# Patient Record
Sex: Female | Born: 1935 | Race: White | Hispanic: No | Smoking: Former smoker
Health system: Southern US, Community
[De-identification: ages and names within clinical notes are randomized; demographics above are authoritative.]

## PROBLEM LIST (undated history)

## (undated) DIAGNOSIS — Z85828 Personal history of other malignant neoplasm of skin: Secondary | ICD-10-CM

## (undated) DIAGNOSIS — Z923 Personal history of irradiation: Secondary | ICD-10-CM

## (undated) DIAGNOSIS — Z9889 Other specified postprocedural states: Secondary | ICD-10-CM

## (undated) DIAGNOSIS — R0602 Shortness of breath: Secondary | ICD-10-CM

## (undated) DIAGNOSIS — M1990 Unspecified osteoarthritis, unspecified site: Secondary | ICD-10-CM

## (undated) DIAGNOSIS — J45909 Unspecified asthma, uncomplicated: Secondary | ICD-10-CM

## (undated) DIAGNOSIS — E7800 Pure hypercholesterolemia, unspecified: Secondary | ICD-10-CM

## (undated) DIAGNOSIS — M5136 Other intervertebral disc degeneration, lumbar region: Secondary | ICD-10-CM

## (undated) DIAGNOSIS — M51369 Other intervertebral disc degeneration, lumbar region without mention of lumbar back pain or lower extremity pain: Secondary | ICD-10-CM

## (undated) DIAGNOSIS — J449 Chronic obstructive pulmonary disease, unspecified: Secondary | ICD-10-CM

## (undated) DIAGNOSIS — C50919 Malignant neoplasm of unspecified site of unspecified female breast: Secondary | ICD-10-CM

## (undated) DIAGNOSIS — Z9289 Personal history of other medical treatment: Secondary | ICD-10-CM

## (undated) DIAGNOSIS — G629 Polyneuropathy, unspecified: Secondary | ICD-10-CM

## (undated) DIAGNOSIS — R112 Nausea with vomiting, unspecified: Secondary | ICD-10-CM

## (undated) DIAGNOSIS — G479 Sleep disorder, unspecified: Secondary | ICD-10-CM

## (undated) HISTORY — PX: TOTAL HIP ARTHROPLASTY: SHX124

## (undated) HISTORY — DX: Malignant neoplasm of unspecified site of unspecified female breast: C50.919

## (undated) HISTORY — DX: Unspecified osteoarthritis, unspecified site: M19.90

## (undated) HISTORY — DX: Personal history of irradiation: Z92.3

## (undated) HISTORY — PX: OTHER SURGICAL HISTORY: SHX169

## (undated) HISTORY — PX: TONSILLECTOMY: SUR1361

---

## 1998-04-05 ENCOUNTER — Encounter: Admission: RE | Admit: 1998-04-05 | Discharge: 1998-07-04 | Payer: Self-pay | Admitting: Internal Medicine

## 1998-06-14 ENCOUNTER — Other Ambulatory Visit: Admission: RE | Admit: 1998-06-14 | Discharge: 1998-06-14 | Payer: Self-pay | Admitting: Internal Medicine

## 1999-07-15 ENCOUNTER — Other Ambulatory Visit: Admission: RE | Admit: 1999-07-15 | Discharge: 1999-07-15 | Payer: Self-pay | Admitting: Internal Medicine

## 1999-09-07 ENCOUNTER — Ambulatory Visit (HOSPITAL_COMMUNITY): Admission: RE | Admit: 1999-09-07 | Discharge: 1999-09-07 | Payer: Self-pay | Admitting: Gastroenterology

## 2000-07-16 ENCOUNTER — Other Ambulatory Visit: Admission: RE | Admit: 2000-07-16 | Discharge: 2000-07-16 | Payer: Self-pay | Admitting: Internal Medicine

## 2002-12-25 HISTORY — PX: COLONOSCOPY W/ POLYPECTOMY: SHX1380

## 2008-12-25 HISTORY — PX: EYE SURGERY: SHX253

## 2009-07-30 ENCOUNTER — Encounter
Admission: RE | Admit: 2009-07-30 | Discharge: 2009-07-30 | Payer: Self-pay | Admitting: Physical Medicine and Rehabilitation

## 2009-08-13 ENCOUNTER — Encounter
Admission: RE | Admit: 2009-08-13 | Discharge: 2009-08-13 | Payer: Self-pay | Admitting: Physical Medicine and Rehabilitation

## 2013-04-08 ENCOUNTER — Other Ambulatory Visit: Payer: Self-pay | Admitting: Radiology

## 2013-04-10 ENCOUNTER — Other Ambulatory Visit: Payer: Self-pay | Admitting: *Deleted

## 2013-04-10 ENCOUNTER — Telehealth: Payer: Self-pay | Admitting: *Deleted

## 2013-04-10 DIAGNOSIS — C50412 Malignant neoplasm of upper-outer quadrant of left female breast: Secondary | ICD-10-CM

## 2013-04-10 DIAGNOSIS — C50419 Malignant neoplasm of upper-outer quadrant of unspecified female breast: Secondary | ICD-10-CM | POA: Insufficient documentation

## 2013-04-10 NOTE — Telephone Encounter (Signed)
Confirmed [facility] for 04/16/13 at 0800 .  Instructions and contact information given.

## 2013-04-16 ENCOUNTER — Encounter: Payer: Self-pay | Admitting: *Deleted

## 2013-04-16 ENCOUNTER — Ambulatory Visit (HOSPITAL_BASED_OUTPATIENT_CLINIC_OR_DEPARTMENT_OTHER): Payer: Medicare Other | Admitting: Oncology

## 2013-04-16 ENCOUNTER — Other Ambulatory Visit: Payer: Self-pay | Admitting: Lab

## 2013-04-16 ENCOUNTER — Ambulatory Visit: Payer: Self-pay

## 2013-04-16 ENCOUNTER — Other Ambulatory Visit (INDEPENDENT_AMBULATORY_CARE_PROVIDER_SITE_OTHER): Payer: Self-pay | Admitting: General Surgery

## 2013-04-16 ENCOUNTER — Encounter (HOSPITAL_COMMUNITY): Payer: Self-pay | Admitting: Pharmacy Technician

## 2013-04-16 ENCOUNTER — Encounter: Payer: Self-pay | Admitting: Oncology

## 2013-04-16 ENCOUNTER — Ambulatory Visit: Payer: Medicare Other | Admitting: Physical Therapy

## 2013-04-16 ENCOUNTER — Encounter (INDEPENDENT_AMBULATORY_CARE_PROVIDER_SITE_OTHER): Payer: Self-pay | Admitting: General Surgery

## 2013-04-16 ENCOUNTER — Ambulatory Visit (HOSPITAL_BASED_OUTPATIENT_CLINIC_OR_DEPARTMENT_OTHER): Payer: Self-pay | Admitting: General Surgery

## 2013-04-16 ENCOUNTER — Ambulatory Visit
Admission: RE | Admit: 2013-04-16 | Discharge: 2013-04-16 | Disposition: A | Discharge status: Home / Self Care | Payer: Medicare Other | Source: Ambulatory Visit | Attending: Radiation Oncology | Admitting: Radiation Oncology

## 2013-04-16 ENCOUNTER — Ambulatory Visit: Payer: Self-pay | Admitting: Oncology

## 2013-04-16 VITALS — BP 182/100 | HR 94 | Temp 97.7°F | Resp 20 | Ht 61.0 in | Wt 138.2 lb

## 2013-04-16 DIAGNOSIS — C50412 Malignant neoplasm of upper-outer quadrant of left female breast: Secondary | ICD-10-CM

## 2013-04-16 DIAGNOSIS — C50419 Malignant neoplasm of upper-outer quadrant of unspecified female breast: Secondary | ICD-10-CM

## 2013-04-16 DIAGNOSIS — J449 Chronic obstructive pulmonary disease, unspecified: Secondary | ICD-10-CM | POA: Insufficient documentation

## 2013-04-16 DIAGNOSIS — J4489 Other specified chronic obstructive pulmonary disease: Secondary | ICD-10-CM

## 2013-04-16 NOTE — Progress Notes (Signed)
Patient ID: Alexandra Snyder, female   DOB: 02/16/1934, 79 y.o.   MRN: [id number]  Chief Complaint  Patient presents with  . Other    breast cancer    HPI Alexandra Snyder is a 79 y.o. female.  Referred by Dr Alexandra Snyder HPI 79 yof who has no prior history of breast complaints who presents after undergoing screening mm that shows area of architectural distortion on the left side.  No changes are noted on the right side.  Ultrasound shows a hypoechoic mass in 1230 position 6 cm from nipple that measures 1.2 cm in diameter.  U/S guided biopsy was performed that shows invasive ductal carcinoma, likely grade II, er positive at 100, pr negative, Ki67 10 and her2 is not amplified.  She comes in today to discuss options.  She does have COPD with prior smoking history and has some shortness of breath when ambulating. She does use proair inhaler daily right now. Past Medical History  Diagnosis Date  . Breast cancer   . Arthritis     History reviewed. No pertinent past surgical history.  Family History  Problem Relation Age of Onset  . Ovarian cancer Sister     Social History History  Substance Use Topics  . Smoking status: Former Smoker  . Smokeless tobacco: Not on file  . Alcohol Use: No    Allergies  Allergen Reactions  . Celebrex (Celecoxib) Itching    Hands itch    Current Outpatient Prescriptions  Medication Sig Dispense Refill  . Albuterol Sulfate (PROAIR HFA IN) Inhale 8.5 g into the lungs 4 (four) times daily. 2 puffs 4 times a day      . Ibuprofen (ADVIL) 200 MG CAPS Take by mouth. Per patient 4 a day      . Mometasone Furo-Formoterol Fum (DULERA IN) Inhale 13 g into the lungs 2 (two) times daily. 1 inhalation twice a day      . OVER THE COUNTER MEDICATION Take by mouth 2 (two) times daily. Vitamin D      . rosuvastatin (CRESTOR) 20 MG tablet Take 20 mg by mouth daily.       No current facility-administered medications for this visit.    Review of Systems Review of  Systems  Constitutional: Negative for fever, chills and unexpected weight change.  HENT: Negative for hearing loss, congestion, sore throat, trouble swallowing and voice change.   Eyes: Negative for visual disturbance.  Respiratory: Positive for shortness of breath. Negative for cough and wheezing.   Cardiovascular: Negative for chest pain, palpitations and leg swelling.  Gastrointestinal: Negative for nausea, vomiting, abdominal pain, diarrhea, constipation, blood in stool, abdominal distention and anal bleeding.  Genitourinary: Negative for hematuria, vaginal bleeding and difficulty urinating.  Musculoskeletal: Negative for arthralgias.  Skin: Negative for rash and wound.  Neurological: Negative for seizures, syncope and headaches.  Hematological: Negative for adenopathy. Does not bruise/bleed easily.  Psychiatric/Behavioral: Negative for confusion.    There were no vitals taken for this visit.  Physical Exam Physical Exam  Vitals reviewed. Constitutional: She appears well-developed and well-nourished.  Eyes: No scleral icterus.  Neck: Neck supple.  Cardiovascular: Normal rate, regular rhythm and normal heart sounds.   Pulmonary/Chest: Effort normal and breath sounds normal. She has no wheezes. Right breast exhibits no inverted nipple, no mass, no nipple discharge and no skin change. Left breast exhibits no inverted nipple, no mass, no nipple discharge and no skin change.  Lymphadenopathy:    She has no cervical adenopathy.      She has no axillary adenopathy.       Right: No supraclavicular adenopathy present.       Left: No supraclavicular adenopathy present.    Data Reviewed Pathology, radiology studies all reviewed  Assessment    Left breast cancer, clinical stage I    Plan    Left breast wire guided lumpectomy, will omit sentinel node biopsy as this will not change treatment and both Dr. Khan and Dr. Moody are in agreement   We discussed the staging and  pathophysiology of breast cancer. We discussed all of the different options for treatment for breast cancer including surgery, chemotherapy, radiation therapy, Herceptin, and antiestrogen therapy.   We are going to omit sentinel node biopsy after being discussed in multidisciplinary fashion. We discussed the options for treatment of the breast cancer which included lumpectomy versus a mastectomy. We discussed the performance of the lumpectomy with a wire placement. We discussed a 10-20% chance of a positive margin requiring reexcision in the operating room. We also discussed that she may need radiation therapy or antiestrogen therapy or both if she undergoes lumpectomy. We discussed the mastectomy and the postoperative care for that as well. We discussed that there is no difference in her survival whether she undergoes lumpectomy with radiation therapy or antiestrogen therapy versus a mastectomy. There is a slight difference in the local recurrence rate being 3-5% with lumpectomy and about 1% with a mastectomy. We discussed the risks of operation including bleeding, infection, possible reoperation. She understands her further therapy will be based on what her stages at the time of her operation.          Alexandra Snyder 04/16/2013, 4:47 PM    

## 2013-04-18 ENCOUNTER — Encounter: Payer: Self-pay | Admitting: *Deleted

## 2013-04-18 ENCOUNTER — Encounter (HOSPITAL_COMMUNITY)
Admission: RE | Admit: 2013-04-18 | Discharge: 2013-04-18 | Disposition: A | Discharge status: Home / Self Care | Payer: Medicare Other | Source: Ambulatory Visit | Attending: General Surgery | Admitting: General Surgery

## 2013-04-18 ENCOUNTER — Encounter (HOSPITAL_COMMUNITY): Payer: Self-pay

## 2013-04-18 ENCOUNTER — Telehealth: Payer: Self-pay | Admitting: *Deleted

## 2013-04-18 DIAGNOSIS — C50412 Malignant neoplasm of upper-outer quadrant of left female breast: Secondary | ICD-10-CM

## 2013-04-18 HISTORY — DX: Chronic obstructive pulmonary disease, unspecified: J44.9

## 2013-04-18 HISTORY — DX: Unspecified asthma, uncomplicated: J45.909

## 2013-04-18 LAB — CBC WITH DIFFERENTIAL/PLATELET
Basophils Relative: 1 % (ref 0–1)
Eosinophils Absolute: 0.2 10*3/uL (ref 0.0–0.7)
MCH: 28.3 pg (ref 26.0–34.0)
MCHC: 34.1 g/dL (ref 30.0–36.0)
Neutrophils Relative %: 50 % (ref 43–77)
Platelets: 282 10*3/uL (ref 150–400)
RBC: 4.35 MIL/uL (ref 3.87–5.11)

## 2013-04-18 LAB — BASIC METABOLIC PANEL
BUN: 16 mg/dL (ref 6–23)
Calcium: 9.9 mg/dL (ref 8.4–10.5)
GFR calc non Af Amer: 66 mL/min — ABNORMAL LOW (ref >90)
Glucose, Bld: 103 mg/dL — ABNORMAL HIGH (ref 70–99)
Sodium: 142 mEq/L (ref 135–145)

## 2013-04-18 LAB — SURGICAL PCR SCREEN: MRSA, PCR: NEGATIVE

## 2013-04-18 NOTE — Telephone Encounter (Signed)
I called patient to f/u from [facility] on 04/16/13.  Patient completed preadmission testing this AM for scheduled surgery 04/21/13.  We reviewed her f/u appointments with Dr. Mitzi Hansen on 05/05/13 and Dr. Welton Flakes on 05/06/13.  Patient was able to repeat information.  Patient denied any other questions at this time.  Patient verified that she has my contact information and was encouraged to call for any questions, concerns or needs.

## 2013-04-20 MED ORDER — CEFAZOLIN SODIUM-DEXTROSE 2-3 GM-% IV SOLR: 2.0000 g | INTRAVENOUS | Status: DC

## 2013-04-21 ENCOUNTER — Encounter (HOSPITAL_COMMUNITY): Payer: Self-pay | Admitting: *Deleted

## 2013-04-21 ENCOUNTER — Observation Stay (HOSPITAL_COMMUNITY)
Admission: RE | Admit: 2013-04-21 | Discharge: 2013-04-22 | Disposition: A | Discharge status: Home / Self Care | Payer: Medicare Other | Source: Ambulatory Visit | Attending: General Surgery | Admitting: General Surgery

## 2013-04-21 ENCOUNTER — Encounter: Payer: Self-pay | Admitting: *Deleted

## 2013-04-21 ENCOUNTER — Ambulatory Visit (HOSPITAL_COMMUNITY): Payer: Medicare Other | Admitting: Anesthesiology

## 2013-04-21 ENCOUNTER — Telehealth: Payer: Self-pay | Admitting: Oncology

## 2013-04-21 ENCOUNTER — Encounter (HOSPITAL_COMMUNITY)
Admission: RE | Disposition: A | Discharge status: Home / Self Care | Payer: Self-pay | Source: Ambulatory Visit | Attending: General Surgery

## 2013-04-21 ENCOUNTER — Encounter (HOSPITAL_COMMUNITY): Payer: Self-pay | Admitting: Anesthesiology

## 2013-04-21 DIAGNOSIS — Z01812 Encounter for preprocedural laboratory examination: Secondary | ICD-10-CM | POA: Insufficient documentation

## 2013-04-21 DIAGNOSIS — J4489 Other specified chronic obstructive pulmonary disease: Secondary | ICD-10-CM | POA: Insufficient documentation

## 2013-04-21 DIAGNOSIS — D0590 Unspecified type of carcinoma in situ of unspecified breast: Secondary | ICD-10-CM | POA: Insufficient documentation

## 2013-04-21 DIAGNOSIS — Z01818 Encounter for other preprocedural examination: Secondary | ICD-10-CM | POA: Insufficient documentation

## 2013-04-21 DIAGNOSIS — Z602 Problems related to living alone: Secondary | ICD-10-CM | POA: Insufficient documentation

## 2013-04-21 DIAGNOSIS — I10 Essential (primary) hypertension: Secondary | ICD-10-CM | POA: Insufficient documentation

## 2013-04-21 DIAGNOSIS — Z01810 Encounter for preprocedural cardiovascular examination: Secondary | ICD-10-CM | POA: Insufficient documentation

## 2013-04-21 DIAGNOSIS — J449 Chronic obstructive pulmonary disease, unspecified: Secondary | ICD-10-CM | POA: Insufficient documentation

## 2013-04-21 DIAGNOSIS — C50419 Malignant neoplasm of upper-outer quadrant of unspecified female breast: Principal | ICD-10-CM | POA: Insufficient documentation

## 2013-04-21 HISTORY — PX: BREAST LUMPECTOMY WITH NEEDLE LOCALIZATION: SHX5759

## 2013-04-21 SURGERY — BREAST LUMPECTOMY WITH NEEDLE LOCALIZATION
Anesthesia: General | Site: Breast | Laterality: Left | Wound class: Clean

## 2013-04-21 MED ORDER — ALBUTEROL SULFATE HFA 108 (90 BASE) MCG/ACT IN AERS
2.0000 | INHALATION_SPRAY | Freq: Four times a day (QID) | RESPIRATORY_TRACT | Status: DC | PRN
  Administered 2013-04-22: 2 via RESPIRATORY_TRACT
  Filled 2013-04-21: qty 6.7

## 2013-04-21 MED ORDER — FENTANYL CITRATE 0.05 MG/ML IJ SOLN
INTRAMUSCULAR | Status: DC | PRN
  Administered 2013-04-21: 50 ug via INTRAVENOUS

## 2013-04-21 MED ORDER — 0.9 % SODIUM CHLORIDE (POUR BTL) OPTIME
TOPICAL | Status: DC | PRN
  Administered 2013-04-21: 1000 mL

## 2013-04-21 MED ORDER — BUPIVACAINE HCL (PF) 0.25 % IJ SOLN
INTRAMUSCULAR | Status: DC | PRN
  Administered 2013-04-21: 14 mL

## 2013-04-21 MED ORDER — LACTATED RINGERS IV SOLN
INTRAVENOUS | Status: DC | PRN
  Administered 2013-04-21: 13:00:00 via INTRAVENOUS

## 2013-04-21 MED ORDER — MORPHINE SULFATE 2 MG/ML IJ SOLN: 1.0000 mg | INTRAMUSCULAR | Status: DC | PRN

## 2013-04-21 MED ORDER — BUPIVACAINE HCL (PF) 0.25 % IJ SOLN
INTRAMUSCULAR | Status: AC
  Filled 2013-04-21: qty 30

## 2013-04-21 MED ORDER — BUPIVACAINE-EPINEPHRINE PF 0.25-1:200000 % IJ SOLN
INTRAMUSCULAR | Status: AC
  Filled 2013-04-21: qty 30

## 2013-04-21 MED ORDER — OXYCODONE HCL 5 MG/5ML PO SOLN: 5.0000 mg | Freq: Once | ORAL | Status: DC | PRN

## 2013-04-21 MED ORDER — MOMETASONE FURO-FORMOTEROL FUM 100-5 MCG/ACT IN AERO
2.0000 | INHALATION_SPRAY | Freq: Two times a day (BID) | RESPIRATORY_TRACT | Status: DC | PRN
  Filled 2013-04-21: qty 8.8

## 2013-04-21 MED ORDER — CEFAZOLIN SODIUM-DEXTROSE 2-3 GM-% IV SOLR
INTRAVENOUS | Status: AC
  Administered 2013-04-21: 2 g via INTRAVENOUS
  Filled 2013-04-21: qty 50

## 2013-04-21 MED ORDER — MORPHINE SULFATE 2 MG/ML IJ SOLN: 2.0000 mg | INTRAMUSCULAR | Status: DC | PRN

## 2013-04-21 MED ORDER — ONDANSETRON HCL 4 MG/2ML IJ SOLN
INTRAMUSCULAR | Status: DC | PRN
  Administered 2013-04-21: 4 mg via INTRAVENOUS

## 2013-04-21 MED ORDER — SODIUM CHLORIDE 0.9 % IV SOLN
INTRAVENOUS | Status: DC
  Administered 2013-04-21: 17:00:00 via INTRAVENOUS

## 2013-04-21 MED ORDER — OXYCODONE HCL 5 MG PO TABS: 5.0000 mg | ORAL_TABLET | ORAL | Status: DC | PRN

## 2013-04-21 MED ORDER — ONDANSETRON HCL 4 MG/2ML IJ SOLN: 4.0000 mg | Freq: Four times a day (QID) | INTRAMUSCULAR | Status: DC | PRN

## 2013-04-21 MED ORDER — MEPERIDINE HCL 25 MG/ML IJ SOLN: 6.2500 mg | INTRAMUSCULAR | Status: DC | PRN

## 2013-04-21 MED ORDER — PROPOFOL 10 MG/ML IV BOLUS
INTRAVENOUS | Status: DC | PRN
  Administered 2013-04-21: 120 mg via INTRAVENOUS
  Administered 2013-04-21: 30 mg via INTRAVENOUS

## 2013-04-21 MED ORDER — PROMETHAZINE HCL 25 MG/ML IJ SOLN: 6.2500 mg | INTRAMUSCULAR | Status: DC | PRN

## 2013-04-21 MED ORDER — LIDOCAINE HCL (CARDIAC) 20 MG/ML IV SOLN
INTRAVENOUS | Status: DC | PRN
  Administered 2013-04-21: 60 mg via INTRAVENOUS

## 2013-04-21 MED ORDER — IBUPROFEN 200 MG PO TABS
200.0000 mg | ORAL_TABLET | Freq: Four times a day (QID) | ORAL | Status: DC | PRN
  Filled 2013-04-21: qty 1

## 2013-04-21 MED ORDER — OXYCODONE HCL 5 MG PO TABS: 5.0000 mg | ORAL_TABLET | Freq: Once | ORAL | Status: DC | PRN

## 2013-04-21 SURGICAL SUPPLY — 53 items
ADH SKN CLS APL DERMABOND .7 (GAUZE/BANDAGES/DRESSINGS)
ADH SKN CLS LQ APL DERMABOND (GAUZE/BANDAGES/DRESSINGS) ×1
APPLIER CLIP 9.375 MED OPEN (MISCELLANEOUS) ×2
APR CLP MED 9.3 20 MLT OPN (MISCELLANEOUS) ×1
BINDER BREAST LRG (GAUZE/BANDAGES/DRESSINGS) ×2 IMPLANT
BINDER BREAST XLRG (GAUZE/BANDAGES/DRESSINGS) IMPLANT
BLADE SURG 15 STRL LF DISP TIS (BLADE) ×1 IMPLANT
BLADE SURG 15 STRL SS (BLADE) ×2
CANISTER SUCTION 2500CC (MISCELLANEOUS) ×2 IMPLANT
CHLORAPREP W/TINT 26ML (MISCELLANEOUS) ×2 IMPLANT
CLIP APPLIE 9.375 MED OPEN (MISCELLANEOUS) ×1 IMPLANT
CLOTH BEACON ORANGE TIMEOUT ST (SAFETY) ×2 IMPLANT
COVER SURGICAL LIGHT HANDLE (MISCELLANEOUS) ×2 IMPLANT
DECANTER SPIKE VIAL GLASS SM (MISCELLANEOUS) ×2 IMPLANT
DERMABOND ADHESIVE PROPEN (GAUZE/BANDAGES/DRESSINGS) ×1
DERMABOND ADVANCED (GAUZE/BANDAGES/DRESSINGS)
DERMABOND ADVANCED .7 DNX12 (GAUZE/BANDAGES/DRESSINGS) IMPLANT
DERMABOND ADVANCED .7 DNX6 (GAUZE/BANDAGES/DRESSINGS) ×1 IMPLANT
DEVICE DUBIN SPECIMEN MAMMOGRA (MISCELLANEOUS) ×2 IMPLANT
DRAPE CHEST BREAST 15X10 FENES (DRAPES) ×2 IMPLANT
ELECT CAUTERY BLADE 6.4 (BLADE) ×2 IMPLANT
ELECT REM PT RETURN 9FT ADLT (ELECTROSURGICAL) ×2
ELECTRODE REM PT RTRN 9FT ADLT (ELECTROSURGICAL) ×1 IMPLANT
GLOVE BIO SURGEON STRL SZ7 (GLOVE) ×6 IMPLANT
GLOVE BIO SURGEON STRL SZ7.5 (GLOVE) ×2 IMPLANT
GLOVE BIOGEL PI IND STRL 7.0 (GLOVE) ×1 IMPLANT
GLOVE BIOGEL PI IND STRL 7.5 (GLOVE) ×3 IMPLANT
GLOVE BIOGEL PI INDICATOR 7.0 (GLOVE) ×1
GLOVE BIOGEL PI INDICATOR 7.5 (GLOVE) ×3
GOWN STRL NON-REIN LRG LVL3 (GOWN DISPOSABLE) ×6 IMPLANT
KIT BASIN OR (CUSTOM PROCEDURE TRAY) ×2 IMPLANT
KIT MARKER MARGIN INK (KITS) ×2 IMPLANT
KIT ROOM TURNOVER OR (KITS) ×2 IMPLANT
NEEDLE HYPO 25GX1X1/2 BEV (NEEDLE) ×2 IMPLANT
NS IRRIG 1000ML POUR BTL (IV SOLUTION) ×2 IMPLANT
PACK SURGICAL SETUP 50X90 (CUSTOM PROCEDURE TRAY) ×2 IMPLANT
PAD ARMBOARD 7.5X6 YLW CONV (MISCELLANEOUS) ×2 IMPLANT
PENCIL BUTTON HOLSTER BLD 10FT (ELECTRODE) ×2 IMPLANT
SPONGE LAP 18X18 X RAY DECT (DISPOSABLE) ×2 IMPLANT
STAPLER VISISTAT 35W (STAPLE) ×2 IMPLANT
STRIP CLOSURE SKIN 1/2X4 (GAUZE/BANDAGES/DRESSINGS) ×2 IMPLANT
SUT MNCRL AB 4-0 PS2 18 (SUTURE) ×2 IMPLANT
SUT SILK 2 0 SH (SUTURE) IMPLANT
SUT VIC AB 2-0 SH 27 (SUTURE) ×2
SUT VIC AB 2-0 SH 27XBRD (SUTURE) ×1 IMPLANT
SUT VIC AB 3-0 SH 27 (SUTURE) ×2
SUT VIC AB 3-0 SH 27X BRD (SUTURE) ×1 IMPLANT
SYR BULB 3OZ (MISCELLANEOUS) ×2 IMPLANT
SYR CONTROL 10ML LL (SYRINGE) ×2 IMPLANT
TOWEL OR 17X24 6PK STRL BLUE (TOWEL DISPOSABLE) ×2 IMPLANT
TOWEL OR 17X26 10 PK STRL BLUE (TOWEL DISPOSABLE) ×2 IMPLANT
TUBE CONNECTING 12X1/4 (SUCTIONS) ×2 IMPLANT
YANKAUER SUCT BULB TIP NO VENT (SUCTIONS) ×2 IMPLANT

## 2013-04-21 NOTE — Interval H&P Note (Signed)
History and Physical Interval Note:  04/21/2013 1:03 PM  Alexandra Snyder  has presented today for surgery, with the diagnosis of LEFT BREAST CANCER  The various methods of treatment have been discussed with the patient and family. After consideration of risks, benefits and other options for treatment, the patient has consented to  Procedure(s): LEFT BREAST WIRE GUIDED  LUMPECTOMY  (Left) as a surgical intervention .  The patient's history has been reviewed, patient examined, no change in status, stable for surgery.  I have reviewed the patient's chart and labs.  Questions were answered to the patient's satisfaction.     [name]

## 2013-04-21 NOTE — Telephone Encounter (Signed)
lmonvm advising the pt of her may 2014 appts for lab and md visit.

## 2013-04-21 NOTE — Anesthesia Postprocedure Evaluation (Signed)
  Anesthesia Post-op Note  Patient: Alexandra Snyder  Procedure(s) Performed: Procedure(s): LEFT BREAST WIRE GUIDED  LUMPECTOMY  (Left)  Patient Location: PACU  Anesthesia Type:General  Level of Consciousness: awake, oriented and patient cooperative  Airway and Oxygen Therapy: Patient Spontanous Breathing  Post-op Pain: mild  Post-op Assessment: Post-op Vital signs reviewed, Patient's Cardiovascular Status Stable, Respiratory Function Stable, Patent Airway, No signs of Nausea or vomiting and Pain level controlled  Post-op Vital Signs: stable  Complications: No apparent anesthesia complications

## 2013-04-21 NOTE — Anesthesia Preprocedure Evaluation (Addendum)
Anesthesia Evaluation  Patient identified by MRN, date of birth, ID band Patient awake    Reviewed: Allergy & Precautions, H&P , NPO status , Patient's Chart, lab work & pertinent test results  Airway Mallampati: I  Neck ROM: Full    Dental  (+) Upper Dentures   Pulmonary asthma , COPD breath sounds clear to auscultation        Cardiovascular hypertension, negative cardio ROS  Rhythm:Regular Rate:Normal     Neuro/Psych    GI/Hepatic Neg liver ROS, GERD-  ,  Endo/Other  negative endocrine ROS  Renal/GU negative Renal ROS     Musculoskeletal   Abdominal   Peds  Hematology negative hematology ROS (+)   Anesthesia Other Findings   Reproductive/Obstetrics                          Anesthesia Physical Anesthesia Plan  ASA: III  Anesthesia Plan: General   Post-op Pain Management:    Induction: Intravenous  Airway Management Planned: LMA  Additional Equipment:   Intra-op Plan:   Post-operative Plan: Extubation in OR  Informed Consent: I have reviewed the patients History and Physical, chart, labs and discussed the procedure including the risks, benefits and alternatives for the proposed anesthesia with the patient or authorized representative who has indicated his/her understanding and acceptance.     Plan Discussed with: CRNA and Surgeon  Anesthesia Plan Comments:         Anesthesia Quick Evaluation

## 2013-04-21 NOTE — Progress Notes (Signed)
Called to tx pt to rm. Rn will call back.

## 2013-04-21 NOTE — Op Note (Signed)
Preoperative diagnosis: Clinical stage I left breast cancer Postoperative diagnosis: Same as above Procedure: Left breast wire-guided lumpectomy Surgeon: Dr. Harden Mo Anesthesia: Gen. With LMA Complications: None Drains: None Estimated blood loss: Minimal Specimens: Left breast tissue marked with paint Sponge needle count correct at end of operation Disposition to recovery in stable condition  Indications: This is a 79 year old female who underwent her screening mammogram was found to have a left breast lesion. This underwent biopsy and was shown to be invasive ductal carcinoma. She underwent evaluation in our multidisciplinary clinic. In conjunction with the patient we all decided to proceed with a lumpectomy and omit a sentinel lymph node biopsy. We discussed the risks and benefits of this prior to beginning.  Procedure: [facility] she had a wire placed by Dr. Juanetta Gosling. I had the mammograms in the operating room. After informed consent was obtained she was taken to the operating room. She was administered 2 g of intravenous cefazolin. Sequential compression devices were on her legs. She was then placed under general anesthesia without complication. Her left breast was prepped and draped in the standard sterile surgical fashion. A surgical timeout was performed.  I did use the ultrasound to identify this area prior to beginning. I then made a curvilinear incision overlying the lesion. I used cautery to excise the lesion. I brought the wire into from its remote position. I excised this entire area. I performed a mammogram in the operating room and confirmed removal of the clip and the wire. A clip was immediately in the middle of the specimen. This was confirmed by radiology. Hemostasis was obtained. I placed 2 clips deep and one clip in each of the positions around the cavity. I then closed the breast tissue a 2-0 Vicryl. The dermis was closed with 3-0 Vicryl  and the skin with 4-0 Monocryl. I injected Marcaine throughout the cavity. I then used Dermabond and Steri-Strips. A breast binder was placed. She tolerated this well was extubated and transferred to recovery stable.

## 2013-04-21 NOTE — H&P (View-Only) (Signed)
Patient ID: Alexandra Snyder, female   DOB: 01/21/1934, 79 y.o.   MRN: [id number]  Chief Complaint  Patient presents with  . Other    breast cancer    HPI Alexandra Snyder is a 79 y.o. female.  Referred by Dr Kellie Shropshire HPI 39 yof who has no prior history of breast complaints who presents after undergoing screening mm that shows area of architectural distortion on the left side.  No changes are noted on the right side.  Ultrasound shows a hypoechoic mass in 1230 position 6 cm from nipple that measures 1.2 cm in diameter.  U/S guided biopsy was performed that shows invasive ductal carcinoma, likely grade II, er positive at 100, pr negative, Ki67 10 and her2 is not amplified.  She comes in today to discuss options.  She does have COPD with prior smoking history and has some shortness of breath when ambulating. She does use proair inhaler daily right now. Past Medical History  Diagnosis Date  . Breast cancer   . Arthritis     History reviewed. No pertinent past surgical history.  Family History  Problem Relation Age of Onset  . Ovarian cancer Sister     Social History History  Substance Use Topics  . Smoking status: Former Games developer  . Smokeless tobacco: Not on file  . Alcohol Use: No    Allergies  Allergen Reactions  . Celebrex (Celecoxib) Itching    Hands itch    Current Outpatient Prescriptions  Medication Sig Dispense Refill  . Albuterol Sulfate (PROAIR HFA IN) Inhale 8.5 g into the lungs 4 (four) times daily. 2 puffs 4 times a day      . Ibuprofen (ADVIL) 200 MG CAPS Take by mouth. Per patient 4 a day      . Mometasone Furo-Formoterol Fum (DULERA IN) Inhale 13 g into the lungs 2 (two) times daily. 1 inhalation twice a day      . OVER THE COUNTER MEDICATION Take by mouth 2 (two) times daily. Vitamin D      . rosuvastatin (CRESTOR) 20 MG tablet Take 20 mg by mouth daily.       No current facility-administered medications for this visit.    Review of Systems Review of  Systems  Constitutional: Negative for fever, chills and unexpected weight change.  HENT: Negative for hearing loss, congestion, sore throat, trouble swallowing and voice change.   Eyes: Negative for visual disturbance.  Respiratory: Positive for shortness of breath. Negative for cough and wheezing.   Cardiovascular: Negative for chest pain, palpitations and leg swelling.  Gastrointestinal: Negative for nausea, vomiting, abdominal pain, diarrhea, constipation, blood in stool, abdominal distention and anal bleeding.  Genitourinary: Negative for hematuria, vaginal bleeding and difficulty urinating.  Musculoskeletal: Negative for arthralgias.  Skin: Negative for rash and wound.  Neurological: Negative for seizures, syncope and headaches.  Hematological: Negative for adenopathy. Does not bruise/bleed easily.  Psychiatric/Behavioral: Negative for confusion.    There were no vitals taken for this visit.  Physical Exam Physical Exam  Vitals reviewed. Constitutional: She appears well-developed and well-nourished.  Eyes: No scleral icterus.  Neck: Neck supple.  Cardiovascular: Normal rate, regular rhythm and normal heart sounds.   Pulmonary/Chest: Effort normal and breath sounds normal. She has no wheezes. Right breast exhibits no inverted nipple, no mass, no nipple discharge and no skin change. Left breast exhibits no inverted nipple, no mass, no nipple discharge and no skin change.  Lymphadenopathy:    She has no cervical adenopathy.  She has no axillary adenopathy.       Right: No supraclavicular adenopathy present.       Left: No supraclavicular adenopathy present.    Data Reviewed Pathology, radiology studies all reviewed  Assessment    Left breast cancer, clinical stage I    Plan    Left breast wire guided lumpectomy, will omit sentinel node biopsy as this will not change treatment and both Dr. Welton Flakes and Dr. Mitzi Hansen are in agreement   We discussed the staging and  pathophysiology of breast cancer. We discussed all of the different options for treatment for breast cancer including surgery, chemotherapy, radiation therapy, Herceptin, and antiestrogen therapy.   We are going to omit sentinel node biopsy after being discussed in multidisciplinary fashion. We discussed the options for treatment of the breast cancer which included lumpectomy versus a mastectomy. We discussed the performance of the lumpectomy with a wire placement. We discussed a 10-20% chance of a positive margin requiring reexcision in the operating room. We also discussed that she may need radiation therapy or antiestrogen therapy or both if she undergoes lumpectomy. We discussed the mastectomy and the postoperative care for that as well. We discussed that there is no difference in her survival whether she undergoes lumpectomy with radiation therapy or antiestrogen therapy versus a mastectomy. There is a slight difference in the local recurrence rate being 3-5% with lumpectomy and about 1% with a mastectomy. We discussed the risks of operation including bleeding, infection, possible reoperation. She understands her further therapy will be based on what her stages at the time of her operation.          Cecilia Nishikawa 04/16/2013, 4:47 PM

## 2013-04-21 NOTE — Anesthesia Procedure Notes (Signed)
Procedure Name: LMA Insertion Date/Time: 04/21/2013 1:15 PM Performed by: Arlice Colt B Pre-anesthesia Checklist: Patient identified, Emergency Drugs available, Suction available, Patient being monitored and Timeout performed Patient Re-evaluated:Patient Re-evaluated prior to inductionOxygen Delivery Method: Circle system utilized Preoxygenation: Pre-oxygenation with 100% oxygen LMA: LMA inserted LMA Size: 4.0 Number of attempts: 1 Placement Confirmation: positive ETCO2 and breath sounds checked- equal and bilateral Tube secured with: Tape Dental Injury: Teeth and Oropharynx as per pre-operative assessment

## 2013-04-21 NOTE — Transfer of Care (Signed)
Immediate Anesthesia Transfer of Care Note  Patient: Alexandra Snyder  Procedure(s) Performed: Procedure(s): LEFT BREAST WIRE GUIDED  LUMPECTOMY  (Left)  Patient Location: PACU  Anesthesia Type:General  Level of Consciousness: awake, alert  and oriented  Airway & Oxygen Therapy: Patient Spontanous Breathing  Post-op Assessment: Report given to PACU RN and Post -op Vital signs reviewed and stable  Post vital signs: Reviewed and stable  Complications: No apparent anesthesia complications

## 2013-04-21 NOTE — Progress Notes (Signed)
Radiation Oncology         [phone]) [phone] ________________________________  Name: KARYL SHARRAR MRN: [id number]  Date: 04/16/2013  DOB: 08/13/1934  JY:[id number] R., MD  Emelia Loron, MD   Drue Second, M.D.  REFERRING PHYSICIAN: Emelia Loron, MD   DIAGNOSIS: The encounter diagnosis was Cancer of upper-outer quadrant of female breast, left.   HISTORY OF PRESENT ILLNESS::Alexandra Snyder is a 79 y.o. female who is seen for an initial consultation visit. The patient was noted to have an abnormality on recent screening mammogram showed some architectural distortion within the left breast. No changes were noted on the right. The patient therefore proceeded to undergo an ultrasound which showed a hypoechoic mass at the 12:30 o'clock position within the left breast. This was 6 cm from the nipple and this measured 1.2 cm in diameter.  The patient proceeded to undergo an ultrasound guided biopsy. This revealed invasive ductal carcinoma, grade 2, which was ER positive, PR negative, and HER-2/neu negative. The Ki-67 staining was 10%. The patient's case was discussed in multidisciplinary breast conference this morning and she presents to multidisciplinary clinic today. I been asked to see her for evaluation for radiotherapy as part of her overall treatment plan.     PREVIOUS RADIATION THERAPY: No   PAST MEDICAL HISTORY:  has a past medical history of Breast cancer; Arthritis; Asthma; COPD (chronic obstructive pulmonary disease); and GERD (gastroesophageal reflux disease).     PAST SURGICAL HISTORY: Past Surgical History  Procedure Laterality Date  . Eye surgery Bilateral     Cataract removal  . Colonoscopy w/ polypectomy    . Tonsillectomy       FAMILY HISTORY: family history includes Ovarian cancer in her sister.   SOCIAL HISTORY:  reports that she has quit smoking. She does not have any smokeless tobacco history on file. She reports that she does not drink alcohol or use  illicit drugs.   ALLERGIES: Celebrex   MEDICATIONS:  No current outpatient prescriptions on file.   No current facility-administered medications for this encounter.     REVIEW OF SYSTEMS:  A 15 point review of systems is documented in the electronic medical record. This was obtained by the nursing staff. However, I reviewed this with the patient to discuss relevant findings and make appropriate changes.  Pertinent items are noted in HPI.    PHYSICAL EXAM:  vitals were not taken for this visit.  General: Well-developed, in no acute distress HEENT: Normocephalic, atraumatic; oral cavity clear Neck: Supple without any lymphadenopathy Cardiovascular: Regular rate and rhythm Respiratory: Clear to auscultation bilaterally Breasts: Status post biopsy with a lateral biopsy site within the left breast. No skin changes, nipple changes or discharge. No mass present on the left and no axillary adenopathy present on the left. Unremarkable breast exam on the right and negative axilla clinically. GI: Soft, nontender, normal bowel sounds Extremities: No edema present Neuro: No focal deficits     LABORATORY DATA:  Lab Results  Component Value Date   WBC 8.6 04/18/2013   HGB 12.3 04/18/2013   HCT 36.1 04/18/2013   MCV 83.0 04/18/2013   PLT 282 04/18/2013   Lab Results  Component Value Date   NA 142 04/18/2013   K 4.0 04/18/2013   CL 104 04/18/2013   CO2 29 04/18/2013   No results found for this basename: ALT, AST, GGT, ALKPHOS, BILITOT      RADIOGRAPHY: Dg Chest 2 View  04/18/2013  *RADIOLOGY REPORT*  Clinical Data: Preoperative chest  x-ray, history of breast cancer, smoker  CHEST - 2 VIEW  Comparison: None.  Findings: The lungs are hyperinflated and there are central bronchitic changes suggesting underlying COPD.  Additionally, the upper lungs appear emphysematous.  No focal airspace consolidation, pulmonary edema, pleural effusion or pneumothorax.  No suspicious pulmonary nodule.  Cardiac  and mediastinal contours within normal limits.  There is mild aortic atherosclerotic calcification. Dextroconvex scoliosis of the lumbar spine incompletely imaged. No acute osseous abnormality.  IMPRESSION:  1.  No acute cardiopulmonary disease 2.  COPD/emphysema 3.  Mild aortic atherosclerosis   Original Report Authenticated By: Malachy Moan, M.D.        IMPRESSION: The patient is presenting with a stage I invasive ductal carcinoma of the left breast. She is felt to be a good candidate for breast conservation treatment.   I discussed the potential role of radiotherapy in her setting. She is also being seen by medical oncology today to discuss systemic treatment. We did discuss the potential role for sentinel lymph node biopsy and also the risks with this procedure. The patient has a favorable tumor and much of our discussion in clinic was based on trying to find the correct balance between treatment and minimizing the role of side effects. The patient's health remains reasonably good. We therefore discussed one option which would be to proceed with a four-week course of adjuvant radiotherapy, and not to proceed with a sentinel lymph node biopsy. Given the location of her tumor and typical radiotherapy fields, the axilla would be covered to some degree with anticipated tangent radiotherapy fields.   PLAN:  -The patient is to proceed with lumpectomy as scheduled by Dr. Dwain Sarna. No sentinel lymph node biopsy.  -I will see the patient back postoperatively to review her surgical results and to further discuss and coordinate an anticipated course of adjuvant radiotherapy. Again, I would anticipate a four-week course of treatment.   The patient is also anticipated to receive the anti-hormonal treatment through Dr. Welton Flakes.-   I spent 60 minutes minutes face to face with the patient and more than 50% of that time was spent in counseling and/or coordination of care.     ________________________________   Radene Gunning, MD, PhD

## 2013-04-21 NOTE — Progress Notes (Signed)
[facility] Psychosocial Distress Screening Clinical Social Work  Patient completed distress screening protocol, and scored a 0 on the Psychosocial Distress Thermometer which indicates no distress. Clinical Social Worker met with pt in [facility] to assess for distress and other psychosocial needs.  Pt stated she felt confident in her treatment plan after speaking with the physicians and getting more information.  CSW informed pt of the support team and support services at [facility], and encouraged pt to call with any additional questions or concerns.    Tamala Julian, MSW, LCSW Clinical Social Worker [facility] [phone]

## 2013-04-22 ENCOUNTER — Encounter (HOSPITAL_COMMUNITY): Payer: Self-pay | Admitting: General Surgery

## 2013-04-22 MED ORDER — OXYCODONE HCL 5 MG PO TABS: 5.0000 mg | ORAL_TABLET | ORAL | Status: DC | PRN

## 2013-04-22 NOTE — Discharge Summary (Signed)
Physician Discharge Summary  Patient ID: Alexandra Snyder MRN: [id number] DOB/AGE: 1934-08-25 79 y.o.  Admit date: 04/21/2013 Discharge date: 04/22/2013  Admission Diagnoses: COPD Left breast cancer Discharge Diagnoses:  Active Problems:   * No active hospital problems. *   Discharged Condition: good  Hospital Course: 86 yof with history of copd admitted and underwent left breast lumpectomy for breast cancer.  She remained overnight as she lives alone and to monitor copd.  She is doing well first postoperative day and is at her baseline.  Consults: None  Significant Diagnostic Studies: none  Treatments: surgery: left breast lumpectomy  Discharge Exam: Blood pressure 141/71, pulse 75, temperature 98 F (36.7 C), temperature source Oral, resp. rate 18, height 5\' 1"  (1.549 m), SpO2 98.00%. Incision/Wound:clean without infection  Disposition: Home   Future Appointments Provider Department Dept Phone   05/05/2013 10:00 AM [facility] RAD[facility]ATION ONCOLOGY [phone]   05/05/2013 10:30 AM Jonna Coup, MD [facility] RADIATION ONCOLOGY [phone]   05/06/2013 10:30 AM Delcie Roch [facility] MEDICAL ONCOLOGY [phone]   05/06/2013 11:00 AM Victorino December, MD  [facility] [phone]       Medication List    TAKE these medications       acetaminophen 325 MG tablet  Commonly known as:  TYLENOL  Take 650 mg by mouth every 6 (six) hours as needed for pain or fever.     albuterol 108 (90 BASE) MCG/ACT inhaler  Commonly known as:  PROVENTIL HFA;VENTOLIN HFA  Inhale 2 puffs into the lungs every 6 (six) hours as needed for wheezing.     cholecalciferol 1000 UNITS tablet  Commonly known as:  VITAMIN D  Take 1,000 Units by mouth 2 (two) times daily.     ibuprofen 200 MG tablet  Commonly known as:  ADVIL,MOTRIN  Take 200 mg by mouth every 6 (six) hours as needed for pain, fever or  headache.     mometasone-formoterol 100-5 MCG/ACT Aero  Commonly known as:  DULERA  Inhale 2 puffs into the lungs 2 (two) times daily as needed for wheezing.     oxyCODONE 5 MG immediate release tablet  Commonly known as:  Oxy IR/ROXICODONE  Take 1 tablet (5 mg total) by mouth every 4 (four) hours as needed for pain.     rosuvastatin 20 MG tablet  Commonly known as:  CRESTOR  Take 20 mg by mouth daily.           Follow-up Information   Follow up with [facility], [place] In 2 weeks.   Contact information:   [address] [place] [address] [phone]       Signed: Emelia Loron 04/22/2013, 8:09 AM

## 2013-04-22 NOTE — Progress Notes (Signed)
Patient discharged to home.  Discharge teaching completed including follow up care, medications, signs and symptoms of infection and diet.  Verbalizes understanding with no further questions.  Vital signs stable, ambulating with a steady gait.  Discharged per wheelchair with family.

## 2013-04-25 ENCOUNTER — Encounter: Payer: Self-pay | Admitting: *Deleted

## 2013-04-25 NOTE — Progress Notes (Signed)
Mailed after appt letter to pt. 

## 2013-05-02 ENCOUNTER — Telehealth: Payer: Self-pay | Admitting: *Deleted

## 2013-05-02 NOTE — Telephone Encounter (Signed)
I called patient to f/u since her surgery on 04/21/13 and to review her upcoming appointments.  Voice mail message left.  Patient instructed to call.

## 2013-05-02 NOTE — Telephone Encounter (Signed)
Patient returned my call.  She reports that she is doing well.  She has been increasing her activity to increase her strength and tolerating it well.  She reports that her surgical site is healing well.  We reviewed her upcoming appointments and locations.  She states that she does not have any questions at this time.  I encouraged her to call for any needs or concerns.

## 2013-05-04 NOTE — Progress Notes (Signed)
Alexandra Snyder [id number] [birth year] 79 y.o. 05/04/2013 6:03 PM  CC  Alexandra Snyder., MD [address] [place] [address] Dr. Dorothy Puffer Dr. Emelia Loron  REASON FOR CONSULTATION:  79 year old female with new diagnosis of invasive ductal carcinoma of the left breast diagnosed April 2014 patient is seen in the multidisciplinary breast clinic for discussion of treatment options.  STAGE:   Cancer of upper-outer quadrant of female breast   Primary site: Breast (Left)   Staging method: AJCC 7th Edition   Clinical: Stage IA (T1c, N0, cM0)   Summary: Stage IA (T1c, N0, cM0)  REFERRING PHYSICIAN: Dr. Emelia Loron  HISTORY OF PRESENT ILLNESS:  Alexandra Snyder is a 79 y.o. female.  Have an abnormality on a recent screening mammogram showing some architectural distortion in the left breast. No changes in the right. She had ultrasound that showed a high protocol with mass at the 12:30 o'clock position within the left breast 6 cm from the nipple measuring 1.2 cm.she had ultrasound-guided biopsy the pathology revealed invasive ductal carcinoma, grade 2, ER positive PR negative HER-2/neu negative with Ki-67 10%.patient's case was discussed at the multidisciplinary breast conference. Her pathology and radiology were reviewed. She is now seen in the multidisciplinary breast clinic for discussion of treatment options. She was seen by Dr. Dorothy Puffer and Dr. Emelia Loron. She is without any complaints.  Past Medical History: Past Medical History  Diagnosis Date  . Breast cancer   . Arthritis   . Asthma   . COPD (chronic obstructive pulmonary disease)   . GERD (gastroesophageal reflux disease)     Past Surgical History: Past Surgical History  Procedure Laterality Date  . Eye surgery Bilateral     Cataract removal  . Colonoscopy w/ polypectomy    . Tonsillectomy    . Breast lumpectomy with needle localization Left 04/21/2013    Procedure: LEFT BREAST WIRE  GUIDED  LUMPECTOMY ;  Surgeon: Emelia Loron, MD;  Location: [facility] OR;  Service: General;  Laterality: Left;    Family History: Family History  Problem Relation Age of Onset  . Ovarian cancer Sister     Social History History  Substance Use Topics  . Smoking status: Former Games developer  . Smokeless tobacco: Not on file  . Alcohol Use: No    Allergies: Allergies  Allergen Reactions  . Celebrex (Celecoxib) Itching    Hands itch    Current Medications: Current Outpatient Prescriptions  Medication Sig Dispense Refill  . rosuvastatin (CRESTOR) 20 MG tablet Take 20 mg by mouth daily.      Marland Kitchen acetaminophen (TYLENOL) 325 MG tablet Take 650 mg by mouth every 6 (six) hours as needed for pain or fever.      Marland Kitchen albuterol (PROVENTIL HFA;VENTOLIN HFA) 108 (90 BASE) MCG/ACT inhaler Inhale 2 puffs into the lungs every 6 (six) hours as needed for wheezing.      . cholecalciferol (VITAMIN D) 1000 UNITS tablet Take 1,000 Units by mouth 2 (two) times daily.      Marland Kitchen ibuprofen (ADVIL,MOTRIN) 200 MG tablet Take 200 mg by mouth every 6 (six) hours as needed for pain, fever or headache.      . mometasone-formoterol (DULERA) 100-5 MCG/ACT AERO Inhale 2 puffs into the lungs 2 (two) times daily as needed for wheezing.      Marland Kitchen oxyCODONE (OXY IR/ROXICODONE) 5 MG immediate release tablet Take 1 tablet (5 mg total) by mouth every 4 (four) hours as needed for pain.  10 tablet  0   No current facility-administered medications for this visit.    OB/GYN History:patient had menarche at age 12 she is postmenopausal for many years no history of hormone replacement therapy she is nulliparous  Fertility Discussion:not applicable Prior History of Cancer: breast cancer  Health Maintenance:  Colonoscopyyes Bone Densityyes Last PAP smearyes  ECOG PERFORMANCE STATUS: 0 - Asymptomatic  Genetic Counseling/testing:no  REVIEW OF SYSTEMS:  Comprehensive review of systems was obtained and it is reported separately in the  electronic medical record  PHYSICAL EXAMINATION: Blood pressure 182/100, pulse 94, temperature 97.7 F (36.5 C), temperature source Oral, resp. rate 20, height 5\' 1"  (1.549 m), weight 138 lb 3.2 oz (62.687 kg). Well-developed well-nourished female in no acute distress HEENT exam EOMI PERRLA sclerae anicteric no conjunctival pallor oral mucosa is moist neck is supple lungs are clear cardiovascular regular rate rhythm no murmurs gallops or rubs abdomen is soft nontender nondistended bowel sounds are present no HSM extremities no edema neuro patient's alert oriented otherwise nonfocal. Breast exam: Status post biopsy with a lateral biopsy site in the left breast no skin changes right breast is unremarkable     STUDIES/RESULTS: Dg Chest 2 View  04/18/2013  *RADIOLOGY REPORT*  Clinical Data: Preoperative chest x-ray, history of breast cancer, smoker  CHEST - 2 VIEW  Comparison: None.  Findings: The lungs are hyperinflated and there are central bronchitic changes suggesting underlying COPD.  Additionally, the upper lungs appear emphysematous.  No focal airspace consolidation, pulmonary edema, pleural effusion or pneumothorax.  No suspicious pulmonary nodule.  Cardiac and mediastinal contours within normal limits.  There is mild aortic atherosclerotic calcification. Dextroconvex scoliosis of the lumbar spine incompletely imaged. No acute osseous abnormality.  IMPRESSION:  1.  No acute cardiopulmonary disease 2.  COPD/emphysema 3.  Mild aortic atherosclerosis   Original Report Authenticated By: Malachy Moan, M.D.      LABS:    Chemistry      Component Value Date/Time   NA 142 04/18/2013 0853   K 4.0 04/18/2013 0853   CL 104 04/18/2013 0853   CO2 29 04/18/2013 0853   BUN 16 04/18/2013 0853   CREATININE 0.83 04/18/2013 0853      Component Value Date/Time   CALCIUM 9.9 04/18/2013 0853      Lab Results  Component Value Date   WBC 8.6 04/18/2013   HGB 12.3 04/18/2013   HCT 36.1 04/18/2013   MCV  83.0 04/18/2013   PLT 282 04/18/2013   PATHOLOGY: ADDITIONAL INFORMATION: PROGNOSTIC INDICATORS - ACIS Results: IMMUNOHISTOCHEMICAL AND MORPHOMETRIC ANALYSIS BY THE AUTOMATED CELLULAR IMAGING SYSTEM (ACIS) Estrogen Receptor: 100%, POSITIVE, STRONG STAINING INTENSITY Progesterone Receptor: 0%, NEGATIVE Proliferation Marker Ki67: 10% COMMENT: The negative hormone receptor study in this case has an internal positive control. REFERENCE RANGE ESTROGEN RECEPTOR NEGATIVE <1% POSITIVE =>1% PROGESTERONE RECEPTOR NEGATIVE <1% POSITIVE =>1% All controls stained appropriately Jimmy Picket MD Pathologist, Electronic Signature ( Signed 04/11/2013) CHROMOGENIC IN-SITU HYBRIDIZATION Results: HER-2/NEU BY CISH - NO AMPLIFICATION OF HER-2 DETECTED. RESULT RATIO OF HER2: CEP 17 SIGNALS 0.85 1 of 3 Duplicate copy FINAL for Asare, Amariya J ([id number]) ADDITIONAL INFORMATION:(continued) AVERAGE HER2 COPY NUMBER PER CELL 2.00 REFERENCE RANGE NEGATIVE HER2/Chr17 Ratio <2.0 and Average HER2 copy number <4.0 EQUIVOCAL HER2/Chr17 Ratio <2.0 and Average HER2 copy number 4.0 and <6.0 POSITIVE HER2/Chr17 Ratio >=2.0 and/or Average HER2 copy number >=6.0 Jimmy Picket MD Pathologist, Electronic Signature ( Signed 04/11/2013) FINAL DIAGNOSIS Diagnosis Breast, left, needle core biopsy - INVASIVE DUCTAL CARCINOMA, SEE COMMENT. Microscopic Comment  Although the grade of tumor is best assessed at resection, with these biopsies the invasive tumor is Grade II. Breast prognostic are pending and will be reported in an addendum. The case was reviewed with Dr. Raynald Blend who concurs. (CRR:caf 04/09/13) [place] RUND DO Pathologist, Electronic Signature (Case signed 04/09/2013) Specimen Gross and Clinical Information  ASSESSMENT    79 year old female with   #1 invasive ductal carcinoma of the left breast measuring 1.2 cm at the 12:30 o'clock position ER +100% PR negative HER-2/neu negative Ki-67 10%.patient is  seen in the multidose prior breast clinic for discussion of treatment options. She and I discussed her pathology, rationale for treatment of breast cancer. She is a good candidate for breast conservation. If lumpectomy followed by radiation is discussed with the patient. Certainly she would also be a candidate for antiestrogen therapy consisting of the aromatase inhibitors such as Arimidex , femara,  or Aromasin or tamoxifen if she cannot tolerate the others.we discussed the rationale for these therapies.  Clinical Trial Eligibility: no Multidisciplinary conference discussion yes     PLAN:    #1 patient will proceed with lumpectomy initially.  #2 I will plan on seeing her back after her surgery.        Discussion: Patient is being treated per NCCN breast cancer care guidelines appropriate for stage.I   Thank you so much for allowing me to participate in the care of [employer]. I will continue to follow up the patient with you and assist in her care.  All questions were answered. The patient knows to call the clinic with any problems, questions or concerns. We can certainly see the patient much sooner if necessary.  I spent 55 minutes counseling the patient face to face. The total time spent in the appointment was 60 minutes.  Drue Second, MD Medical/Oncology [facility] [phone] (beeper) [phone] (Office)

## 2013-05-05 ENCOUNTER — Ambulatory Visit (INDEPENDENT_AMBULATORY_CARE_PROVIDER_SITE_OTHER): Payer: Medicare Other | Admitting: General Surgery

## 2013-05-05 ENCOUNTER — Encounter (INDEPENDENT_AMBULATORY_CARE_PROVIDER_SITE_OTHER): Payer: Self-pay | Admitting: General Surgery

## 2013-05-05 ENCOUNTER — Ambulatory Visit: Payer: Medicare Other

## 2013-05-05 ENCOUNTER — Ambulatory Visit: Payer: Medicare Other | Admitting: Radiation Oncology

## 2013-05-05 VITALS — BP 142/82 | HR 78 | Resp 18 | Ht 61.0 in | Wt 137.0 lb

## 2013-05-05 DIAGNOSIS — Z09 Encounter for follow-up examination after completed treatment for conditions other than malignant neoplasm: Secondary | ICD-10-CM

## 2013-05-05 NOTE — Progress Notes (Signed)
Subjective:     Patient ID: Alexandra Snyder, female   DOB: 1934-10-25, 79 y.o.   MRN: [id number]  HPI 74 yof seen initially in [facility] with left breast cancer. We decided to proceed with left breast lumpectomy and omit snbx.  She underwent left breast lumpectomy with pathology showing a 8 mm invasive ductal cancer, dcis and lobular neoplasia.  Margins are all clear with closest being 5 mm at skin.  This is her2 negative, er pos at 100%.  She reports no complaints after surgery.  Review of Systems     Objective:   Physical Exam  Constitutional: She appears well-developed and well-nourished.  Pulmonary/Chest:         Assessment:     Stage I left breast cancer     Plan:     She has done well with surgery and I released her to full activity. She is due to see medical and radiation oncology this week for consideration of adjuvant therapy. I will plan on seeing her back annually.

## 2013-05-05 NOTE — Patient Instructions (Signed)

## 2013-05-06 ENCOUNTER — Ambulatory Visit (HOSPITAL_BASED_OUTPATIENT_CLINIC_OR_DEPARTMENT_OTHER): Payer: Medicare Other | Admitting: Oncology

## 2013-05-06 ENCOUNTER — Encounter: Payer: Self-pay | Admitting: Oncology

## 2013-05-06 ENCOUNTER — Encounter (INDEPENDENT_AMBULATORY_CARE_PROVIDER_SITE_OTHER): Payer: Self-pay

## 2013-05-06 ENCOUNTER — Telehealth: Payer: Self-pay | Admitting: Oncology

## 2013-05-06 ENCOUNTER — Other Ambulatory Visit (HOSPITAL_BASED_OUTPATIENT_CLINIC_OR_DEPARTMENT_OTHER): Payer: Medicare Other | Admitting: Lab

## 2013-05-06 VITALS — BP 179/81 | HR 88 | Temp 98.2°F | Resp 20 | Ht 61.0 in | Wt 138.1 lb

## 2013-05-06 DIAGNOSIS — C50412 Malignant neoplasm of upper-outer quadrant of left female breast: Secondary | ICD-10-CM

## 2013-05-06 DIAGNOSIS — C50419 Malignant neoplasm of upper-outer quadrant of unspecified female breast: Secondary | ICD-10-CM

## 2013-05-06 LAB — COMPREHENSIVE METABOLIC PANEL (CC13)
ALT: 15 U/L (ref 0–55)
AST: 18 U/L (ref 5–34)
Calcium: 9.7 mg/dL (ref 8.4–10.4)
Chloride: 103 mEq/L (ref 98–107)
Creatinine: 0.9 mg/dL (ref 0.6–1.1)

## 2013-05-06 LAB — CBC WITH DIFFERENTIAL/PLATELET
BASO%: 0.4 % (ref 0.0–2.0)
EOS%: 2.7 % (ref 0.0–7.0)
HCT: 36.9 % (ref 34.8–46.6)
MCH: 28.1 pg (ref 25.1–34.0)
MCHC: 33 g/dL (ref 31.5–36.0)
NEUT%: 55.7 % (ref 38.4–76.8)
RDW: 15.7 % — ABNORMAL HIGH (ref 11.2–14.5)
lymph#: 2.8 10*3/uL (ref 0.9–3.3)

## 2013-05-06 NOTE — Patient Instructions (Addendum)
Proceed with radiation therapy  I will see you back in July to begin on anti-estrogen therapy

## 2013-05-06 NOTE — Progress Notes (Signed)
OFFICE PROGRESS NOTE  CC  Alexandra Garnet., MD [address] [place] [address] Dr. Dorothy Puffer  Dr. Emelia Loron     DIAGNOSIS: 79 year old female with new diagnosis of invasive ductal carcinoma of the left breast diagnosed April 2014  STAGE:  Cancer of upper-outer quadrant of female breast  Primary site: Breast (Left)  Staging method: AJCC 7th Edition  Clinical: Stage IA (T1c, N0, cM0)  Summary: Stage IA (T1c, N0, cM0)   PRIOR THERAPY:  #1 on a recent screening mammogram showing some architectural distortion in the left breast. No changes in the right. She had ultrasound that showed a high protocol with mass at the 12:30 o'clock position within the left breast 6 cm from the nipple measuring 1.2 cm.she had ultrasound-guided biopsy the pathology revealed invasive ductal carcinoma, grade 2, ER positive PR negative HER-2/neu negative with Ki-67 10%  #2 patient is now status post left breast lumpectomy with the final pathology revealing a 0.8 cm invasive ductal carcinoma intermediate grade ER positive PR negative HER-2/neu negative with Ki-67 of 10%.  #3 patient will be referred to radiation oncology. She was originally seen by Dr. Dorothy Puffer during the multidisciplinary breast clinic in April 2014.  #4 once patient completes radiation therapy she will then begin antiestrogen therapy with Arimidex 1 mg daily we discussed this in detail today.   CURRENT THERAPY: Refer to radiation therapy  INTERVAL HISTORY: Alexandra Snyder 79 y.o. female returns for followup visit post lumpectomy. Overall she tolerated her surgery well without any significant complications. Today she feels well except for some tenderness over the surgical site. She denies any nausea vomiting she has no fevers or chills. She has not been taking any of her pain medications she does not feel that she needs to. She has no myalgias and arthralgias. She is however tired and fatigued and has to take  frequent naps now. She is concerned about that since she is always very active. I reassured her that this is just normal. Remainder of the 10 point review of systems is negative.  MEDICAL HISTORY: Past Medical History  Diagnosis Date  . Breast cancer     left  . Arthritis   . Asthma   . COPD (chronic obstructive pulmonary disease)   . GERD (gastroesophageal reflux disease)     ALLERGIES:  is allergic to celebrex.  MEDICATIONS:  Current Outpatient Prescriptions  Medication Sig Dispense Refill  . albuterol (PROVENTIL HFA;VENTOLIN HFA) 108 (90 BASE) MCG/ACT inhaler Inhale 2 puffs into the lungs every 6 (six) hours as needed for wheezing.      . cholecalciferol (VITAMIN D) 1000 UNITS tablet Take 1,000 Units by mouth 2 (two) times daily.      Marland Kitchen ibuprofen (ADVIL,MOTRIN) 200 MG tablet Take 200 mg by mouth every 6 (six) hours as needed for pain, fever or headache.      . rosuvastatin (CRESTOR) 20 MG tablet Take 20 mg by mouth daily.      Marland Kitchen acetaminophen (TYLENOL) 325 MG tablet Take 650 mg by mouth every 6 (six) hours as needed for pain or fever.      . mometasone-formoterol (DULERA) 100-5 MCG/ACT AERO Inhale 2 puffs into the lungs 2 (two) times daily as needed for wheezing.      Marland Kitchen oxyCODONE (OXY IR/ROXICODONE) 5 MG immediate release tablet Take 1 tablet (5 mg total) by mouth every 4 (four) hours as needed for pain.  10 tablet  0   No current facility-administered medications for  this visit.    SURGICAL HISTORY:  Past Surgical History  Procedure Laterality Date  . Eye surgery Bilateral     Cataract removal  . Colonoscopy w/ polypectomy    . Tonsillectomy    . Breast lumpectomy with needle localization Left 04/21/2013    Procedure: LEFT BREAST WIRE GUIDED  LUMPECTOMY ;  Surgeon: Emelia Loron, MD;  Location: [facility] OR;  Service: General;  Laterality: Left;    REVIEW OF SYSTEMS:  Pertinent items are noted in HPI.   HEALTH MAINTENANCE:   PHYSICAL EXAMINATION: Blood pressure 179/81,  pulse 88, temperature 98.2 F (36.8 C), temperature source Oral, resp. rate 20, height 5\' 1"  (1.549 m), weight 138 lb 1.6 oz (62.642 kg). Body mass index is 26.11 kg/(m^2). ECOG PERFORMANCE STATUS: 0 - Asymptomatic  Well-developed nourished female appears younger than her stated age HEENT exam EOMI PERRLA sclerae anicteric no conjunctival pallor oral mucosa is moist no mucositis neck is supple lungs clear bilaterally cardiovascular regular rate rhythm abdomen soft nontender no HSM extremities no edema neuro nonfocal breast exam left breast reveals healing surgical scar no nipple discharge no redness or erythema. Right breast no masses or nipple discharge.     LABORATORY DATA: Lab Results  Component Value Date   WBC 9.7 05/06/2013   HGB 12.2 05/06/2013   HCT 36.9 05/06/2013   MCV 85.0 05/06/2013   PLT 263 05/06/2013      Chemistry      Component Value Date/Time   NA 141 05/06/2013 0923   NA 142 04/18/2013 0853   K 4.1 05/06/2013 0923   K 4.0 04/18/2013 0853   CL 103 05/06/2013 0923   CL 104 04/18/2013 0853   CO2 25 05/06/2013 0923   CO2 29 04/18/2013 0853   BUN 15.2 05/06/2013 0923   BUN 16 04/18/2013 0853   CREATININE 0.9 05/06/2013 0923   CREATININE 0.83 04/18/2013 0853      Component Value Date/Time   CALCIUM 9.7 05/06/2013 0923   CALCIUM 9.9 04/18/2013 0853   ALKPHOS 104 05/06/2013 0923   AST 18 05/06/2013 0923   ALT 15 05/06/2013 0923   BILITOT 0.31 05/06/2013 0923     ADDITIONAL INFORMATION: CHROMOGENIC IN-SITU HYBRIDIZATION Results: HER-2/NEU BY CISH - NO AMPLIFICATION OF HER-2 DETECTED. RESULT RATIO OF HER2: CEP 17 SIGNALS 1.08 AVERAGE HER2 COPY NUMBER PER CELL 1.3 REFERENCE RANGE NEGATIVE HER2/Chr17 Ratio <2.0 and Average HER2 copy number <4.0 EQUIVOCAL HER2/Chr17 Ratio <2.0 and Average HER2 copy number 4.0 and <6.0 POSITIVE HER2/Chr17 Ratio >=2.0 and/or Average HER2 copy number >=6.0 [name] MD Pathologist, Electronic Signature ( Signed 04/29/2013) FINAL  DIAGNOSIS Diagnosis Breast, lumpectomy, left - INVASIVE DUCTAL CARCINOMA, GRADE I/III, SPANNING 0.8 CM. - DUCTAL CARCINOMA IN SITU, LOW GRADE. - LOBULAR NEOPLASIA (ATYPICAL LOBULAR HYPERPLASIA). - THE SURGICAL RESECTION MARGINS ARE NEGATIVE FOR CARCINOMA. - SEE ONCOLOGY TABLE BELOW. Microscopic Comment BREAST, INVASIVE TUMOR, WITHOUT LYMPH NODE SAMPLING 1 of 3 FINAL for Penning, Hattie J ([id number]) Microscopic Comment(continued) Specimen, including laterality: Left breast Procedure: Lumpectomy Grade: I Tubule formation: II Nuclear pleomorphism: I Mitotic: I Tumor size (gross measurement): 0.8 cm Margins: Negative for carcinoma Invasive, distance to closest margin: 0.5 cm to anterior margin (gross measurement) In-situ, distance to closest margin: 0.5 cm to anterior margin (gross measurement) Lymphovascular invasion: Not identified Ductal carcinoma in situ: Present Grade: Low grade Extensive intraductal component: Present Lobular neoplasia: Present (atypical lobular hyperplasia) Tumor focality: Unifocal Treatment effect: N/A Extent of tumor: Confined to breast parenchyma Breast prognostic profile: [id number]  Estrogen receptor: 100%, strong staining intensity Progesterone receptor: 0% Her 2 neu: No amplification was detected. The ratio was 0.85. Ki-67: 10% Non-neoplastic breast: Healing biopsy site TNM: pT1b, pNX Comments: Her 2 neu by CISH will be repeated on the current case and the results reported separately (JBK:caf 04/24/13) Pecola Leisure MD Pathologist, Electronic Signature (Case signed 04/24/2013) Specimen Gross and Clinical Information   RADIOGRAPHIC STUDIES:  Dg Chest 2 View  04/18/2013  *RADIOLOGY REPORT*  Clinical Data: Preoperative chest x-ray, history of breast cancer, smoker  CHEST - 2 VIEW  Comparison: None.  Findings: The lungs are hyperinflated and there are central bronchitic changes suggesting underlying COPD.  Additionally, the upper lungs appear  emphysematous.  No focal airspace consolidation, pulmonary edema, pleural effusion or pneumothorax.  No suspicious pulmonary nodule.  Cardiac and mediastinal contours within normal limits.  There is mild aortic atherosclerotic calcification. Dextroconvex scoliosis of the lumbar spine incompletely imaged. No acute osseous abnormality.  IMPRESSION:  1.  No acute cardiopulmonary disease 2.  COPD/emphysema 3.  Mild aortic atherosclerosis   Original Report Authenticated By: Malachy Moan, M.D.     ASSESSMENT: 79 year old female with  #1 new diagnosis of stage I (T1 Nx) invasive ductal carcinoma of the left breast measuring 0.8 cm, ER positive PR positive HER-2/neu negative with Ki-67 10%. Patient is now status post lumpectomy performed April 2014. She tolerated the procedure well without any problems.  #2 patient will initially proceed with adjuvant radiation therapy. She has an appointment coming up with Dr. Dorothy Puffer. Once she completes this I will begin her on antiestrogen therapy adjuvantly with Arimidex 1 mg daily we discussed rationale risks benefits and side effects.   PLAN:   #1 proceed with radiation consultation.  #2 I will see her back in 2 months time for followup.   All questions were answered. The patient knows to call the clinic with any problems, questions or concerns. We can certainly see the patient much sooner if necessary.  I spent 25 minutes counseling the patient face to face. The total time spent in the appointment was 30 minutes.    Drue Second, MD Medical/Oncology [facility] [phone] (beeper) [phone] (Office)  05/06/2013, 11:36 AM

## 2013-05-06 NOTE — Progress Notes (Signed)
Location of Breast Cancer: upper-outer quadrant of female breast, left  Histology per Pathology Report: invasive ductal carcinoma in situ  Receptor Status: ER positive PR negative HER-2/neu negative   Did patient present with symptoms (if so, please note symptoms) or was this found on screening mammography?:screening mammography  Past/Anticipated interventions by surgeon, if any: lumpectomy 04/21/2013  Past/Anticipated interventions by medical oncology, if any:  antiestrogen therapy consisting of the aromatase inhibitors such as Arimidex , femara, or Aromasin or tamoxifen if she cannot tolerate the others.  Lymphedema issues, if any:  no  Pain issues, if any:  no  SAFETY ISSUES:  Prior radiation? no  Pacemaker/ICD? no  Possible current pregnancy? no  Is the patient on methotrexate? no  Current Complaints / other details:  Alexandra Snyder here for consult for left breast cancer. She denies pain.  She has had fatigue since her surgery.

## 2013-05-07 ENCOUNTER — Ambulatory Visit
Admission: RE | Admit: 2013-05-07 | Discharge: 2013-05-07 | Disposition: A | Discharge status: Home / Self Care | Payer: Medicare Other | Source: Ambulatory Visit | Attending: Radiation Oncology | Admitting: Radiation Oncology

## 2013-05-07 ENCOUNTER — Encounter: Payer: Self-pay | Admitting: Radiation Oncology

## 2013-05-07 VITALS — BP 152/84 | HR 81 | Temp 97.7°F | Ht 61.0 in | Wt 137.9 lb

## 2013-05-07 DIAGNOSIS — C50919 Malignant neoplasm of unspecified site of unspecified female breast: Secondary | ICD-10-CM | POA: Insufficient documentation

## 2013-05-07 DIAGNOSIS — I700 Atherosclerosis of aorta: Secondary | ICD-10-CM | POA: Insufficient documentation

## 2013-05-07 DIAGNOSIS — Z170 Estrogen receptor positive status [ER+]: Secondary | ICD-10-CM | POA: Insufficient documentation

## 2013-05-07 DIAGNOSIS — M4120 Other idiopathic scoliosis, site unspecified: Secondary | ICD-10-CM | POA: Insufficient documentation

## 2013-05-07 DIAGNOSIS — C50412 Malignant neoplasm of upper-outer quadrant of left female breast: Secondary | ICD-10-CM

## 2013-05-07 DIAGNOSIS — J438 Other emphysema: Secondary | ICD-10-CM | POA: Insufficient documentation

## 2013-05-07 DIAGNOSIS — Z79899 Other long term (current) drug therapy: Secondary | ICD-10-CM | POA: Insufficient documentation

## 2013-05-07 NOTE — Progress Notes (Signed)
Radiation Oncology         [phone] ________________________________  Name: Alexandra Snyder MRN: [id number]  Date: 05/07/2013  DOB: July 17, 1934  Follow-Up Visit Note  CC: [name], MD  Emelia Loron, MD  Diagnosis:   Invasive ductal carcinoma of the left breast  Interval Since Last Radiation:  Not applicable   Narrative:  The patient returns today for evaluation regarding adjuvant radiotherapy. The patient previously was seen in breast clinic. She proceeded with a lumpectomy on 04/21/2013. This revealed an invasive ductal carcinoma measuring 0.8 cm. The margins were negative. No sentinel lymph node biopsy was obtained. Tumor receptors indicate that the tumor is ER positive, PR positive, and HER-2/neu negative.  The patient has been healing well from her surgery. She denies any pain. She has had some fatigue since surgery. She has seen Dr. Park Breed in medical oncology and they had discussed beginning anti-hormonal treatment after adjuvant radiotherapy.                                ALLERGIES:  is allergic to celebrex.  Meds: Current Outpatient Prescriptions  Medication Sig Dispense Refill  . albuterol (PROVENTIL HFA;VENTOLIN HFA) 108 (90 BASE) MCG/ACT inhaler Inhale 2 puffs into the lungs every 6 (six) hours as needed for wheezing.      . cholecalciferol (VITAMIN D) 1000 UNITS tablet Take 1,000 Units by mouth 2 (two) times daily.      Marland Kitchen ibuprofen (ADVIL,MOTRIN) 200 MG tablet Take 200 mg by mouth every 6 (six) hours as needed for pain, fever or headache.      . mometasone-formoterol (DULERA) 100-5 MCG/ACT AERO Inhale 2 puffs into the lungs 2 (two) times daily as needed for wheezing.      . rosuvastatin (CRESTOR) 20 MG tablet Take 20 mg by mouth daily.      Marland Kitchen acetaminophen (TYLENOL) 325 MG tablet Take 650 mg by mouth every 6 (six) hours as needed for pain or fever.      Marland Kitchen oxyCODONE (OXY IR/ROXICODONE) 5 MG immediate release tablet Take 1 tablet (5 mg total) by mouth every 4  (four) hours as needed for pain.  10 tablet  0   No current facility-administered medications for this encounter.    Physical Findings: The patient is in no acute distress. Patient is alert and oriented.  height is 5\' 1"  (1.549 m) and weight is 137 lb 14.4 oz (62.551 kg). Her temperature is 97.7 F (36.5 C). Her blood pressure is 152/84 and her pulse is 81. .   The patient is healing well with a well-healed surgical incision present in the upper outer quadrant of the left breast. No sign of infection.  Lab Findings: Lab Results  Component Value Date   WBC 9.7 05/06/2013   HGB 12.2 05/06/2013   HCT 36.9 05/06/2013   MCV 85.0 05/06/2013   PLT 263 05/06/2013     Radiographic Findings: Dg Chest 2 View  04/18/2013   *RADIOLOGY REPORT*  Clinical Data: Preoperative chest x-ray, history of breast cancer, smoker  CHEST - 2 VIEW  Comparison: None.  Findings: The lungs are hyperinflated and there are central bronchitic changes suggesting underlying COPD.  Additionally, the upper lungs appear emphysematous.  No focal airspace consolidation, pulmonary edema, pleural effusion or pneumothorax.  No suspicious pulmonary nodule.  Cardiac and mediastinal contours within normal limits.  There is mild aortic atherosclerotic calcification. Dextroconvex scoliosis of the lumbar spine incompletely imaged. No  acute osseous abnormality.  IMPRESSION:  1.  No acute cardiopulmonary disease 2.  COPD/emphysema 3.  Mild aortic atherosclerosis   Original Report Authenticated By: Malachy Moan, M.D.    Impression:    The patient is status post lumpectomy for a T1 B. invasive ductal carcinoma of the left breast. Clinically node negative. Margins are negative. The patient is appropriate for adjuvant radiotherapy. Previously, we discussed a four-week course of treatment involving high tangents. This remains an appropriate treatment and she does wish to proceed with this.  Plan:  Simulation in a couple of weeks. The patient  wishes to begin her treatment in early June which will be fine.  15 minute visit   Radene Gunning, M.D., Ph.D.

## 2013-05-07 NOTE — Progress Notes (Signed)
Please see the Nurse Progress Note in the MD Initial Consult Encounter for this patient. 

## 2013-05-14 ENCOUNTER — Ambulatory Visit
Admission: RE | Admit: 2013-05-14 | Discharge: 2013-05-14 | Disposition: A | Discharge status: Home / Self Care | Payer: Medicare Other | Source: Ambulatory Visit | Attending: Radiation Oncology | Admitting: Radiation Oncology

## 2013-05-14 DIAGNOSIS — C50412 Malignant neoplasm of upper-outer quadrant of left female breast: Secondary | ICD-10-CM

## 2013-05-14 DIAGNOSIS — Z510 Encounter for antineoplastic radiation therapy: Secondary | ICD-10-CM | POA: Insufficient documentation

## 2013-05-14 DIAGNOSIS — Z79899 Other long term (current) drug therapy: Secondary | ICD-10-CM | POA: Insufficient documentation

## 2013-05-14 DIAGNOSIS — Y842 Radiological procedure and radiotherapy as the cause of abnormal reaction of the patient, or of later complication, without mention of misadventure at the time of the procedure: Secondary | ICD-10-CM | POA: Insufficient documentation

## 2013-05-14 DIAGNOSIS — L988 Other specified disorders of the skin and subcutaneous tissue: Secondary | ICD-10-CM | POA: Insufficient documentation

## 2013-05-14 DIAGNOSIS — C50919 Malignant neoplasm of unspecified site of unspecified female breast: Secondary | ICD-10-CM | POA: Insufficient documentation

## 2013-05-14 NOTE — Progress Notes (Signed)
  Radiation Oncology         [phone] ________________________________  Name: Alexandra Snyder MRN: [id number]  Date: 05/14/2013  DOB: Nov 03, 1934  SIMULATION AND TREATMENT PLANNING NOTE  The patient presented for simulation prior to beginning her course of radiation treatment for her diagnosis of left-sided breast cancer. The patient was placed in a supine position on a breast board. A customized accuform device was also constructed and this complex treatment device will be used on a daily basis during her treatment. In this fashion, a CT scan was obtained through the chest area and an isocenter was placed near the chest wall within the left breast.  The patient will be planned to receive a course of radiation initially to a dose of 42.5 gray. This will consist of a whole breast radiotherapy technique. To accomplish this, 2 customized blocks have been designed which will correspond to medial and lateral whole breast tangent fields - both of these fields will use wedges. This treatment will be accomplished at 2.5 gray per fraction. A complex isodose plan is requested to ensure that the breast target area is adequately covered dosimetrically. A forward planning technique will also be evaluated to determine if this approach improves the plan. It is anticipated that the patient will then receive a 7.5 gray boost to the seroma cavity which has been contoured. This will be accomplished at 2 gray per fraction. The final anticipated total dose therefore will correspond to 50 gray.  This initial treatment will consist of a 3-D conformal technique. The lumpectomy cavity has been contoured as the primary target structure. Additionally, dose volume histograms of both this target as well as the lungs and heart will also be evaluated. Such an approach is necessary to ensure that the target area is adequately covered while the nearby critical  normal structures are adequately  spared.   _______________________________   Radene Gunning, MD, PhD

## 2013-05-23 ENCOUNTER — Ambulatory Visit: Payer: Medicare Other | Admitting: Radiation Oncology

## 2013-05-23 ENCOUNTER — Telehealth: Payer: Self-pay | Admitting: *Deleted

## 2013-05-23 NOTE — Telephone Encounter (Signed)
I called patient to remind her of her upcoming appointments.  She reports that she is doing well and that her surgery site is almost completely healed.  She denied any questions or concerns at this time.  I encouraged her to call for any needs.

## 2013-05-26 ENCOUNTER — Ambulatory Visit
Admission: RE | Admit: 2013-05-26 | Discharge: 2013-05-26 | Disposition: A | Discharge status: Home / Self Care | Payer: Medicare Other | Source: Ambulatory Visit | Attending: Radiation Oncology | Admitting: Radiation Oncology

## 2013-05-26 DIAGNOSIS — C50412 Malignant neoplasm of upper-outer quadrant of left female breast: Secondary | ICD-10-CM

## 2013-05-27 ENCOUNTER — Ambulatory Visit
Admission: RE | Admit: 2013-05-27 | Discharge: 2013-05-27 | Disposition: A | Discharge status: Home / Self Care | Payer: Medicare Other | Source: Ambulatory Visit | Attending: Radiation Oncology | Admitting: Radiation Oncology

## 2013-05-28 ENCOUNTER — Ambulatory Visit
Admission: RE | Admit: 2013-05-28 | Discharge: 2013-05-28 | Disposition: A | Discharge status: Home / Self Care | Payer: Medicare Other | Source: Ambulatory Visit | Attending: Radiation Oncology | Admitting: Radiation Oncology

## 2013-05-29 ENCOUNTER — Ambulatory Visit
Admission: RE | Admit: 2013-05-29 | Discharge: 2013-05-29 | Disposition: A | Discharge status: Home / Self Care | Payer: Medicare Other | Source: Ambulatory Visit | Attending: Radiation Oncology | Admitting: Radiation Oncology

## 2013-05-29 ENCOUNTER — Encounter: Payer: Self-pay | Admitting: Radiation Oncology

## 2013-05-29 VITALS — BP 152/82 | HR 76 | Temp 98.0°F | Resp 20 | Wt 139.9 lb

## 2013-05-29 DIAGNOSIS — C50412 Malignant neoplasm of upper-outer quadrant of left female breast: Secondary | ICD-10-CM

## 2013-05-29 MED ORDER — RADIAPLEXRX EX GEL
Freq: Once | CUTANEOUS | Status: AC
  Administered 2013-05-29: 13:00:00 via TOPICAL

## 2013-05-29 NOTE — Progress Notes (Signed)
Post sim ed completed w/pt. Gave pt "Radiation and You " booklet w/all pertinent information marked and discussed, re: fatigue, skin irritation/care, nutrition, pain. All questions answered. Pt has her own aluminum-free deodorant, gave her Radiaplex lotion w/instructions for proper use.

## 2013-05-29 NOTE — Progress Notes (Signed)
Pt denies pain, fatigue, loss of appetite. Post sim ed completed w/pt, charted under pt education appointment.

## 2013-05-29 NOTE — Progress Notes (Signed)
   Department of Radiation Oncology  Phone:  [phone] Fax:        [phone]  Weekly Treatment Note    Name: Alexandra Snyder Date: 05/29/2013 MRN: [id number] DOB: 1934-12-22   Current dose: 7.5 Gy  Current fraction: 3   MEDICATIONS: Current Outpatient Prescriptions  Medication Sig Dispense Refill  . acetaminophen (TYLENOL) 325 MG tablet Take 650 mg by mouth every 6 (six) hours as needed for pain or fever.      Marland Kitchen albuterol (PROVENTIL HFA;VENTOLIN HFA) 108 (90 BASE) MCG/ACT inhaler Inhale 2 puffs into the lungs every 6 (six) hours as needed for wheezing.      . cholecalciferol (VITAMIN D) 1000 UNITS tablet Take 1,000 Units by mouth 2 (two) times daily.      Marland Kitchen ibuprofen (ADVIL,MOTRIN) 200 MG tablet Take 200 mg by mouth every 6 (six) hours as needed for pain, fever or headache.      . mometasone-formoterol (DULERA) 100-5 MCG/ACT AERO Inhale 2 puffs into the lungs 2 (two) times daily as needed for wheezing.      Marland Kitchen oxyCODONE (OXY IR/ROXICODONE) 5 MG immediate release tablet Take 1 tablet (5 mg total) by mouth every 4 (four) hours as needed for pain.  10 tablet  0  . rosuvastatin (CRESTOR) 20 MG tablet Take 20 mg by mouth daily.      . hyaluronate sodium (RADIAPLEXRX) GEL Apply topically 2 (two) times daily.       No current facility-administered medications for this encounter.     ALLERGIES: Celebrex   LABORATORY DATA:  Lab Results  Component Value Date   WBC 9.7 05/06/2013   HGB 12.2 05/06/2013   HCT 36.9 05/06/2013   MCV 85.0 05/06/2013   PLT 263 05/06/2013   Lab Results  Component Value Date   NA 141 05/06/2013   K 4.1 05/06/2013   CL 103 05/06/2013   CO2 25 05/06/2013   Lab Results  Component Value Date   ALT 15 05/06/2013   AST 18 05/06/2013   ALKPHOS 104 05/06/2013   BILITOT 0.31 05/06/2013     NARRATIVE: Alexandra Snyder was seen today for weekly treatment management. The chart was checked and the patient's films were reviewed. The patient is doing very well and her  first week of treatment. No difficulties so far.  PHYSICAL EXAMINATION: weight is 139 lb 14.4 oz (63.458 kg). Her oral temperature is 98 F (36.7 C). Her blood pressure is 152/82 and her pulse is 76. Her respiration is 20.        ASSESSMENT: The patient is doing satisfactorily with treatment.  PLAN: We will continue with the patient's radiation treatment as planned.

## 2013-05-30 ENCOUNTER — Ambulatory Visit
Admission: RE | Admit: 2013-05-30 | Discharge: 2013-05-30 | Disposition: A | Discharge status: Home / Self Care | Payer: Medicare Other | Source: Ambulatory Visit | Attending: Radiation Oncology | Admitting: Radiation Oncology

## 2013-06-02 ENCOUNTER — Ambulatory Visit
Admission: RE | Admit: 2013-06-02 | Discharge: 2013-06-02 | Disposition: A | Discharge status: Home / Self Care | Payer: Medicare Other | Source: Ambulatory Visit | Attending: Radiation Oncology | Admitting: Radiation Oncology

## 2013-06-03 ENCOUNTER — Ambulatory Visit
Admission: RE | Admit: 2013-06-03 | Discharge: 2013-06-03 | Disposition: A | Discharge status: Home / Self Care | Payer: Medicare Other | Source: Ambulatory Visit | Attending: Radiation Oncology | Admitting: Radiation Oncology

## 2013-06-04 ENCOUNTER — Ambulatory Visit
Admission: RE | Admit: 2013-06-04 | Discharge: 2013-06-04 | Disposition: A | Discharge status: Home / Self Care | Payer: Medicare Other | Source: Ambulatory Visit | Attending: Radiation Oncology | Admitting: Radiation Oncology

## 2013-06-05 ENCOUNTER — Ambulatory Visit
Admission: RE | Admit: 2013-06-05 | Discharge: 2013-06-05 | Disposition: A | Discharge status: Home / Self Care | Payer: Medicare Other | Source: Ambulatory Visit | Attending: Radiation Oncology | Admitting: Radiation Oncology

## 2013-06-06 ENCOUNTER — Ambulatory Visit
Admission: RE | Admit: 2013-06-06 | Discharge: 2013-06-06 | Disposition: A | Discharge status: Home / Self Care | Payer: Medicare Other | Source: Ambulatory Visit | Attending: Radiation Oncology | Admitting: Radiation Oncology

## 2013-06-06 ENCOUNTER — Encounter: Payer: Self-pay | Admitting: Radiation Oncology

## 2013-06-06 VITALS — BP 153/83 | HR 72 | Temp 97.5°F | Resp 20 | Wt 139.1 lb

## 2013-06-06 DIAGNOSIS — C50412 Malignant neoplasm of upper-outer quadrant of left female breast: Secondary | ICD-10-CM

## 2013-06-06 NOTE — Progress Notes (Signed)
  Radiation Oncology         [phone] ________________________________  Name: Alexandra Snyder MRN: [id number]  Date: 06/06/2013  DOB: 1934/12/15  Weekly Radiation Therapy Management  Current Dose: 22.5 Gy     Planned Dose:  42.5 Gy plus boost  Narrative . . . . . . . . The patient presents for routine under treatment assessment.                                      Pt denies pain, fatigue, loss of appetite. She states she has " a few red spots on her chest". She states she is prone to sun poisoning. Pt denies itching; she is applying Radiaplex lotion                                 Set-up films were reviewed.                                 The chart was checked. Physical Findings. . .  weight is 139 lb 1.6 oz (63.095 kg). Her oral temperature is 97.5 F (36.4 C). Her blood pressure is 153/83 and her pulse is 72. Her respiration is 20. . Weight essentially stable.  No significant changes. Impression . . . . . . . The patient is  tolerating radiation. Plan . . . . . . . . . . . . Continue treatment as planned.  ________________________________  Artist Pais. Kathrynn Running, M.D.

## 2013-06-06 NOTE — Progress Notes (Signed)
Pt denies pain, fatigue, loss of appetite. She states she has " a few red spots on her chest". She states she is prone to sun poisoning. Pt denies itching; she is applying Radiaplex lotion.

## 2013-06-09 ENCOUNTER — Ambulatory Visit
Admission: RE | Admit: 2013-06-09 | Discharge: 2013-06-09 | Disposition: A | Discharge status: Home / Self Care | Payer: Medicare Other | Source: Ambulatory Visit | Attending: Radiation Oncology | Admitting: Radiation Oncology

## 2013-06-10 ENCOUNTER — Ambulatory Visit
Admission: RE | Admit: 2013-06-10 | Discharge: 2013-06-10 | Disposition: A | Discharge status: Home / Self Care | Payer: Medicare Other | Source: Ambulatory Visit | Attending: Radiation Oncology | Admitting: Radiation Oncology

## 2013-06-11 ENCOUNTER — Ambulatory Visit
Admission: RE | Admit: 2013-06-11 | Discharge: 2013-06-11 | Disposition: A | Discharge status: Home / Self Care | Payer: Medicare Other | Source: Ambulatory Visit | Attending: Radiation Oncology | Admitting: Radiation Oncology

## 2013-06-12 ENCOUNTER — Ambulatory Visit
Admission: RE | Admit: 2013-06-12 | Discharge: 2013-06-12 | Disposition: A | Discharge status: Home / Self Care | Payer: Medicare Other | Source: Ambulatory Visit | Attending: Radiation Oncology | Admitting: Radiation Oncology

## 2013-06-13 ENCOUNTER — Ambulatory Visit
Admission: RE | Admit: 2013-06-13 | Discharge: 2013-06-13 | Disposition: A | Discharge status: Home / Self Care | Payer: Medicare Other | Source: Ambulatory Visit | Attending: Radiation Oncology | Admitting: Radiation Oncology

## 2013-06-13 ENCOUNTER — Encounter: Payer: Self-pay | Admitting: Radiation Oncology

## 2013-06-13 VITALS — BP 151/84 | HR 79 | Temp 97.8°F | Resp 20 | Wt 139.7 lb

## 2013-06-13 DIAGNOSIS — C50412 Malignant neoplasm of upper-outer quadrant of left female breast: Secondary | ICD-10-CM

## 2013-06-13 MED ORDER — RADIAPLEXRX EX GEL
Freq: Once | CUTANEOUS | Status: AC
  Administered 2013-06-13: 11:00:00 via TOPICAL

## 2013-06-13 NOTE — Progress Notes (Signed)
   Department of Radiation Oncology  Phone:  [phone] Fax:        [phone]  Weekly Treatment Note    Name: Alexandra Snyder Date: 06/13/2013 MRN: [id number] DOB: 1934-10-13   Current dose: 35 Gy  Current fraction: 14   MEDICATIONS: Current Outpatient Prescriptions  Medication Sig Dispense Refill  . acetaminophen (TYLENOL) 325 MG tablet Take 650 mg by mouth every 6 (six) hours as needed for pain or fever.      Marland Kitchen albuterol (PROVENTIL HFA;VENTOLIN HFA) 108 (90 BASE) MCG/ACT inhaler Inhale 2 puffs into the lungs every 6 (six) hours as needed for wheezing.      . cholecalciferol (VITAMIN D) 1000 UNITS tablet Take 1,000 Units by mouth 2 (two) times daily.      . hyaluronate sodium (RADIAPLEXRX) GEL Apply topically 2 (two) times daily.      Marland Kitchen ibuprofen (ADVIL,MOTRIN) 200 MG tablet Take 200 mg by mouth every 6 (six) hours as needed for pain, fever or headache.      . mometasone-formoterol (DULERA) 100-5 MCG/ACT AERO Inhale 2 puffs into the lungs 2 (two) times daily as needed for wheezing.      Marland Kitchen oxyCODONE (OXY IR/ROXICODONE) 5 MG immediate release tablet Take 1 tablet (5 mg total) by mouth every 4 (four) hours as needed for pain.  10 tablet  0  . rosuvastatin (CRESTOR) 20 MG tablet Take 20 mg by mouth daily.       No current facility-administered medications for this encounter.     ALLERGIES: Celebrex   LABORATORY DATA:  Lab Results  Component Value Date   WBC 9.7 05/06/2013   HGB 12.2 05/06/2013   HCT 36.9 05/06/2013   MCV 85.0 05/06/2013   PLT 263 05/06/2013   Lab Results  Component Value Date   NA 141 05/06/2013   K 4.1 05/06/2013   CL 103 05/06/2013   CO2 25 05/06/2013   Lab Results  Component Value Date   ALT 15 05/06/2013   AST 18 05/06/2013   ALKPHOS 104 05/06/2013   BILITOT 0.31 05/06/2013     NARRATIVE: Alexandra Snyder was seen today for weekly treatment management. The chart was checked and the patient's films were reviewed. The patient is doing well with  treatment. No change in fatigue. She is using skin cream daily.  PHYSICAL EXAMINATION: weight is 139 lb 11.2 oz (63.368 kg). Her oral temperature is 97.8 F (36.6 C). Her blood pressure is 151/84 and her pulse is 79. Her respiration is 20.      diffuse erythema/hyperpigmentation. No desquamation. A little more irritation in the inframammary region than throughout the breast. Overall her skin looks good.  ASSESSMENT: The patient is doing satisfactorily with treatment.  PLAN: We will continue with the patient's radiation treatment as planned. She will continue her current skin care.

## 2013-06-13 NOTE — Progress Notes (Signed)
Weekly rad txs, 14/20 left breast completed, erythema and some dermatitis on top of breast,slight c/o soreness and itches at times, skin intact, no c/o pain, 2nd tube radiaplex given per request,uses bid  10:28 AM

## 2013-06-16 ENCOUNTER — Ambulatory Visit
Admission: RE | Admit: 2013-06-16 | Discharge: 2013-06-16 | Disposition: A | Discharge status: Home / Self Care | Payer: Medicare Other | Source: Ambulatory Visit | Attending: Radiation Oncology | Admitting: Radiation Oncology

## 2013-06-17 ENCOUNTER — Ambulatory Visit
Admission: RE | Admit: 2013-06-17 | Discharge: 2013-06-17 | Disposition: A | Discharge status: Home / Self Care | Payer: Medicare Other | Source: Ambulatory Visit | Attending: Radiation Oncology | Admitting: Radiation Oncology

## 2013-06-17 ENCOUNTER — Encounter: Payer: Self-pay | Admitting: Radiation Oncology

## 2013-06-18 ENCOUNTER — Ambulatory Visit
Admission: RE | Admit: 2013-06-18 | Discharge: 2013-06-18 | Disposition: A | Discharge status: Home / Self Care | Payer: Medicare Other | Source: Ambulatory Visit | Attending: Radiation Oncology | Admitting: Radiation Oncology

## 2013-06-19 ENCOUNTER — Encounter: Payer: Self-pay | Admitting: Radiation Oncology

## 2013-06-19 ENCOUNTER — Ambulatory Visit
Admission: RE | Admit: 2013-06-19 | Discharge: 2013-06-19 | Disposition: A | Discharge status: Home / Self Care | Payer: Medicare Other | Source: Ambulatory Visit | Attending: Radiation Oncology | Admitting: Radiation Oncology

## 2013-06-19 VITALS — BP 153/84 | HR 79 | Temp 98.0°F | Ht 61.0 in | Wt 140.7 lb

## 2013-06-19 DIAGNOSIS — C50412 Malignant neoplasm of upper-outer quadrant of left female breast: Secondary | ICD-10-CM

## 2013-06-19 NOTE — Progress Notes (Signed)
Alexandra Snyder has completed 17/17 fractions to her whole breast and started her boost filed today ( 1/3).  Bright erythema noted, especially, in the left lower axillary region,  and left inframmary.  Note rash like appearance in the left, upper inner portion of the treatment with "some" itching.  Her skin is intact.  He notes fatigue, but has not changed her activities.

## 2013-06-19 NOTE — Progress Notes (Signed)
   Department of Radiation Oncology  Phone:  [phone] Fax:        [phone]  Weekly Treatment Note    Name: Alexandra Snyder Date: 06/19/2013 MRN: [id number] DOB: Jan 09, 1934   Current dose: 45 Gy  Current fraction: 18   MEDICATIONS: Current Outpatient Prescriptions  Medication Sig Dispense Refill  . acetaminophen (TYLENOL) 325 MG tablet Take 650 mg by mouth every 6 (six) hours as needed for pain or fever.      Marland Kitchen albuterol (PROVENTIL HFA;VENTOLIN HFA) 108 (90 BASE) MCG/ACT inhaler Inhale 2 puffs into the lungs every 6 (six) hours as needed for wheezing.      . cholecalciferol (VITAMIN D) 1000 UNITS tablet Take 1,000 Units by mouth 2 (two) times daily.      . hyaluronate sodium (RADIAPLEXRX) GEL Apply topically 2 (two) times daily.      Marland Kitchen ibuprofen (ADVIL,MOTRIN) 200 MG tablet Take 200 mg by mouth every 6 (six) hours as needed for pain, fever or headache.      . mometasone-formoterol (DULERA) 100-5 MCG/ACT AERO Inhale 2 puffs into the lungs 2 (two) times daily as needed for wheezing.      . rosuvastatin (CRESTOR) 20 MG tablet Take 20 mg by mouth daily.       No current facility-administered medications for this encounter.     ALLERGIES: Celebrex   LABORATORY DATA:  Lab Results  Component Value Date   WBC 9.7 05/06/2013   HGB 12.2 05/06/2013   HCT 36.9 05/06/2013   MCV 85.0 05/06/2013   PLT 263 05/06/2013   Lab Results  Component Value Date   NA 141 05/06/2013   K 4.1 05/06/2013   CL 103 05/06/2013   CO2 25 05/06/2013   Lab Results  Component Value Date   ALT 15 05/06/2013   AST 18 05/06/2013   ALKPHOS 104 05/06/2013   BILITOT 0.31 05/06/2013     NARRATIVE: Alexandra Snyder was seen today for weekly treatment management. The chart was checked and the patient's films were reviewed. The patient is doing well. She is using skin cream several times a day. No significant complaints at this point.  PHYSICAL EXAMINATION: height is 5\' 1"  (1.549 m) and weight is 140 lb 11.2 oz  (63.821 kg). Her temperature is 98 F (36.7 C). Her blood pressure is 153/84 and her pulse is 79.      her skin looks good for where we are in her treatment. Some hyperpigmentation and diffuse erythema. A little bit of desquamation, especially in the inframammary region. No moist desquamation.  ASSESSMENT: The patient is doing satisfactorily with treatment.  PLAN: We will continue with the patient's radiation treatment as planned. She is doing excellent and will continue her current skin care. I will see her 1 month after finishing treatment.

## 2013-06-20 ENCOUNTER — Ambulatory Visit
Admission: RE | Admit: 2013-06-20 | Discharge: 2013-06-20 | Disposition: A | Discharge status: Home / Self Care | Payer: Medicare Other | Source: Ambulatory Visit | Attending: Radiation Oncology | Admitting: Radiation Oncology

## 2013-06-23 ENCOUNTER — Encounter: Payer: Self-pay | Admitting: Radiation Oncology

## 2013-06-23 ENCOUNTER — Ambulatory Visit
Admission: RE | Admit: 2013-06-23 | Discharge: 2013-06-23 | Disposition: A | Discharge status: Home / Self Care | Payer: Medicare Other | Source: Ambulatory Visit | Attending: Radiation Oncology | Admitting: Radiation Oncology

## 2013-06-25 ENCOUNTER — Ambulatory Visit: Payer: 59 | Admitting: Family Medicine

## 2013-06-25 ENCOUNTER — Encounter: Payer: Self-pay | Admitting: Family Medicine

## 2013-06-25 ENCOUNTER — Ambulatory Visit: Payer: 59

## 2013-06-25 VITALS — BP 130/80 | HR 72 | Temp 98.0°F | Resp 16 | Ht 60.0 in | Wt 137.8 lb

## 2013-06-25 DIAGNOSIS — M25569 Pain in unspecified knee: Secondary | ICD-10-CM

## 2013-06-25 DIAGNOSIS — G8929 Other chronic pain: Secondary | ICD-10-CM

## 2013-06-25 DIAGNOSIS — M25562 Pain in left knee: Secondary | ICD-10-CM

## 2013-06-25 DIAGNOSIS — M25559 Pain in unspecified hip: Secondary | ICD-10-CM

## 2013-06-25 MED ORDER — NAPROXEN 500 MG PO TABS: 500.0000 mg | ORAL_TABLET | Freq: Two times a day (BID) | ORAL | Status: DC

## 2013-06-25 NOTE — Patient Instructions (Addendum)
Piriformis Syndrome with Rehab Piriformis syndrome is a condition the affects the nervous system in the area of the hip, and is characterized by pain and possibly a loss of feeling in the backside (posterior) thigh that may extend down the entire length of the leg. The symptoms are caused by an increase in pressure on the sciatic nerve by the piriformis muscle, which is on the back of the hip and is responsible for externally rotating the hip. The sciatic nerve and its branches connect to much of the leg. Normally the sciatic nerve runs between the piriformis muscle and other muscles. However, in certain individuals the nerve runs through the muscle, which causes an increase in pressure on the nerve and results in the symptoms of piriformis syndrome. SYMPTOMS   Pain, tingling, numbness, or burning in the back of the thigh that may also extend down the entire leg.  Occasionally, tenderness in the buttock.  Loss of function of the leg.  Pain that worsens when using the piriformis muscle (running, jumping, or stairs).  Pain that increases with prolonged sitting.  Pain that is lessened by laying flat on the back. CAUSES   Piriformis syndrome is the result of an increase in pressure placed on the sciatic nerve. Often times piriformis syndrome is an overuse injury.  Stress placed on the nerve from a sudden increase in the intensity, frequency, or duration of training.  Compensation of other extremity injuries. RISK INCREASES WITH:  Sports that involve the piriformis muscle (running, walking or jumping).  You are born with (congenital) a defect in which the sciatic nerve passes through the muscle. PREVENTION  Warm up and stretch properly before activity.  Allow for adequate recovery between workouts.  Maintain physical fitness:  Strength, flexibility, and endurance.  Cardiovascular fitness. PROGNOSIS  If treated properly, then the symptoms of piriformis syndrome usually resolve in 2  to 6 weeks. RELATED COMPLICATIONS   Persistent and possibly permanent pain and numbness in the lower extremity.  Weakness of the extremity that may progress to disability and inability to compete. TREATMENT  The most effective treatment for piriformis syndrome is rest from any activities that aggravate the symptoms. Ice and pain medication may help reduce pain and inflammation. The use of strengthening and stretching exercises may help reduce pain with activity. These exercises may be performed at home or with a therapist. A referral to a therapist may be given for further evaluation and treatment, such as ultrasound. Corticosteroid injections may be given to reduce inflammation that is causing pressure to be placed on the sciatic nerve. If non-surgical (conservative) treatment is unsuccessful, then surgery may be recommended.  MEDICATION   If pain medication is necessary, then nonsteroidal anti-inflammatory medications, such as aspirin and ibuprofen, or other minor pain relievers, such as acetaminophen, are often recommended.  Do not take pain medication for 7 days before surgery.  Prescription pain relievers may be given if deemed necessary by your caregiver. Use only as directed and only as much as you need.  Corticosteroid injections may be given by your caregiver. These injections should be reserved for the most serious cases, because they may only be given a certain number of times. HEAT AND COLD:   Cold treatment (icing) relieves pain and reduces inflammation. Cold treatment should be applied for 10 to 15 minutes every 2 to 3 hours for inflammation and pain and immediately after any activity that aggravates your symptoms. Use ice packs or massage the area with a piece of ice (ice massage).    Heat treatment may be used prior to performing the stretching and strengthening activities prescribed by your caregiver, physical therapist, or athletic trainer. Use a heat pack or soak the injury in  warm water. SEEK IMMEDIATE MEDICAL CARE IF:  Treatment seems to offer no benefit, or the condition worsens.  Any medications produce adverse side effects. EXERCISES RANGE OF MOTION (ROM) AND STRETCHING EXERCISES - Piriformis Syndrome These exercises may help you when beginning to rehabilitate your injury. Your symptoms may resolve with or without further involvement from your physician, physical therapist or athletic trainer. While completing these exercises, remember:   Restoring tissue flexibility helps normal motion to return to the joints. This allows healthier, less painful movement and activity.  An effective stretch should be held for at least 30 seconds.  A stretch should never be painful. You should only feel a gentle lengthening or release in the stretched tissue. STRETCH - Hip Rotators  Lie on your back on a firm surface. Grasp your right / left knee with your right / left hand and your ankle with your opposite hand.  Keeping your hips and shoulders firmly planted, gently pull your right / left knee and rotate your lower leg toward your opposite shoulder until you feel a stretch in your buttocks.  Hold this stretch for __________ seconds. Repeat this stretch __________ times. Complete this stretch __________ times per day. STRETCH  Iliotibial Band  On the floor or bed, lie on your side so your right / left leg is on top. Bend your knee and grab your ankle.  Slowly bring your knee back so that your thigh is in line with your trunk. Keep your heel at your buttocks and gently arch your back so your head, shoulders and hips line up.  Slowly lower your leg so that your knee approaches the floor/bed until you feel a gentle stretch on the outside of your right / left thigh. If you do not feel a stretch and your knee will not fall farther, place the heel of your opposite foot on top of your knee and pull your thigh down farther.  Hold this stretch for __________ seconds. Repeat  __________ times. Complete __________ times per day. STRENGTHENING EXERCISES - Piriformis Syndrome  These are some of the caregiver again or until your symptoms are resolved. Remember:   Strong muscles with good endurance tolerate stress better.  Do the exercises as initially prescribed by your caregiver. Progress slowly with each exercise, gradually increasing the number of repetitions and weight used under their guidance. STRENGTH - Hip Abductors, Straight Leg Raises Be aware of your form throughout the entire exercise so that you exercise the correct muscles. Sloppy form means that you are not strengthening the correct muscles.  Lie on your side so that your head, shoulders, knee and hip line up. You may bend your lower knee to help maintain your balance. Your right / left leg should be on top.  Roll your hips slightly forward, so that your hips are stacked directly over each other and your right / left knee is facing forward.  Lift your top leg up 4-6 inches, leading with your heel. Be sure that your foot does not drift forward or that your knee does not roll toward the ceiling.  Hold this position for __________ seconds. You should feel the muscles in your outer hip lifting (you may not notice this until your leg begins to tire).  Slowly lower your leg to the starting position. Allow the muscles to   fully relax before beginning the next repetition. Repeat __________ times. Complete this exercise __________ times per day.  STRENGTH - Hip Abductors, Quadriped  On a firm, lightly padded surface, position yourself on your hands and knees. Your hands should be directly below your shoulders and your knees should be directly below your hips.  Keeping your right / left knee bent, lift your leg out to the side. Keep your legs level and in line with your shoulders.  Position yourself on your hands and knees.  Hold for __________ seconds.  Keeping your trunk steady and your hips level, slowly  lower your leg to the starting position. Repeat __________ times. Complete this exercise __________ times per day.  STRENGTH - Hip Abductors, Standing  Tie one end of a rubber exercise band/tubing to a secure surface (table, pole) and tie a loop at the other end.  Place the loop around your right / left ankle. Keeping your ankle with the band directly opposite of the secured end, step away until there is tension in the tube/band.  Hold onto a chair as needed for balance.  Keeping your back upright, your shoulders over your hips, and your toes pointing forward, lift your right / left leg out to your side. Be sure to lift your leg with your hip muscles. Do not "throw" your leg or tip your body to lift your leg.  Slowly and with control, return to the starting position. Repeat exercise __________ times. Complete this exercise __________ times per day.  Document Released: 12/11/2005 Document Revised: 06/11/2012 Document Reviewed: 03/25/2009 [employer] Patient Information 2014 [employer].  

## 2013-06-25 NOTE — Progress Notes (Signed)
[address]   [place], [place]  [address]   [phone]  Subjective:    Patient ID: Alexandra Snyder, female    DOB: 09-30-1934, 79 y.o.   MRN: [id number]  HPI This 79 y.o. female presents for evaluation of leg pain.  In 12/21/12, dragging in trash can; pulling from behind.  Pulled trash can with L hand; two days later, started having L groin pain.  Still having L groin pain. In 12/2012, went to PCP: received injection in hip without improvement.  S/p breast cancer treatment.  Used cane 12/13 to 4/14 due to persistent pain.  Went to chiropractor for bulging disc and help; advised that unable to help L groin pian; thought might be sciatic nerve problem.  +lower back pain and radiates into L groin and radiates down lateral leg into knee and sometimes into calf.  Unable to cross leg.  No n/t/w; limping with ambulation; afraid of giving out.  Normal b/b function.  No saddle paresthesias.  Took Naproxen for two weeks.  No xrays.  No pain with lying on L hip.  Has problem moving leg over in bed.  PCP: Andi Devon  Review of Systems  Constitutional: Negative for fever, chills, diaphoresis and fatigue.  Genitourinary: Negative for decreased urine volume and difficulty urinating.  Musculoskeletal: Positive for myalgias, back pain, arthralgias and gait problem. Negative for joint swelling.  Skin: Negative for rash.  Neurological: Negative for weakness and numbness.    Past Medical History  Diagnosis Date  . Breast cancer     left  . Arthritis   . Asthma   . COPD (chronic obstructive pulmonary disease)   . GERD (gastroesophageal reflux disease)     Past Surgical History  Procedure Laterality Date  . Eye surgery Bilateral     Cataract removal  . Colonoscopy w/ polypectomy    . Tonsillectomy    . Breast lumpectomy with needle localization Left 04/21/2013    Procedure: LEFT BREAST WIRE GUIDED  LUMPECTOMY ;  Surgeon: Emelia Loron, MD;  Location: [facility] OR;  Service: General;  Laterality:  Left;    Prior to Admission medications   Medication Sig Start Date End Date Taking? Authorizing Provider  acetaminophen (TYLENOL) 325 MG tablet Take 650 mg by mouth every 6 (six) hours as needed for pain or fever.   Yes Historical Provider, MD  albuterol (PROVENTIL HFA;VENTOLIN HFA) 108 (90 BASE) MCG/ACT inhaler Inhale 2 puffs into the lungs every 6 (six) hours as needed for wheezing.   Yes Historical Provider, MD  cholecalciferol (VITAMIN D) 1000 UNITS tablet Take 1,000 Units by mouth 2 (two) times daily.   Yes Historical Provider, MD  hyaluronate sodium (RADIAPLEXRX) GEL Apply topically 2 (two) times daily.   Yes Historical Provider, MD  ibuprofen (ADVIL,MOTRIN) 200 MG tablet Take 200 mg by mouth every 6 (six) hours as needed for pain, fever or headache.   Yes Historical Provider, MD  rosuvastatin (CRESTOR) 20 MG tablet Take 20 mg by mouth daily.   Yes Historical Provider, MD  mometasone-formoterol (DULERA) 100-5 MCG/ACT AERO Inhale 2 puffs into the lungs 2 (two) times daily as needed for wheezing.    Historical Provider, MD    Allergies  Allergen Reactions  . Celebrex (Celecoxib) Itching    Hands itch    History   Social History  . Marital Status: Widowed    Spouse Name: N/A    Number of Children: 0  . Years of Education: N/A   Occupational History  .  Not on file.   Social History Main Topics  . Smoking status: Former Smoker -- 1.00 packs/day for 41 years    Quit date: 05/08/2003  . Smokeless tobacco: Not on file  . Alcohol Use: No  . Drug Use: No  . Sexually Active: Not Currently   Other Topics Concern  . Not on file   Social History Narrative  . No narrative on file    Family History  Problem Relation Age of Onset  . Ovarian cancer Sister        Objective:   Physical Exam  Nursing note and vitals reviewed. Constitutional: She is oriented to person, place, and time. She appears well-developed and well-nourished. No distress.  Abdominal: Soft. Bowel sounds  are normal. She exhibits no distension. There is no tenderness. There is no rebound and no guarding.  Musculoskeletal:       Left hip: She exhibits decreased range of motion. She exhibits normal strength, no tenderness and no bony tenderness.       Lumbar back: She exhibits decreased range of motion, tenderness and pain. She exhibits no bony tenderness, no spasm and normal pulse.  LUMBAR SPINE:  TTP L LUMBAR REGION AT SI JOINT; FULL FLEXION AND EXTENSION YET PAIN REPRODUCED; NORMAL ROTATION AND LATERAL BENDING; MOTOR 5/5; TOE AND HEEL WALKING INTACT; MARCHING DECREASED FLEXION ON L. L HIP:  NON-TENDER; PAIN WITH ROTATION EXTERNALLY AND INTERNAL ROTATION; MOTOR 5/5.  Neurological: She is alert and oriented to person, place, and time.  Skin: Skin is warm and dry. No rash noted. She is not diaphoretic.  Psychiatric: She has a normal mood and affect. Her behavior is normal.    [facility] reading (PRIMARY) by  Dr. Katrinka Blazing.  LS SPINE FILMS: MODERATE DDD MULTIPLE LEVELS; +SCOLIOSIS.  L HIP:  DEGENERATIVE CHANGES; NO ACUTE PROCESS.      Assessment & Plan:  Pain in joint, lower leg, left - Plan: DG Lumbar Spine 2-3 Views  Hip pain, chronic, left - Plan: DG Hip Complete Left   1.  Pain L Hip: New onset.  With associated lower back pain and L groin pain.  Will treat for piriformis syndrome; rx fo Naproxen provided.  Home exercises provided.  Pt to call in one month if no improvement for ortho referral. 2. Lower back pain with radicular pain into leg: New.  With associated L hip and L groin pain.  Rx for Naproxen.  Sciatica exercises provided to perform daily.  Meds ordered this encounter  Medications  . naproxen (NAPROSYN) 500 MG tablet    Sig: Take 1 tablet (500 mg total) by mouth 2 (two) times daily with a meal.    Dispense:  30 tablet    Refill:  0

## 2013-07-08 ENCOUNTER — Ambulatory Visit (HOSPITAL_BASED_OUTPATIENT_CLINIC_OR_DEPARTMENT_OTHER): Payer: Medicare Other | Admitting: Oncology

## 2013-07-08 ENCOUNTER — Encounter: Payer: Self-pay | Admitting: *Deleted

## 2013-07-08 ENCOUNTER — Telehealth: Payer: Self-pay | Admitting: *Deleted

## 2013-07-08 ENCOUNTER — Other Ambulatory Visit: Payer: Self-pay | Admitting: Oncology

## 2013-07-08 VITALS — BP 168/79 | HR 84 | Temp 98.0°F | Resp 18 | Ht 60.0 in | Wt 140.1 lb

## 2013-07-08 DIAGNOSIS — M899 Disorder of bone, unspecified: Secondary | ICD-10-CM

## 2013-07-08 DIAGNOSIS — M8580 Other specified disorders of bone density and structure, unspecified site: Secondary | ICD-10-CM

## 2013-07-08 DIAGNOSIS — C50412 Malignant neoplasm of upper-outer quadrant of left female breast: Secondary | ICD-10-CM

## 2013-07-08 DIAGNOSIS — C50419 Malignant neoplasm of upper-outer quadrant of unspecified female breast: Secondary | ICD-10-CM

## 2013-07-08 MED ORDER — ANASTROZOLE 1 MG PO TABS: 1.0000 mg | ORAL_TABLET | Freq: Every day | ORAL | Status: AC

## 2013-07-08 NOTE — Progress Notes (Signed)
Patient at [facility] for f/u visit with Dr. Welton Flakes.  Patient reports that she is doing well.  She does report that she has some fatigue but she is continuing her normal activities.  She continues to have a few small areas of "sunburn" remaining from her radiation therapy with most of the skin on her breast healed.  Patient denies any other concerns or questions at this time.  Patient encouraged to call for any needs.

## 2013-07-08 NOTE — Progress Notes (Signed)
OFFICE PROGRESS NOTE  CC  Alexandra Snyder., MD [address] [place] [address] [place] [address] Dr. Dorothy Puffer  Dr. Emelia Loron     DIAGNOSIS: 79 year old female with new diagnosis of invasive ductal carcinoma of the left breast diagnosed April 2014  STAGE:  Cancer of upper-outer quadrant of female breast  Primary site: Breast (Left)  Staging method: AJCC 7th Edition  Clinical: Stage IA (T1c, N0, cM0)  Summary: Stage IA (T1c, N0, cM0)   PRIOR THERAPY:  #1 on a recent screening mammogram showing some architectural distortion in the left breast. No changes in the right. She had ultrasound that showed a high protocol with mass at the 12:30 o'clock position within the left breast 6 cm from the nipple measuring 1.2 cm.she had ultrasound-guided biopsy the pathology revealed invasive ductal carcinoma, grade 2, ER positive PR negative HER-2/neu negative with Ki-67 10%  #2 patient is now status post left breast lumpectomy with the final pathology revealing a 0.8 cm invasive ductal carcinoma intermediate grade ER positive PR negative HER-2/neu negative with Ki-67 of 10%.  #3 patient will be referred to radiation oncology. She was originally seen by Dr. Dorothy Puffer during the multidisciplinary breast clinic in April 2014.  #4 patient is status post radiation therapy to the left breast completed on 06/23/2013.  #5 begin arimidex1 milligram daily for a total of 5 years beginning 07/08/2013.  CURRENT THERAPY: arimidex 1 mg daily INTERVAL HISTORY: Alexandra Snyder 79 y.o. female returns for followup visit. Overall she is doing well. She tolerated radiation without any significant problems. She denies any nausea vomiting fevers chills night sweats headaches shortness of breath chest pains palpitations she does have some arthritic pains in her hip. She also tells me that she does develop sciatic nerve pain occasionally. We will need to keep an eye on this. Remainder of the 10 point review of  systems is negative. MEDICAL HISTORY: Past Medical History  Diagnosis Date  . Breast cancer     left  . Arthritis   . Asthma   . COPD (chronic obstructive pulmonary disease)   . GERD (gastroesophageal reflux disease)     ALLERGIES:  is allergic to celebrex.  MEDICATIONS:  Current Outpatient Prescriptions  Medication Sig Dispense Refill  . acetaminophen (TYLENOL) 325 MG tablet Take 650 mg by mouth every 6 (six) hours as needed for pain or fever.      Marland Kitchen albuterol (PROVENTIL HFA;VENTOLIN HFA) 108 (90 BASE) MCG/ACT inhaler Inhale 2 puffs into the lungs every 6 (six) hours as needed for wheezing.      . cholecalciferol (VITAMIN D) 1000 UNITS tablet Take 1,000 Units by mouth 2 (two) times daily.      . hyaluronate sodium (RADIAPLEXRX) GEL Apply topically 2 (two) times daily.      . naproxen (NAPROSYN) 500 MG tablet Take 1 tablet (500 mg total) by mouth 2 (two) times daily with a meal.  30 tablet  0  . rosuvastatin (CRESTOR) 20 MG tablet Take 20 mg by mouth daily.      Marland Kitchen ibuprofen (ADVIL,MOTRIN) 200 MG tablet Take 200 mg by mouth every 6 (six) hours as needed for pain, fever or headache.      . mometasone-formoterol (DULERA) 100-5 MCG/ACT AERO Inhale 2 puffs into the lungs 2 (two) times daily as needed for wheezing.       No current facility-administered medications for this visit.    SURGICAL HISTORY:  Past Surgical History  Procedure Laterality Date  . Eye surgery  Bilateral     Cataract removal  . Colonoscopy w/ polypectomy    . Tonsillectomy    . Breast lumpectomy with needle localization Left 04/21/2013    Procedure: LEFT BREAST WIRE GUIDED  LUMPECTOMY ;  Surgeon: Emelia Loron, MD;  Location: [facility] OR;  Service: General;  Laterality: Left;    REVIEW OF SYSTEMS:  Pertinent items are noted in HPI.   HEALTH MAINTENANCE:   PHYSICAL EXAMINATION: Blood pressure 168/79, pulse 84, temperature 98 F (36.7 C), temperature source Oral, resp. rate 18, height 5' (1.524 m), weight 140  lb 1.6 oz (63.549 kg). Body mass index is 27.36 kg/(m^2). ECOG PERFORMANCE STATUS: 0 - Asymptomatic  Well-developed nourished female appears younger than her stated age HEENT exam EOMI PERRLA sclerae anicteric no conjunctival pallor oral mucosa is moist no mucositis neck is supple lungs clear bilaterally cardiovascular regular rate rhythm abdomen soft nontender no HSM extremities no edema neuro nonfocal breast exam left breast reveals healing surgical scar no nipple discharge no redness or erythema. Right breast no masses or nipple discharge.     LABORATORY DATA: Lab Results  Component Value Date   WBC 9.7 05/06/2013   HGB 12.2 05/06/2013   HCT 36.9 05/06/2013   MCV 85.0 05/06/2013   PLT 263 05/06/2013      Chemistry      Component Value Date/Time   NA 141 05/06/2013 0923   NA 142 04/18/2013 0853   K 4.1 05/06/2013 0923   K 4.0 04/18/2013 0853   CL 103 05/06/2013 0923   CL 104 04/18/2013 0853   CO2 25 05/06/2013 0923   CO2 29 04/18/2013 0853   BUN 15.2 05/06/2013 0923   BUN 16 04/18/2013 0853   CREATININE 0.9 05/06/2013 0923   CREATININE 0.83 04/18/2013 0853      Component Value Date/Time   CALCIUM 9.7 05/06/2013 0923   CALCIUM 9.9 04/18/2013 0853   ALKPHOS 104 05/06/2013 0923   AST 18 05/06/2013 0923   ALT 15 05/06/2013 0923   BILITOT 0.31 05/06/2013 0923     ADDITIONAL INFORMATION: CHROMOGENIC IN-SITU HYBRIDIZATION Results: HER-2/NEU BY [facility] - NO AMPLIFICATION OF HER-2 DETECTED. RESULT RATIO OF HER2: CEP 17 SIGNALS 1.08 AVERAGE HER2 COPY NUMBER PER CELL 1.3 REFERENCE RANGE NEGATIVE HER2/Chr17 Ratio <2.0 and Average HER2 copy number <4.0 EQUIVOCAL HER2/Chr17 Ratio <2.0 and Average HER2 copy number 4.0 and <6.0 POSITIVE HER2/Chr17 Ratio >=2.0 and/or Average HER2 copy number >=6.0 [name] MD Pathologist, Electronic Signature ( Signed 04/29/2013) FINAL DIAGNOSIS Diagnosis Breast, lumpectomy, left - INVASIVE DUCTAL CARCINOMA, GRADE I/III, SPANNING 0.8 CM. - DUCTAL CARCINOMA  IN SITU, LOW GRADE. - LOBULAR NEOPLASIA (ATYPICAL LOBULAR HYPERPLASIA). - THE SURGICAL RESECTION MARGINS ARE NEGATIVE FOR CARCINOMA. - SEE ONCOLOGY TABLE BELOW. Microscopic Comment BREAST, INVASIVE TUMOR, WITHOUT LYMPH NODE SAMPLING 1 of 3 FINAL for Maret, Caprice J ([id number]) Microscopic Comment(continued) Specimen, including laterality: Left breast Procedure: Lumpectomy Grade: I Tubule formation: II Nuclear pleomorphism: I Mitotic: I Tumor size (gross measurement): 0.8 cm Margins: Negative for carcinoma Invasive, distance to closest margin: 0.5 cm to anterior margin (gross measurement) In-situ, distance to closest margin: 0.5 cm to anterior margin (gross measurement) Lymphovascular invasion: Not identified Ductal carcinoma in situ: Present Grade: Low grade Extensive intraductal component: Present Lobular neoplasia: Present (atypical lobular hyperplasia) Tumor focality: Unifocal Treatment effect: N/A Extent of tumor: Confined to breast parenchyma Breast prognostic profile: [id number] Estrogen receptor: 100%, strong staining intensity Progesterone receptor: 0% Her 2 neu: No amplification was detected. The ratio was 0.85.  Ki-67: 10% Non-neoplastic breast: Healing biopsy site TNM: pT1b, pNX Comments: Her 2 neu by [facility] will be repeated on the current case and the results reported separately (JBK:caf 04/24/13) Pecola Leisure MD Pathologist, Electronic Signature (Case signed 04/24/2013) Specimen Gross and Clinical Information   RADIOGRAPHIC STUDIES:  Dg Chest 2 View  04/18/2013  *RADIOLOGY REPORT*  Clinical Data: Preoperative chest x-ray, history of breast cancer, smoker  CHEST - 2 VIEW  Comparison: None.  Findings: The lungs are hyperinflated and there are central bronchitic changes suggesting underlying COPD.  Additionally, the upper lungs appear emphysematous.  No focal airspace consolidation, pulmonary edema, pleural effusion or pneumothorax.  No suspicious pulmonary nodule.   Cardiac and mediastinal contours within normal limits.  There is mild aortic atherosclerotic calcification. Dextroconvex scoliosis of the lumbar spine incompletely imaged. No acute osseous abnormality.  IMPRESSION:  1.  No acute cardiopulmonary disease 2.  COPD/emphysema 3.  Mild aortic atherosclerosis   Original Report Authenticated By: Malachy Moan, M.D.     ASSESSMENT: 79 year old female with  #1 new diagnosis of stage I (T1 Nx) invasive ductal carcinoma of the left breast measuring 0.8 cm, ER positive PR positive HER-2/neu negative with Ki-67 10%. Patient is now status post lumpectomy performed April 2014. She tolerated the procedure well without any problems.  #2 patient will initially proceed with adjuvant radiation therapy. She has an appointment coming up with Dr. Dorothy Puffer. Once she completes this I will begin her on antiestrogen therapy adjuvantly with Arimidex 1 mg daily we discussed rationale risks benefits and side effects.  #3 patient is now completed radiation therapy to the left breast. Overall she tolerated it well she has minimal tenderness and minimal redness.   PLAN:  #1 you have now completed radiation therapy. Overall you are doing well and healing well from the radiation.  #2 we will now begin antiestrogen therapy with Arimidex 1 mg daily. He will receive a total of 5 years of this. We discussed the risks and benefits and side effects. More information is as below.  #3 because her Arimidex can cause bone loss I have ordered a bone density scan for you. If you have osteopenia or low bone mass we will put you on Fosamax 35 mg once a week. If there is osteoporosis than the Fosamax dose will be 70 mg a week. We will discuss this further once bone density results are known to me.  #4 I will plan on seeing you back in 3 months time for followup. However please call if there are any problems questions or concerns.   All questions were answered. The patient knows to call  the clinic with any problems, questions or concerns. We can certainly see the patient much sooner if necessary.  I spent 25 minutes counseling the patient face to face. The total time spent in the appointment was 30 minutes.    Drue Second, MD Medical/Oncology [facility] [phone] (beeper) [phone] (Office)  07/08/2013, 9:54 AM

## 2013-07-08 NOTE — Patient Instructions (Addendum)
#  1 you have now completed radiation therapy. Overall you are doing well and healing well from the radiation.  #2 we will now begin antiestrogen therapy with Arimidex 1 mg daily. He will receive a total of 5 years of this. We discussed the risks and benefits and side effects. More information is as below.  #3 because her Arimidex can cause bone loss I have ordered a bone density scan for you. If you have osteopenia or low bone mass we will put you on Fosamax 35 mg once a week. If there is osteoporosis than the Fosamax dose will be 70 mg a week. We will discuss this further once bone density results are known to me.  #4 I will plan on seeing you back in 3 months time for followup. However please call if there are any problems questions or concerns.

## 2013-07-08 NOTE — Telephone Encounter (Signed)
appts made and printed. gv appt for [facility] for 07/18/13 @ 10:30am...td

## 2013-07-11 NOTE — Progress Notes (Signed)
  Radiation Oncology         [phone] ________________________________  Name: Alexandra Snyder MRN: [id number]  Date: 06/17/2013  DOB: 1934-02-07  Complex simulation note  The patient has undergone complex simulation for her upcoming boost treatment for her diagnosis of breast cancer. The patient has initially been planned to receive 42.5 gray. The patient will now receive a 7.5 gray boost to the seroma cavity which has been contoured. This will be accomplished using an en face electron field. Based on the depth of the target area, 15 MeV electrons will be used and this field has been normalized to the 97% isodose line. The patient's final total dose therefore will be 50 gray. A special port plan is requested for the boost treatment.   _______________________________  Radene Gunning, MD, PhD

## 2013-07-11 NOTE — Progress Notes (Signed)
  Radiation Oncology         [phone] ________________________________  Name: Alexandra Snyder MRN: [id number]  Date: 06/23/2013  DOB: 05-06-1934  End of Treatment Note  Diagnosis:   Left-sided breast cancer     Indication for treatment:  curative       Radiation treatment dates:   05/27/13 - 06/23/13  Site/dose:   The patient received whole breast radiotherapy using tangent fields to 42.5 Gy at 2.5 Gy per fraction. She then received a boost to the seroma to 7.5 Gy at 2.5 Gy per fraction using en face electrons. The total dose was 50 Gy.  Narrative: The patient tolerated radiation treatment relatively well.   The patient had moderate skin irritation at the end of treatment.  Plan: The patient has completed radiation treatment. The patient will return to radiation oncology clinic for routine followup in one month. I advised the patient to call or return sooner if they have any questions or concerns related to their recovery or treatment. ________________________________  Radene Gunning, M.D., Ph.D.

## 2013-07-11 NOTE — Progress Notes (Signed)
  Radiation Oncology         [phone] ________________________________  Name: Alexandra Snyder MRN: [id number]  Date: 05/26/2013  DOB: 23-Mar-1934  Simulation Verification Note   NARRATIVE: The patient was brought to the treatment unit and placed in the planned treatment position. The clinical setup was verified. Then port films were obtained and uploaded to the radiation oncology medical record software.  The treatment beams were carefully compared against the planned radiation fields. The position, location, and shape of the radiation fields was reviewed. The targeted volume of tissue appears to be appropriately covered by the radiation beams. Based on my personal review, I approved the simulation verification. The patient's treatment will proceed as planned.  ________________________________   Radene Gunning, MD, PhD

## 2013-07-22 ENCOUNTER — Encounter: Payer: Self-pay | Admitting: Oncology

## 2013-07-23 ENCOUNTER — Ambulatory Visit
Admission: RE | Admit: 2013-07-23 | Discharge: 2013-07-23 | Disposition: A | Discharge status: Home / Self Care | Payer: Medicare Other | Source: Ambulatory Visit | Attending: Radiation Oncology | Admitting: Radiation Oncology

## 2013-07-23 ENCOUNTER — Encounter: Payer: Self-pay | Admitting: Radiation Oncology

## 2013-07-23 VITALS — BP 158/85 | HR 77 | Temp 98.3°F | Ht 60.0 in | Wt 137.5 lb

## 2013-07-23 DIAGNOSIS — C50412 Malignant neoplasm of upper-outer quadrant of left female breast: Secondary | ICD-10-CM

## 2013-07-23 NOTE — Progress Notes (Signed)
Radiation Oncology         [phone] ________________________________  Name: Alexandra Snyder MRN: [id number]  Date: 07/23/2013  DOB: April 04, 1934  Follow-Up Visit Note  CC: [name], MD  [name], MD  Diagnosis:   Left-sided breast cancer  Interval Since Last Radiation:  One month   Narrative:  The patient returns today for routine follow-up.  The patient completed her course of radiotherapy on 06/23/2013. She has done well with treatment. Her energy level has improved. She has begun anti-hormonal treatment without any difficulties so far.                              ALLERGIES:  is allergic to celebrex.  Meds: Current Outpatient Prescriptions  Medication Sig Dispense Refill  . acetaminophen (TYLENOL) 325 MG tablet Take 650 mg by mouth every 6 (six) hours as needed for pain or fever.      Marland Kitchen albuterol (PROVENTIL HFA;VENTOLIN HFA) 108 (90 BASE) MCG/ACT inhaler Inhale 2 puffs into the lungs every 6 (six) hours as needed for wheezing.      Marland Kitchen anastrozole (ARIMIDEX) 1 MG tablet Take 1 tablet (1 mg total) by mouth daily.  90 tablet  12  . cholecalciferol (VITAMIN D) 1000 UNITS tablet Take 1,000 Units by mouth 2 (two) times daily.      . hyaluronate sodium (RADIAPLEXRX) GEL Apply topically 2 (two) times daily.      Marland Kitchen ibuprofen (ADVIL,MOTRIN) 200 MG tablet Take 200 mg by mouth every 6 (six) hours as needed for pain, fever or headache.      . mometasone-formoterol (DULERA) 100-5 MCG/ACT AERO Inhale 2 puffs into the lungs 2 (two) times daily as needed for wheezing.      . naproxen (NAPROSYN) 500 MG tablet Take 1 tablet (500 mg total) by mouth 2 (two) times daily with a meal.  30 tablet  0  . rosuvastatin (CRESTOR) 20 MG tablet Take 20 mg by mouth daily.       No current facility-administered medications for this encounter.    Physical Findings: The patient is in no acute distress. Patient is alert and oriented.  height is 5' (1.524 m) and weight is 137 lb 8 oz (62.37  kg). Her temperature is 98.3 F (36.8 C). Her blood pressure is 158/85 and her pulse is 77. .   The patient's skin looks good. Mild hyperpigmentation pains.  Lab Findings: Lab Results  Component Value Date   WBC 9.7 05/06/2013   HGB 12.2 05/06/2013   HCT 36.9 05/06/2013   MCV 85.0 05/06/2013   PLT 263 05/06/2013     Radiographic Findings: Dg Lumbar Spine 2-3 Views  06/25/2013   *RADIOLOGY REPORT*  Clinical Data: Low back pain radiating to left groin and down lateral leg to knee and sometimes into calf  LUMBAR SPINE - 2-3 VIEW  Comparison: None Correlation:  MRI lumbar spine 07/30/2009  Findings: Osseous demineralization. Five non-rib bearing lumbar vertebrae. Dextroconvex lumbar scoliosis. Superior endplate height loss at L4 grossly unchanged since prior MR. Multilevel disc space narrowing and facet degenerative changes. No additional fracture, subluxation or bone destruction. SI joints symmetric.  IMPRESSION: Osseous demineralization Old superior endplate compression deformity L4. Multilevel degenerative disc and facet disease changes with dextroconvex lumbar scoliosis.   Original Report Authenticated By: Ulyses Southward, M.D.   Dg Hip Complete Left  06/25/2013   *RADIOLOGY REPORT*  Clinical Data: , low back pain radiating to left  groin and down lateral leg to knee and sometimes to calf  LEFT HIP - COMPLETE 2+ VIEW  Comparison: None  Findings: Osteoarthritic changes of bilateral hip joints with joint space narrowing and spur formation greater on left. Bone on bone appearance at superior aspect of left hip joint. Diffuse osseous demineralization. Degenerative disc and facet disease changes lower lumbar spine. SI joints symmetric. No definite acute fracture, dislocation or additional bone destruction.  IMPRESSION: Osseous demineralization. Significant osteoarthritic changes of bilateral hip joints greater on the left. Degenerative disc and facet disease changes lower lumbar spine.   Original Report  Authenticated By: Ulyses Southward, M.D.    Impression/Plan:    The patient is doing well 1 month after completing her course of adjuvant radiotherapy for breast cancer. The patient will to clinic on a when necessary basis.    Radene Gunning, M.D., Ph.D.

## 2013-07-23 NOTE — Progress Notes (Addendum)
Ms. Wisnewski here for assessment s/p radiation therapy for breast cancer which completed on 06/23/13.  Her breast demonstrates mild redness, but is soft and intact.  She is concerned about to raised rough patches on her left breast which look like keratoses and are located in the left upper,inner breast and the left inferior outer portion of the left breast.    She states that she has days that her energy level is lower, but she states when she is round others she does not experience  Fatigue.  If she feels "low", as in mood,  she gets in her car and go shopping.  She admits that 2 weeks ago she had periods in which she cried a lot, but recently she has not had any issues with this and has discussed this with Dr. Welton Flakes in the past.  "I do not need any medication for this."

## 2013-08-27 ENCOUNTER — Other Ambulatory Visit: Payer: Self-pay | Admitting: Medical Oncology

## 2013-08-27 ENCOUNTER — Encounter: Payer: Self-pay | Admitting: Medical Oncology

## 2013-08-27 MED ORDER — ANASTROZOLE 1 MG PO TABS
1.0000 mg | ORAL_TABLET | Freq: Every day | ORAL | Status: DC
Start: 2013-08-27 — End: 2013-11-07

## 2013-08-27 NOTE — Telephone Encounter (Signed)
Patient called requesting to have her [facility] changed to [facility] and have arimidex 1 mg prescription filled through [facility]. Pharmacy listed changed per pt request and per MD note, pt to take arimidex 1 mg daily, prescription escribed. Patient informed. Knows to call office with any questions or concerns,.

## 2013-08-27 NOTE — Progress Notes (Signed)
Pt called to update pharmacy to [facility]. Updated in computer.

## 2013-08-28 ENCOUNTER — Telehealth: Payer: Self-pay

## 2013-08-28 NOTE — Telephone Encounter (Signed)
Request given to xray 

## 2013-08-28 NOTE — Telephone Encounter (Signed)
PT STATES SHE IS IN NEED OF HER XRAYS,. PLEASE CALL [phone] AND HER APPT ISN'T UNTIL October.

## 2013-10-13 ENCOUNTER — Ambulatory Visit: Payer: Medicare Other | Admitting: Oncology

## 2013-10-13 ENCOUNTER — Other Ambulatory Visit: Payer: Medicare Other | Admitting: Lab

## 2013-10-21 ENCOUNTER — Telehealth: Payer: Self-pay | Admitting: Oncology

## 2013-10-21 NOTE — Telephone Encounter (Signed)
, °

## 2013-10-28 ENCOUNTER — Ambulatory Visit (HOSPITAL_BASED_OUTPATIENT_CLINIC_OR_DEPARTMENT_OTHER): Payer: Medicare Other | Admitting: Family

## 2013-10-28 ENCOUNTER — Encounter: Payer: Self-pay | Admitting: *Deleted

## 2013-10-28 ENCOUNTER — Encounter (INDEPENDENT_AMBULATORY_CARE_PROVIDER_SITE_OTHER): Payer: Self-pay

## 2013-10-28 ENCOUNTER — Telehealth: Payer: Self-pay | Admitting: Oncology

## 2013-10-28 ENCOUNTER — Other Ambulatory Visit (HOSPITAL_BASED_OUTPATIENT_CLINIC_OR_DEPARTMENT_OTHER): Payer: Medicare Other | Admitting: Lab

## 2013-10-28 ENCOUNTER — Encounter: Payer: Self-pay | Admitting: Family

## 2013-10-28 VITALS — BP 182/84 | HR 97 | Temp 98.3°F | Resp 18 | Ht 60.0 in | Wt 136.7 lb

## 2013-10-28 DIAGNOSIS — C50419 Malignant neoplasm of upper-outer quadrant of unspecified female breast: Secondary | ICD-10-CM

## 2013-10-28 DIAGNOSIS — C50412 Malignant neoplasm of upper-outer quadrant of left female breast: Secondary | ICD-10-CM

## 2013-10-28 DIAGNOSIS — M810 Age-related osteoporosis without current pathological fracture: Secondary | ICD-10-CM

## 2013-10-28 LAB — CBC WITH DIFFERENTIAL/PLATELET
Basophils Absolute: 0 10*3/uL (ref 0.0–0.1)
EOS%: 1.8 % (ref 0.0–7.0)
HCT: 36.4 % (ref 34.8–46.6)
HGB: 11.9 g/dL (ref 11.6–15.9)
LYMPH%: 21.6 % (ref 14.0–49.7)
MCH: 28.8 pg (ref 25.1–34.0)
MCV: 88.6 fL (ref 79.5–101.0)
MONO%: 13 % (ref 0.0–14.0)
NEUT%: 63.2 % (ref 38.4–76.8)
Platelets: 223 10*3/uL (ref 145–400)
RDW: 14.9 % — ABNORMAL HIGH (ref 11.2–14.5)
lymph#: 1.6 10*3/uL (ref 0.9–3.3)

## 2013-10-28 LAB — COMPREHENSIVE METABOLIC PANEL (CC13)
ALT: 11 U/L (ref 0–55)
AST: 16 U/L (ref 5–34)
Albumin: 3.9 g/dL (ref 3.5–5.0)
Alkaline Phosphatase: 71 U/L (ref 40–150)
BUN: 19.3 mg/dL (ref 7.0–26.0)
Creatinine: 0.8 mg/dL (ref 0.6–1.1)
Potassium: 4.2 mEq/L (ref 3.5–5.1)
Total Bilirubin: 0.36 mg/dL (ref 0.20–1.20)

## 2013-10-28 NOTE — Patient Instructions (Addendum)
Please contact us at [phone] if you have any questions or concerns.  Please continue to do well and enjoy life!!!  Get plenty of rest, drink plenty of water, exercise daily (walking as tolerated), eat a balanced diet.  Continue to take vitamin D3 1000 IUs daily.   Complete monthly self-breast examinations.  Have a clinical breast exam by a physician every year.  Have your mammogram completed every year.  Results for orders placed in visit on 10/28/13 (from the past 24 hour(s))  CBC WITH DIFFERENTIAL     Status: Abnormal   Collection Time    10/28/13  9:46 AM      Result Value Range   WBC 7.6  3.9 - 10.3 10e3/uL   NEUT# 4.8  1.5 - 6.5 10e3/uL   HGB 11.9  11.6 - 15.9 g/dL   HCT 16.1  09.6 - 04.5 %   Platelets 223  145 - 400 10e3/uL   MCV 88.6  79.5 - 101.0 fL   MCH 28.8  25.1 - 34.0 pg   MCHC 32.5  31.5 - 36.0 g/dL   RBC 4.09  8.11 - 9.14 10e6/uL   RDW 14.9 (*) 11.2 - 14.5 %   lymph# 1.6  0.9 - 3.3 10e3/uL   MONO# 1.0 (*) 0.1 - 0.9 10e3/uL   Eosinophils Absolute 0.1  0.0 - 0.5 10e3/uL   Basophils Absolute 0.0  0.0 - 0.1 10e3/uL   NEUT% 63.2  38.4 - 76.8 %   LYMPH% 21.6  14.0 - 49.7 %   MONO% 13.0  0.0 - 14.0 %   EOS% 1.8  0.0 - 7.0 %   BASO% 0.4  0.0 - 2.0 %   Narrative:    Performed At:  [facility]               [address]               [place], [place] [address]      Alendronate weekly tablets (FOSAMAX) What is this medicine? ALENDRONATE (a LEN droe nate) slows calcium loss from bones. It helps to make healthy bone and to slow bone loss in people with osteoporosis. It may be used to treat Paget's disease. This medicine may be used for other purposes; ask your health care provider or pharmacist if you have questions.  What should I tell my health care provider before I take this medicine? They need to know if you have any of these conditions: -esophagus, stomach, or intestine problems, like acid-reflux or GERD -dental disease -kidney  disease -low blood calcium -low vitamin D -problems swallowing -problems sitting or standing for 30 minutes -an unusual or allergic reaction to alendronate, other medicines, foods, dyes, or preservatives -pregnant or trying to get pregnant -breast-feeding  How should I use this medicine? You must take this medicine exactly as directed or you will lower the amount of medicine you absorb into your body or you may cause yourself harm. Take your dose by mouth first thing in the morning, after you are up for the day. Do not eat or drink anything before you take this medicine. Swallow your medicine with a full glass (6 to 8 fluid ounces) of plain water. Do not take this tablet with any other drink. Do not chew or crush the tablet. After taking this medicine, do not eat breakfast, drink, or take any medicines or vitamins for at least 30 minutes. Stand or sit up for at least 30 minutes after you  take this medicine; do not lie down. Take this medicine on the same day every week. Do not take your medicine more often than directed. Talk to your pediatrician regarding the use of this medicine in children. Special care may be needed. Overdosage: If you think you have taken too much of this medicine contact a poison control center or emergency room at once. NOTE: This medicine is only for you. Do not share this medicine with others.  What if I miss a dose? If you miss a dose, take the dose on the morning after you remember. Then take your next dose on your regular day of the week. Never take 2 tablets on the same day. Do not take double or extra doses.  What may interact with this medicine? -aluminum hydroxide -antacids -aspirin -calcium supplements -drugs for inflammation like ibuprofen, naproxen, and others -iron supplements -magnesium supplements -vitamins with minerals This list may not describe all possible interactions. Give your health care provider a list of all the medicines, herbs,  non-prescription drugs, or dietary supplements you use. Also tell them if you smoke, drink alcohol, or use illegal drugs. Some items may interact with your medicine.  What should I watch for while using this medicine? Visit your doctor or health care professional for regular checks ups. It may be some time before you see benefit from this medicine. Do not stop taking your medication except on your doctor's advice. Your doctor or health care professional may order blood tests and other tests to see how you are doing. You should make sure you get enough calcium and vitamin D while you are taking this medicine, unless your doctor tells you not to. Discuss the foods you eat and the vitamins you take with your health care professional. Some people who take this medicine have severe bone, joint, and/or muscle pain. This medicine may also increase your risk for a broken thigh bone. Tell your doctor right away if you have pain in your upper leg or groin. Tell your doctor if you have any pain that does not go away or that gets worse. This medicine can make you more sensitive to the sun. If you get a rash while taking this medicine, sunlight may cause the rash to get worse. Keep out of the sun. If you cannot avoid being in the sun, wear protective clothing and use sunscreen. Do not use sun lamps or tanning beds/booths.  What side effects may I notice from receiving this medicine? Side effects that you should report to your doctor or health care professional as soon as possible: -allergic reactions like skin rash, itching or hives, swelling of the face, lips, or tongue -black or tarry stools -bone, muscle or joint pain -changes in vision -chest pain -heartburn or stomach pain -jaw pain, especially after dental work -pain or trouble when swallowing -redness, blistering, peeling or loosening of the skin, including inside the mouth Side effects that usually do not require medical attention (report to your  doctor or health care professional if they continue or are bothersome): -changes in taste -diarrhea or constipation -eye pain or itching -headache -nausea or vomiting -stomach gas or fullness This list may not describe all possible side effects. Call your doctor for medical advice about side effects. You may report side effects to FDA at 1-800-FDA-1088.  Where should I keep my medicine? Keep out of the reach of children. Store at room temperature of 15 and 30 degrees C (59 and 86 degrees F). Throw away any unused  medicine after the expiration date. NOTE: This sheet is a summary. It may not cover all possible information. If you have questions about this medicine, talk to your doctor, pharmacist, or health care provider.  2013, [employer] Standard. (10/26/2011 9:02:42 AM)

## 2013-10-28 NOTE — Progress Notes (Signed)
Patient at [facility] for routine post treatment follow up.  Patient reports that she is doing very well.  She continues to take Arimidex with minimal side effects.  She does report that it causes her to have headaches so she takes it at bedtime and tolerates it well.  She continues to be active, however, since her last visit, her hip pain has continued and has been evaluated by an Orthopedic Surgeon.  She is currently using a cane to assist with ambulation and is scheduled for a total hip arthroplasty on November 25th due to arthritic changes in her hip.  Patient is anxious to complete surgery so that her pain can be relieved and she can return to a more active lifestyle.  Patient denies any questions or concerns at this time.  I confirmed that she has my contact information and encouraged her to call me for any needs.

## 2013-10-28 NOTE — Progress Notes (Addendum)
[facility]  Telephone:(336) [phone] Fax:(336) [phone]  OFFICE PROGRESS NOTE  ID: Alexandra Snyder   DOB: [birth year]  MR#: [id number]  JYN#:[id number]   PCP: [name], MD SU:   Emelia Loron, MD  RAD ONC:   Dorothy Puffer, MD   DIAGNOSIS: Alexandra Snyder is a  80 y.o. female diagnosed with invasive ductal carcinoma of the left breast in April 2014.   STAGE:  Cancer of upper-outer quadrant of female breast  Primary site: Breast (Left)  Staging method: AJCC 7th Edition  Clinical: Stage IA (T1b, N0, cM0)  Summary: Stage IA (T1b, N0, cM0)   PRIOR THERAPY: #1   On 03/28/2013  screening mammogram showed architectural distortion in the left breast. No changes in the right. Left breast ultrasound on 04/04/2013 showed a hypoechoic mass at the 12:30 o'clock position, 6 cm from the nipple measuring 1.2 cm.  Ultrasound-guided biopsy of the left breast on 04/08/2013 had pathology that revealed invasive ductal carcinoma, grade 2, estrogen receptor 100% positive, progesterone receptor negative,  HER-2/neu negative, with Ki-67 10%  #2 Status post left breast lumpectomy on 04/21/2013 with the final pathology revealing a stage I, pT1b, pNX,  0.8 cm invasive ductal carcinoma, grade 1, and ductal carcinoma in situ, low-grade, margins were negative for carcinoma, estrogen receptor positive, progesterone receptor negative, HER-2/neu negative,  with Ki-67 of 10%.  #3 Adjuvant radiation therapy from 05/27/2013 through 06/23/2013.  #4 Antiestrogen therapy with Arimidex 1 mg by mouth daily with plan therapy for a total of 5 years began on 07/08/2013.   CURRENT THERAPY: Arimidex 1 mg by mouth daily   INTERVAL HISTORY: Alexandra Snyder is a 79 y.o. female who returns today for followup of invasive ductal carcinoma of the left breast.  She was also seen by [employer] research nurse Beth during today's office visit.  Her interval history is significant for ongoing chronic left hip pain with left  hip replacement scheduled for 11/18/2013 with Dr. Charlann Boxer, Orthopedist.  The patient also mentions fleeting depression at times, but states talking with her pastor and keeping busy helps her.  She declines speaking with Psychologist Dr. Enzo Bi at this time, and also declines taking Venlafaxine.  Alexandra Snyder is tolerating Arimidex well but states it did cause her headaches initially, but this has since resolved since she started taking Arimidex at night.  The patient also states she has ongoing vaginal dryness and we discussed several over-the-counter remedies.  Her interval history is otherwise stable.   MEDICAL HISTORY: Past Medical History  Diagnosis Date  . Breast cancer     left  . Arthritis   . Asthma   . COPD (chronic obstructive pulmonary disease)   . GERD (gastroesophageal reflux disease)   . History of radiation therapy 05/27/2013-06/23/2013    50 Gy to left breast    ALLERGIES:   Allergies  Allergen Reactions  . Celebrex [Celecoxib] Itching    Hands itch     MEDICATIONS:  Current Outpatient Prescriptions  Medication Sig Dispense Refill  . acetaminophen (TYLENOL) 325 MG tablet Take 650 mg by mouth every 6 (six) hours as needed for pain or fever.      Marland Kitchen albuterol (PROVENTIL HFA;VENTOLIN HFA) 108 (90 BASE) MCG/ACT inhaler Inhale 2 puffs into the lungs every 6 (six) hours as needed for wheezing.      Marland Kitchen anastrozole (ARIMIDEX) 1 MG tablet Take 1 tablet (1 mg total) by mouth daily.  90 tablet  10  . ibuprofen (ADVIL,MOTRIN) 200 MG  tablet Take 200 mg by mouth every 6 (six) hours as needed for pain, fever or headache.      . naproxen (NAPROSYN) 500 MG tablet Take 1 tablet (500 mg total) by mouth 2 (two) times daily with a meal.  30 tablet  0  . rosuvastatin (CRESTOR) 20 MG tablet Take 20 mg by mouth daily.      . cholecalciferol (VITAMIN D) 1000 UNITS tablet Take 1,000 Units by mouth 2 (two) times daily.       No current facility-administered medications for this visit.     SURGICAL HISTORY:  Past Surgical History  Procedure Laterality Date  . Eye surgery Bilateral     Cataract removal  . Colonoscopy w/ polypectomy    . Tonsillectomy    . Breast lumpectomy with needle localization Left 04/21/2013    Procedure: LEFT BREAST WIRE GUIDED  LUMPECTOMY ;  Surgeon: Emelia Loron, MD;  Location: [facility] OR;  Service: General;  Laterality: Left;    REVIEW OF SYSTEMS:  Pertinent items are noted in HPI.   A 10 point review of systems was completed and is negative except as noted above.  Alexandra Snyder denies any other symptomatology including fatigue, fever or chills, headache, vision changes, swollen glands, cough or shortness of breath, chest pain or discomfort, nausea, vomiting, diarrhea, constipation, change in urinary or bowel habits, any other arthralgias/myalgias, unusual bleeding/bruising or any other symptomatology.   PHYSICAL EXAMINATION: Blood pressure 182/84, pulse 97, temperature 98.3 F (36.8 C), temperature source Oral, resp. rate 18, height 5' (1.524 m), weight 136 lb 11.2 oz (62.007 kg). Body mass index is 26.7 kg/(m^2).  ECOG PERFORMANCE STATUS: 1 - Symptomatic but completely ambulatory  General appearance: Alert, cooperative, well nourished, no apparent distress, ambulating with a cane Head: Normocephalic, without obvious abnormality, atraumatic, dentures Eyes: Arcus senilis, PERRLA, EOMI Nose: Nares, septum and mucosa are normal, no drainage or sinus tenderness Neck: No adenopathy, supple, symmetrical, trachea midline, no tenderness Resp: Clear to auscultation bilaterally, no wheezes/rales/rhonchi Cardio: Regular rate and rhythm, S1, S2 normal, no murmur, click, rub or gallop, no edema Breasts:  Left breast has well-healed surgical scar, left breast architectural and radiation changes noted in, thick and left breast areola, glandular breast tissue bilaterally, firm inframammary and medial mammary ridge, bilateral axillary fullness,  no nipple  inversion GI: Soft, distended, non-tender, hypoactive bowel sounds, no organomegaly Skin: No rashes/lesions, skin warm and dry, bilateral upper extremity ecchymosis in various stages of healing, no cyanosis  M/S:  Atraumatic, limited strength and range of motion in left lower extremity, all of their extremities are normal, no clubbing, visible osteoporosis  Lymph nodes: Cervical, supraclavicular, and axillary nodes normal Neurologic: Grossly normal, cranial nerves II through XII intact, alert and oriented x 3 Psych: Appropriate affect    LABORATORY DATA: Lab Results  Component Value Date   WBC 7.6 10/28/2013   HGB 11.9 10/28/2013   HCT 36.4 10/28/2013   MCV 88.6 10/28/2013   PLT 223 10/28/2013      Chemistry      Component Value Date/Time   NA 142 10/28/2013 0946   NA 142 04/18/2013 0853   K 4.2 10/28/2013 0946   K 4.0 04/18/2013 0853   CL 103 05/06/2013 0923   CL 104 04/18/2013 0853   CO2 24 10/28/2013 0946   CO2 29 04/18/2013 0853   BUN 19.3 10/28/2013 0946   BUN 16 04/18/2013 0853   CREATININE 0.8 10/28/2013 0946   CREATININE 0.83 04/18/2013 0853  Component Value Date/Time   CALCIUM 10.4 10/28/2013 0946   CALCIUM 9.9 04/18/2013 0853   ALKPHOS 71 10/28/2013 0946   AST 16 10/28/2013 0946   ALT 11 10/28/2013 0946   BILITOT 0.36 10/28/2013 0946      RADIOGRAPHIC STUDIES: Bone density scan on 07/11/2013 showed a T score of -3.8 (osteoporosis).    ASSESSMENT: Alexandra Snyder is a 79 y.o. female with:  #1 Status post left breast lumpectomy on 04/21/2013 with the final pathology revealing a stage I, pT1b, pNX,  0.8 cm invasive ductal carcinoma, grade 1, and ductal carcinoma in situ, low-grade, margins were negative for carcinoma, estrogen receptor positive, progesterone receptor negative, HER-2/neu negative,  with Ki-67 of 10%.  #2  Adjuvant radiation therapy from 05/27/2013 through 06/23/2013.  #3 Antiestrogen therapy with Arimidex 1 mg by mouth daily began on 07/08/2013 with planned  therapy for a total of 5 years.  #4 Osteoporosis  #5  Vaginal dryness - various over-the-counter remedies for vaginal dryness were discussed with the patient.   PLAN:  #1 Alexandra Snyder will continue antiestrogen therapy with Arimidex 1 mg by mouth daily.  She states she does not need a refill of Arimidex at this time.  We will order her next bilateral digital diagnostic mammogram for her due in 03/2014.  #2 In light of the upcoming left hip orthopedic surgery scheduled for 11/18/2013, we will delay starting Fosamax 70 mg by mouth Q7 days for osteoporosis.  Fosamax will be started during her next office visit in 6 months.  In the meantime, Alexandra Snyder was encouraged to take calcium with vitamin D twice daily.  Once she recovers from surgery, weight bearing exercise with walking as tolerated is encouraged.  Literature about Fosamax was provided to the patient.  #3 We plan to see Alexandra Snyder again in 6 months at which time we will check a CBC, CMP, and vitamin D level.  All questions answered.  Alexandra Snyder encouraged to contact us in the interim with any questions, concerns, or problems.  Drue Second, MD Medical/Oncology [facility] [phone] (beeper) [phone] (Office)  10/31/2013, 1:18 PM

## 2013-11-07 ENCOUNTER — Encounter (HOSPITAL_COMMUNITY): Payer: Self-pay | Admitting: Pharmacy Technician

## 2013-11-11 ENCOUNTER — Encounter (HOSPITAL_COMMUNITY)
Admission: RE | Admit: 2013-11-11 | Discharge: 2013-11-11 | Disposition: A | Discharge status: Home / Self Care | Payer: Medicare Other | Source: Ambulatory Visit | Attending: Orthopedic Surgery | Admitting: Orthopedic Surgery

## 2013-11-11 ENCOUNTER — Encounter (HOSPITAL_COMMUNITY): Payer: Self-pay

## 2013-11-11 DIAGNOSIS — Z01818 Encounter for other preprocedural examination: Secondary | ICD-10-CM | POA: Insufficient documentation

## 2013-11-11 DIAGNOSIS — Z01812 Encounter for preprocedural laboratory examination: Secondary | ICD-10-CM | POA: Insufficient documentation

## 2013-11-11 HISTORY — DX: Pure hypercholesterolemia, unspecified: E78.00

## 2013-11-11 HISTORY — DX: Shortness of breath: R06.02

## 2013-11-11 LAB — PROTIME-INR
INR: 1 (ref 0.00–1.49)
Prothrombin Time: 13 seconds (ref 11.6–15.2)

## 2013-11-11 LAB — URINE MICROSCOPIC-ADD ON

## 2013-11-11 LAB — CBC
HCT: 35.3 % — ABNORMAL LOW (ref 36.0–46.0)
Hemoglobin: 11.9 g/dL — ABNORMAL LOW (ref 12.0–15.0)
MCH: 29.5 pg (ref 26.0–34.0)
RDW: 15.1 % (ref 11.5–15.5)
WBC: 8.2 10*3/uL (ref 4.0–10.5)

## 2013-11-11 LAB — URINALYSIS, ROUTINE W REFLEX MICROSCOPIC
Glucose, UA: NEGATIVE mg/dL
Ketones, ur: NEGATIVE mg/dL
Nitrite: NEGATIVE
Specific Gravity, Urine: 1.028 (ref 1.005–1.030)
Urobilinogen, UA: 1 mg/dL (ref 0.0–1.0)
pH: 6 (ref 5.0–8.0)

## 2013-11-11 LAB — BASIC METABOLIC PANEL
CO2: 27 mEq/L (ref 19–32)
Chloride: 103 mEq/L (ref 96–112)
GFR calc Af Amer: >90 mL/min (ref >90)
GFR calc non Af Amer: 79 mL/min — ABNORMAL LOW (ref >90)
Potassium: 4.2 mEq/L (ref 3.5–5.1)

## 2013-11-11 LAB — SURGICAL PCR SCREEN
MRSA, PCR: NEGATIVE
Staphylococcus aureus: NEGATIVE

## 2013-11-11 LAB — APTT: aPTT: 30 seconds (ref 24–37)

## 2013-11-11 LAB — ABO/RH: ABO/RH(D): O POS

## 2013-11-11 NOTE — Patient Instructions (Signed)
20 SHADA NIENABER  11/11/2013   Your procedure is scheduled on: 11/18/13  Report to [facility] at 1235 PM.  Call this number if you have problems the morning of surgery [phone]: [phone]   Remember: please bring inhaler on day of surgery    Do not eat food After Midnight, clear liquids from midnight until 9:30 am on 11/18/13 then nothing.      Take these medicines the morning of surgery with A SIP OF WATER: albuterol, arimidex    Do not wear jewelry, make-up or nail polish.  Do not wear lotions, powders, or perfumes. You may wear deodorant.  Do not shave 48 hours prior to surgery. Men may shave face and neck.  Do not bring valuables to the hospital.  Contacts, dentures or bridgework may not be worn into surgery.  Leave suitcase in the car. After surgery it may be brought to your room.  For patients admitted to the hospital, checkout time is 11:00 AM the day of discharge.    Please read over the following fact sheets that you were given: MRSA Information, incentive spirometry fact sheet, blood fact sheet.  Birdie Sons, RN  pre op nurse call if needed [phone]    FAILURE TO FOLLOW THESE INSTRUCTIONS MAY RESULT IN CANCELLATION OF YOUR SURGERY   Patient Signature: ___________________________________________

## 2013-11-11 NOTE — Progress Notes (Signed)
Chest x-ray 04/18/13 on EPIC, EKG 04/23/13 on EPIC

## 2013-11-16 NOTE — H&P (Signed)
TOTAL HIP ADMISSION H&P  Patient is admitted for left total hip arthroplasty, anterior approach.  Subjective:  Chief Complaint:     Left hip OA / pain  HPI: Alexandra Snyder, 79 y.o. female, has a history of pain and functional disability in the left hip(s) due to arthritis and patient has failed non-surgical conservative treatments for greater than 12 weeks to include NSAID's and/or analgesics, use of assistive devices and activity modification.  Onset of symptoms was gradual starting 1.5+ years ago with gradually worsening course since that time.The patient noted no past surgery on the left hip(s).  Patient currently rates pain in the left hip at 10 out of 10 with activity. Patient has night pain, worsening of pain with activity and weight bearing, trendelenberg gait, pain that interfers with activities of daily living and pain with passive range of motion. Patient has evidence of periarticular osteophytes and joint space narrowing by imaging studies. This condition presents safety issues increasing the risk of falls. There is no current active infection.  Risks, benefits and expectations were discussed with the patient.  Risks including but not limited to the risk of anesthesia, blood clots, nerve damage, blood vessel damage, failure of the prosthesis, infection and up to and including death.  Patient understand the risks, benefits and expectations and wishes to proceed with surgery.   D/C Plans:   Home with HHPT  Post-op Meds:    No Rx given  Tranexamic Acid:   Not to be given  Decadron:    To be given  FYI:    Xarelto / ASA - breast CA  Norco post-op   Patient Active Problem List   Diagnosis Date Noted  . COPD (chronic obstructive pulmonary disease) 04/16/2013  . Cancer of upper-outer quadrant of female breast 04/10/2013   Past Medical History  Diagnosis Date  . Breast cancer     left  . Asthma   . COPD (chronic obstructive pulmonary disease)   . GERD (gastroesophageal reflux  disease)   . History of radiation therapy 05/27/2013-06/23/2013    50 Gy to left breast  . Hypercholesteremia   . Shortness of breath   . Arthritis     hips    Past Surgical History  Procedure Laterality Date  . Colonoscopy w/ polypectomy  2004  . Breast lumpectomy with needle localization Left 04/21/2013    Procedure: LEFT BREAST WIRE GUIDED  LUMPECTOMY ;  Surgeon: Emelia Loron, MD;  Location: [facility] OR;  Service: General;  Laterality: Left;  . Tonsillectomy      as child  . Eye surgery Bilateral 2010    Cataract removal    No prescriptions prior to admission   Allergies  Allergen Reactions  . Celebrex [Celecoxib] Itching    Hands itch    History  Substance Use Topics  . Smoking status: Former Smoker -- 1.00 packs/day for 41 years    Types: Cigarettes    Quit date: 05/08/2003  . Smokeless tobacco: Never Used  . Alcohol Use: No    Family History  Problem Relation Age of Onset  . Ovarian cancer Sister   . Hypertension Mother   . Heart Problems Brother     Pacemaker     Review of Systems  Constitutional: Negative.   HENT: Negative.   Eyes: Negative.   Respiratory: Negative.   Cardiovascular: Negative.   Gastrointestinal: Positive for heartburn and nausea.  Genitourinary: Negative.   Musculoskeletal: Positive for back pain and joint pain.  Skin: Negative.  Neurological: Positive for tremors.  Endo/Heme/Allergies: Negative.   Psychiatric/Behavioral: Negative.     Objective:  Physical Exam  Constitutional: She is oriented to person, place, and time. She appears well-developed and well-nourished.  HENT:  Head: Normocephalic and atraumatic.  Mouth/Throat: Oropharynx is clear and moist. She has dentures.  Eyes: Pupils are equal, round, and reactive to light.  Neck: Neck supple. No JVD present. No tracheal deviation present. No thyromegaly present.  Cardiovascular: Normal rate, regular rhythm, normal heart sounds and intact distal pulses.   Respiratory:  Effort normal and breath sounds normal. No respiratory distress. She has no wheezes.  GI: Soft. There is no tenderness. There is no guarding.  Musculoskeletal:       Left hip: She exhibits decreased range of motion, decreased strength, tenderness and bony tenderness. She exhibits no swelling, no deformity and no laceration.  Lymphadenopathy:    She has no cervical adenopathy.  Neurological: She is alert and oriented to person, place, and time.  Skin: Skin is warm and dry.  Psychiatric: She has a normal mood and affect.     Labs:  Estimated body mass index is 26.85 kg/(m^2) as calculated from the following:   Height as of 07/23/13: 5' (1.524 m).   Weight as of 07/23/13: 62.37 kg (137 lb 8 oz).   Imaging Review Plain radiographs demonstrate severe degenerative joint disease of the left hip(s). The bone quality appears to be good for age and reported activity level.  Assessment/Plan:  End stage arthritis, left hip(s)  The patient history, physical examination, clinical judgement of the provider and imaging studies are consistent with end stage degenerative joint disease of the left hip(s) and total hip arthroplasty is deemed medically necessary. The treatment options including medical management, injection therapy, arthroscopy and arthroplasty were discussed at length. The risks and benefits of total hip arthroplasty were presented and reviewed. The risks due to aseptic loosening, infection, stiffness, dislocation/subluxation,  thromboembolic complications and other imponderables were discussed.  The patient acknowledged the explanation, agreed to proceed with the plan and consent was signed. Patient is being admitted for inpatient treatment for surgery, pain control, PT, OT, prophylactic antibiotics, VTE prophylaxis, progressive ambulation and ADL's and discharge planning.The patient is planning to be discharged home with home health services.     Anastasio Auerbach Caleigh Rabelo   PAC  11/16/2013,  6:20 PM

## 2013-11-18 ENCOUNTER — Inpatient Hospital Stay (HOSPITAL_COMMUNITY)
Admission: RE | Admit: 2013-11-18 | Discharge: 2013-11-20 | DRG: 470 | Disposition: A | Discharge status: Home Health | Payer: Medicare Other | Source: Ambulatory Visit | Attending: Orthopedic Surgery | Admitting: Orthopedic Surgery

## 2013-11-18 ENCOUNTER — Encounter (HOSPITAL_COMMUNITY): Payer: Medicare Other | Admitting: Anesthesiology

## 2013-11-18 ENCOUNTER — Encounter (HOSPITAL_COMMUNITY): Payer: Self-pay | Admitting: *Deleted

## 2013-11-18 ENCOUNTER — Inpatient Hospital Stay (HOSPITAL_COMMUNITY): Payer: Medicare Other

## 2013-11-18 ENCOUNTER — Encounter (HOSPITAL_COMMUNITY)
Admission: RE | Disposition: A | Discharge status: Home / Self Care | Payer: Self-pay | Source: Ambulatory Visit | Attending: Orthopedic Surgery

## 2013-11-18 ENCOUNTER — Inpatient Hospital Stay (HOSPITAL_COMMUNITY): Payer: Medicare Other | Admitting: Anesthesiology

## 2013-11-18 DIAGNOSIS — Z6825 Body mass index (BMI) 25.0-25.9, adult: Secondary | ICD-10-CM

## 2013-11-18 DIAGNOSIS — K219 Gastro-esophageal reflux disease without esophagitis: Secondary | ICD-10-CM | POA: Diagnosis present

## 2013-11-18 DIAGNOSIS — D62 Acute posthemorrhagic anemia: Secondary | ICD-10-CM | POA: Diagnosis not present

## 2013-11-18 DIAGNOSIS — Z923 Personal history of irradiation: Secondary | ICD-10-CM

## 2013-11-18 DIAGNOSIS — Z96649 Presence of unspecified artificial hip joint: Secondary | ICD-10-CM

## 2013-11-18 DIAGNOSIS — R110 Nausea: Secondary | ICD-10-CM | POA: Diagnosis present

## 2013-11-18 DIAGNOSIS — Z87891 Personal history of nicotine dependence: Secondary | ICD-10-CM

## 2013-11-18 DIAGNOSIS — C50919 Malignant neoplasm of unspecified site of unspecified female breast: Secondary | ICD-10-CM | POA: Diagnosis present

## 2013-11-18 DIAGNOSIS — M1610 Unilateral primary osteoarthritis, unspecified hip: Principal | ICD-10-CM | POA: Diagnosis present

## 2013-11-18 DIAGNOSIS — E663 Overweight: Secondary | ICD-10-CM

## 2013-11-18 DIAGNOSIS — R197 Diarrhea, unspecified: Secondary | ICD-10-CM | POA: Diagnosis present

## 2013-11-18 DIAGNOSIS — D500 Iron deficiency anemia secondary to blood loss (chronic): Secondary | ICD-10-CM | POA: Diagnosis not present

## 2013-11-18 DIAGNOSIS — Z8249 Family history of ischemic heart disease and other diseases of the circulatory system: Secondary | ICD-10-CM

## 2013-11-18 DIAGNOSIS — Z01812 Encounter for preprocedural laboratory examination: Secondary | ICD-10-CM

## 2013-11-18 DIAGNOSIS — Z8041 Family history of malignant neoplasm of ovary: Secondary | ICD-10-CM

## 2013-11-18 DIAGNOSIS — M169 Osteoarthritis of hip, unspecified: Principal | ICD-10-CM | POA: Diagnosis present

## 2013-11-18 HISTORY — PX: TOTAL HIP ARTHROPLASTY: SHX124

## 2013-11-18 LAB — TYPE AND SCREEN

## 2013-11-18 SURGERY — ARTHROPLASTY, HIP, TOTAL, ANTERIOR APPROACH
Anesthesia: Spinal | Site: Hip | Laterality: Left | Wound class: Clean

## 2013-11-18 MED ORDER — ONDANSETRON HCL 4 MG PO TABS: 4.0000 mg | ORAL_TABLET | Freq: Four times a day (QID) | ORAL | Status: DC | PRN

## 2013-11-18 MED ORDER — DOCUSATE SODIUM 100 MG PO CAPS
100.0000 mg | ORAL_CAPSULE | Freq: Two times a day (BID) | ORAL | Status: DC
  Administered 2013-11-18: 100 mg via ORAL

## 2013-11-18 MED ORDER — PROPOFOL 10 MG/ML IV BOLUS
INTRAVENOUS | Status: AC
  Filled 2013-11-18: qty 20

## 2013-11-18 MED ORDER — ATORVASTATIN CALCIUM 40 MG PO TABS
40.0000 mg | ORAL_TABLET | Freq: Every day | ORAL | Status: DC
  Filled 2013-11-18 (×2): qty 1

## 2013-11-18 MED ORDER — FENTANYL CITRATE 0.05 MG/ML IJ SOLN
INTRAMUSCULAR | Status: AC
  Filled 2013-11-18: qty 5

## 2013-11-18 MED ORDER — ANASTROZOLE 1 MG PO TABS
1.0000 mg | ORAL_TABLET | Freq: Every day | ORAL | Status: DC
  Administered 2013-11-19: 20:00:00 1 mg via ORAL
  Filled 2013-11-18 (×2): qty 1

## 2013-11-18 MED ORDER — DIPHENHYDRAMINE HCL 25 MG PO CAPS
25.0000 mg | ORAL_CAPSULE | Freq: Four times a day (QID) | ORAL | Status: DC | PRN
  Administered 2013-11-19: 18:00:00 25 mg via ORAL
  Filled 2013-11-18: qty 1

## 2013-11-18 MED ORDER — CEFAZOLIN SODIUM-DEXTROSE 2-3 GM-% IV SOLR
2.0000 g | Freq: Four times a day (QID) | INTRAVENOUS | Status: AC
  Administered 2013-11-18 – 2013-11-19 (×2): 2 g via INTRAVENOUS
  Filled 2013-11-18 (×2): qty 50

## 2013-11-18 MED ORDER — FENTANYL CITRATE 0.05 MG/ML IJ SOLN: 25.0000 ug | INTRAMUSCULAR | Status: DC | PRN

## 2013-11-18 MED ORDER — PROPOFOL 10 MG/ML IV BOLUS
INTRAVENOUS | Status: DC | PRN
  Administered 2013-11-18: 20 mg via INTRAVENOUS
  Administered 2013-11-18: 30 mg via INTRAVENOUS

## 2013-11-18 MED ORDER — SODIUM CHLORIDE 0.9 % IV SOLN
10.0000 mg | INTRAVENOUS | Status: DC | PRN
  Administered 2013-11-18: 50 ug/min via INTRAVENOUS

## 2013-11-18 MED ORDER — CEFAZOLIN SODIUM-DEXTROSE 2-3 GM-% IV SOLR
INTRAVENOUS | Status: AC
  Filled 2013-11-18: qty 50

## 2013-11-18 MED ORDER — BUPIVACAINE IN DEXTROSE 0.75-8.25 % IT SOLN
INTRATHECAL | Status: DC | PRN
  Administered 2013-11-18: 2 mL via INTRATHECAL

## 2013-11-18 MED ORDER — EPHEDRINE SULFATE 50 MG/ML IJ SOLN
INTRAMUSCULAR | Status: DC | PRN
  Administered 2013-11-18 (×3): 10 mg via INTRAVENOUS

## 2013-11-18 MED ORDER — MEPERIDINE HCL 50 MG/ML IJ SOLN: 6.2500 mg | INTRAMUSCULAR | Status: DC | PRN

## 2013-11-18 MED ORDER — PROPOFOL INFUSION 10 MG/ML OPTIME
INTRAVENOUS | Status: DC | PRN
  Administered 2013-11-18: 140 ug/kg/min via INTRAVENOUS

## 2013-11-18 MED ORDER — PHENYLEPHRINE HCL 10 MG/ML IJ SOLN
INTRAMUSCULAR | Status: AC
  Filled 2013-11-18: qty 1

## 2013-11-18 MED ORDER — METHOCARBAMOL 500 MG PO TABS: 500.0000 mg | ORAL_TABLET | Freq: Four times a day (QID) | ORAL | Status: DC | PRN

## 2013-11-18 MED ORDER — EPHEDRINE SULFATE 50 MG/ML IJ SOLN
INTRAMUSCULAR | Status: AC
  Filled 2013-11-18: qty 1

## 2013-11-18 MED ORDER — ALUM & MAG HYDROXIDE-SIMETH 200-200-20 MG/5ML PO SUSP: 30.0000 mL | ORAL | Status: DC | PRN

## 2013-11-18 MED ORDER — DEXAMETHASONE SODIUM PHOSPHATE 10 MG/ML IJ SOLN
10.0000 mg | Freq: Once | INTRAMUSCULAR | Status: AC
  Administered 2013-11-19: 10 mg via INTRAVENOUS
  Filled 2013-11-18: qty 1

## 2013-11-18 MED ORDER — ZOLPIDEM TARTRATE 5 MG PO TABS: 5.0000 mg | ORAL_TABLET | Freq: Every evening | ORAL | Status: DC | PRN

## 2013-11-18 MED ORDER — ALBUTEROL SULFATE HFA 108 (90 BASE) MCG/ACT IN AERS
2.0000 | INHALATION_SPRAY | RESPIRATORY_TRACT | Status: DC | PRN
Start: 2013-11-18 — End: 2013-11-20
  Filled 2013-11-18: qty 6.7

## 2013-11-18 MED ORDER — BISACODYL 10 MG RE SUPP: 10.0000 mg | Freq: Every day | RECTAL | Status: DC | PRN

## 2013-11-18 MED ORDER — CEFAZOLIN SODIUM-DEXTROSE 2-3 GM-% IV SOLR
2.0000 g | INTRAVENOUS | Status: AC
  Administered 2013-11-18: 2 g via INTRAVENOUS

## 2013-11-18 MED ORDER — LIDOCAINE HCL (CARDIAC) 20 MG/ML IV SOLN
INTRAVENOUS | Status: AC
  Filled 2013-11-18: qty 5

## 2013-11-18 MED ORDER — PROMETHAZINE HCL 25 MG/ML IJ SOLN: 6.2500 mg | INTRAMUSCULAR | Status: DC | PRN

## 2013-11-18 MED ORDER — OXYCODONE HCL 5 MG PO TABS
5.0000 mg | ORAL_TABLET | ORAL | Status: DC
  Administered 2013-11-18: 10 mg via ORAL
  Filled 2013-11-18: qty 2

## 2013-11-18 MED ORDER — LACTATED RINGERS IV SOLN
INTRAVENOUS | Status: DC | PRN
  Administered 2013-11-18 (×2): via INTRAVENOUS

## 2013-11-18 MED ORDER — FERROUS SULFATE 325 (65 FE) MG PO TABS
325.0000 mg | ORAL_TABLET | Freq: Three times a day (TID) | ORAL | Status: DC
  Administered 2013-11-18 – 2013-11-20 (×3): 325 mg via ORAL
  Filled 2013-11-18 (×8): qty 1

## 2013-11-18 MED ORDER — SODIUM CHLORIDE 0.9 % IV SOLN
100.0000 mL/h | INTRAVENOUS | Status: DC
  Administered 2013-11-18 – 2013-11-20 (×3): 100 mL/h via INTRAVENOUS
  Filled 2013-11-18 (×8): qty 1000

## 2013-11-18 MED ORDER — METHOCARBAMOL 100 MG/ML IJ SOLN
500.0000 mg | Freq: Four times a day (QID) | INTRAVENOUS | Status: DC | PRN
  Filled 2013-11-18: qty 5

## 2013-11-18 MED ORDER — SODIUM CHLORIDE 0.9 % IJ SOLN
INTRAMUSCULAR | Status: AC
  Filled 2013-11-18: qty 10

## 2013-11-18 MED ORDER — PHENOL 1.4 % MT LIQD: 1.0000 | OROMUCOSAL | Status: DC | PRN

## 2013-11-18 MED ORDER — HYDROMORPHONE HCL PF 1 MG/ML IJ SOLN
0.5000 mg | INTRAMUSCULAR | Status: DC | PRN
  Administered 2013-11-19: 1 mg via INTRAVENOUS

## 2013-11-18 MED ORDER — FLEET ENEMA 7-19 GM/118ML RE ENEM: 1.0000 | ENEMA | Freq: Once | RECTAL | Status: AC | PRN

## 2013-11-18 MED ORDER — FENTANYL CITRATE 0.05 MG/ML IJ SOLN
INTRAMUSCULAR | Status: DC | PRN
  Administered 2013-11-18 (×2): 50 ug via INTRAVENOUS

## 2013-11-18 MED ORDER — ROCURONIUM BROMIDE 100 MG/10ML IV SOLN
INTRAVENOUS | Status: AC
  Filled 2013-11-18: qty 1

## 2013-11-18 MED ORDER — LIDOCAINE HCL (CARDIAC) 20 MG/ML IV SOLN
INTRAVENOUS | Status: DC | PRN
  Administered 2013-11-18: 100 mg via INTRAVENOUS

## 2013-11-18 MED ORDER — RIVAROXABAN 10 MG PO TABS
10.0000 mg | ORAL_TABLET | ORAL | Status: DC
  Administered 2013-11-19 – 2013-11-20 (×2): 10 mg via ORAL
  Filled 2013-11-18 (×2): qty 1

## 2013-11-18 MED ORDER — POLYETHYLENE GLYCOL 3350 17 G PO PACK
17.0000 g | PACK | Freq: Two times a day (BID) | ORAL | Status: DC
  Administered 2013-11-18: 17 g via ORAL

## 2013-11-18 MED ORDER — METOCLOPRAMIDE HCL 10 MG PO TABS: 5.0000 mg | ORAL_TABLET | Freq: Three times a day (TID) | ORAL | Status: DC | PRN

## 2013-11-18 MED ORDER — SUCCINYLCHOLINE CHLORIDE 20 MG/ML IJ SOLN
INTRAMUSCULAR | Status: AC
  Filled 2013-11-18: qty 1

## 2013-11-18 MED ORDER — ONDANSETRON HCL 4 MG/2ML IJ SOLN
4.0000 mg | Freq: Four times a day (QID) | INTRAMUSCULAR | Status: DC | PRN
  Administered 2013-11-19 (×2): 4 mg via INTRAVENOUS
  Filled 2013-11-18 (×2): qty 2

## 2013-11-18 MED ORDER — DEXAMETHASONE SODIUM PHOSPHATE 10 MG/ML IJ SOLN
10.0000 mg | Freq: Once | INTRAMUSCULAR | Status: AC
  Administered 2013-11-18: 10 mg via INTRAVENOUS

## 2013-11-18 MED ORDER — METOCLOPRAMIDE HCL 5 MG/ML IJ SOLN
5.0000 mg | Freq: Three times a day (TID) | INTRAMUSCULAR | Status: DC | PRN
  Administered 2013-11-19: 11:00:00 10 mg via INTRAVENOUS
  Filled 2013-11-18: qty 2

## 2013-11-18 MED ORDER — MENTHOL 3 MG MT LOZG: 1.0000 | LOZENGE | OROMUCOSAL | Status: DC | PRN

## 2013-11-18 SURGICAL SUPPLY — 39 items
ADH SKN CLS APL DERMABOND .7 (GAUZE/BANDAGES/DRESSINGS) ×1
BAG SPEC THK2 15X12 ZIP CLS (MISCELLANEOUS)
BAG ZIPLOCK 12X15 (MISCELLANEOUS) IMPLANT
BLADE SAW SGTL 18X1.27X75 (BLADE) ×2 IMPLANT
CAPT HIP PF MOP ×2 IMPLANT
DERMABOND ADVANCED (GAUZE/BANDAGES/DRESSINGS) ×1
DERMABOND ADVANCED .7 DNX12 (GAUZE/BANDAGES/DRESSINGS) ×1 IMPLANT
DRAPE C-ARM 42X120 X-RAY (DRAPES) ×2 IMPLANT
DRAPE STERI IOBAN 125X83 (DRAPES) ×2 IMPLANT
DRAPE U-SHAPE 47X51 STRL (DRAPES) ×6 IMPLANT
DRSG AQUACEL AG ADV 3.5X10 (GAUZE/BANDAGES/DRESSINGS) ×2 IMPLANT
DRSG TEGADERM 4X4.75 (GAUZE/BANDAGES/DRESSINGS) IMPLANT
DURAPREP 26ML APPLICATOR (WOUND CARE) ×2 IMPLANT
ELECT BLADE TIP CTD 4 INCH (ELECTRODE) ×2 IMPLANT
ELECT REM PT RETURN 9FT ADLT (ELECTROSURGICAL) ×2
ELECTRODE REM PT RTRN 9FT ADLT (ELECTROSURGICAL) ×1 IMPLANT
EVACUATOR 1/8 PVC DRAIN (DRAIN) IMPLANT
FACESHIELD LNG OPTICON STERILE (SAFETY) ×8 IMPLANT
GAUZE SPONGE 2X2 8PLY STRL LF (GAUZE/BANDAGES/DRESSINGS) IMPLANT
GLOVE BIOGEL PI IND STRL 7.5 (GLOVE) ×1 IMPLANT
GLOVE BIOGEL PI IND STRL 8 (GLOVE) ×1 IMPLANT
GLOVE BIOGEL PI INDICATOR 7.5 (GLOVE) ×1
GLOVE BIOGEL PI INDICATOR 8 (GLOVE) ×1
GLOVE ECLIPSE 8.0 STRL XLNG CF (GLOVE) ×2 IMPLANT
GLOVE ORTHO TXT STRL SZ7.5 (GLOVE) ×4 IMPLANT
GOWN BRE IMP PREV XXLGXLNG (GOWN DISPOSABLE) ×2 IMPLANT
GOWN PREVENTION PLUS LG XLONG (DISPOSABLE) ×2 IMPLANT
KIT BASIN OR (CUSTOM PROCEDURE TRAY) ×2 IMPLANT
PACK TOTAL JOINT (CUSTOM PROCEDURE TRAY) ×2 IMPLANT
PADDING CAST COTTON 6X4 STRL (CAST SUPPLIES) ×2 IMPLANT
SPONGE GAUZE 2X2 STER 10/PKG (GAUZE/BANDAGES/DRESSINGS)
SUCTION FRAZIER 12FR DISP (SUCTIONS) IMPLANT
SUT MNCRL AB 4-0 PS2 18 (SUTURE) ×2 IMPLANT
SUT VIC AB 1 CT1 36 (SUTURE) ×6 IMPLANT
SUT VIC AB 2-0 CT1 27 (SUTURE) ×2
SUT VIC AB 2-0 CT1 TAPERPNT 27 (SUTURE) ×2 IMPLANT
SUT VLOC 180 0 24IN GS25 (SUTURE) ×2 IMPLANT
TOWEL OR 17X26 10 PK STRL BLUE (TOWEL DISPOSABLE) ×2 IMPLANT
TRAY FOLEY CATH 14FRSI W/METER (CATHETERS) ×2 IMPLANT

## 2013-11-18 NOTE — Interval H&P Note (Signed)
History and Physical Interval Note:  11/18/2013 1:48 PM  Alexandra Snyder  has presented today for surgery, with the diagnosis of Left Hip Osteoarthritis  The various methods of treatment have been discussed with the patient and family. After consideration of risks, benefits and other options for treatment, the patient has consented to  Procedure(s): LEFT TOTAL HIP ARTHROPLASTY ANTERIOR APPROACH (Left) as a surgical intervention .  The patient's history has been reviewed, patient examined, no change in status, stable for surgery.  I have reviewed the patient's chart and labs.  Questions were answered to the patient's satisfaction.     Shelda Pal

## 2013-11-18 NOTE — Transfer of Care (Signed)
Immediate Anesthesia Transfer of Care Note  Patient: Alexandra Snyder  Procedure(s) Performed: Procedure(s): LEFT TOTAL HIP ARTHROPLASTY ANTERIOR APPROACH (Left)  Patient Location: PACU  Anesthesia Type:Spinal  Level of Consciousness: awake, alert  and oriented  Airway & Oxygen Therapy: Patient Spontanous Breathing and Patient connected to nasal cannula oxygen  Post-op Assessment: Report given to PACU RN and Post -op Vital signs reviewed and stable  Post vital signs: Reviewed and stable  Complications: No apparent anesthesia complications

## 2013-11-18 NOTE — Anesthesia Preprocedure Evaluation (Addendum)
Anesthesia Evaluation  Patient identified by MRN, date of birth, ID band Patient awake    Reviewed: Allergy & Precautions, H&P , NPO status , Patient's Chart, lab work & pertinent test results  Airway Mallampati: I TM Distance: >3 FB Neck ROM: Full    Dental  (+) Upper Dentures   Pulmonary shortness of breath, asthma , COPDformer smoker,  breath sounds clear to auscultation        Cardiovascular hypertension, negative cardio ROS  Rhythm:Regular Rate:Normal     Neuro/Psych negative neurological ROS  negative psych ROS   GI/Hepatic Neg liver ROS, GERD-  Medicated,  Endo/Other  negative endocrine ROS  Renal/GU negative Renal ROS     Musculoskeletal  (+) Arthritis -, Osteoarthritis,    Abdominal   Peds  Hematology negative hematology ROS (+)   Anesthesia Other Findings   Reproductive/Obstetrics negative OB ROS                          Anesthesia Physical  Anesthesia Plan  ASA: III  Anesthesia Plan: Spinal   Post-op Pain Management:    Induction:   Airway Management Planned:   Additional Equipment:   Intra-op Plan:   Post-operative Plan:   Informed Consent: I have reviewed the patients History and Physical, chart, labs and discussed the procedure including the risks, benefits and alternatives for the proposed anesthesia with the patient or authorized representative who has indicated his/her understanding and acceptance.   Dental advisory given  Plan Discussed with: CRNA  Anesthesia Plan Comments:        Anesthesia Quick Evaluation

## 2013-11-18 NOTE — Op Note (Signed)
NAME:  Alexandra Snyder                ACCOUNT NO.: [id number]      MEDICAL RECORD NO.: [id number]      FACILITY:  [facility]      PHYSICIAN:  Durene Romans D  DATE OF BIRTH:  02/26/1934     DATE OF PROCEDURE:  11/18/2013                                 OPERATIVE REPORT         PREOPERATIVE DIAGNOSIS: Left  hip osteoarthritis.      POSTOPERATIVE DIAGNOSIS:  Left hip osteoarthritis.      PROCEDURE:  Left total hip replacement through an anterior approach   utilizing DePuy THR system, component size 50mm pinnacle cup, a size 32+4 neutral   Altrex liner, a size 7 Hi Tri Lock stem with a 32+5 Articuleze ball.      SURGEON:  Madlyn Frankel. Charlann Boxer, M.D.      ASSISTANT:  Lanney Gins, PA-C     ANESTHESIA:  Spinal.      SPECIMENS:  None.      COMPLICATIONS:  None.      BLOOD LOSS:  200 cc     DRAINS:  None.      INDICATION OF THE PROCEDURE:  Alexandra Snyder is a 79 y.o. female who had   presented to office for evaluation of left hip pain.  Radiographs revealed   progressive degenerative changes with bone-on-bone   articulation to the  hip joint.  The patient had painful limited range of   motion significantly affecting their overall quality of life.  The patient was failing to    respond to conservative measures, and at this point was ready   to proceed with more definitive measures.  The patient has noted progressive   degenerative changes in his hip, progressive problems and dysfunction   with regarding the hip prior to surgery.  Consent was obtained for   benefit of pain relief.  Specific risk of infection, DVT, component   failure, dislocation, need for revision surgery, as well discussion of   the anterior versus posterior approach were reviewed.  Consent was   obtained for benefit of anterior pain relief through an anterior   approach.      PROCEDURE IN DETAIL:  The patient was brought to operative theater.   Once adequate anesthesia, preoperative  antibiotics, 2gm Ancef administered.   The patient was positioned supine on the OSI Hanna table.  Once adequate   padding of boney process was carried out, we had predraped out the hip, and  used fluoroscopy to confirm orientation of the pelvis and position.      The left hip was then prepped and draped from proximal iliac crest to   mid thigh with shower curtain technique.      Time-out was performed identifying the patient, planned procedure, and   extremity.     An incision was then made 2 cm distal and lateral to the   anterior superior iliac spine extending over the orientation of the   tensor fascia lata muscle and sharp dissection was carried down to the   fascia of the muscle and protractor placed in the soft tissues.      The fascia was then incised.  The muscle belly was identified and swept   laterally and  retractor placed along the superior neck.  Following   cauterization of the circumflex vessels and removing some pericapsular   fat, a second cobra retractor was placed on the inferior neck.  A third   retractor was placed on the anterior acetabulum after elevating the   anterior rectus.  A L-capsulotomy was along the line of the   superior neck to the trochanteric fossa, then extended proximally and   distally.  Tag sutures were placed and the retractors were then placed   intracapsular.  We then identified the trochanteric fossa and   orientation of my neck cut, confirmed this radiographically   and then made a neck osteotomy with the femur on traction.  The femoral   head was removed without difficulty or complication.  Traction was let   off and retractors were placed posterior and anterior around the   acetabulum.      The labrum and foveal tissue were debrided.  I began reaming with a 45mm   reamer and reamed up to 49mm reamer with good bony bed preparation and a 50   cup was chosen.  The final 50mm Pinnacle cup was then impacted under fluoroscopy  to confirm the  depth of penetration and orientation with respect to   abduction.  A screw was placed followed by the hole eliminator.  The final   32+4 neutral Altrex liner was impacted with good visualized rim fit.  The cup was positioned anatomically within the acetabular portion of the pelvis.      At this point, the femur was rolled at 80 degrees.  Further capsule was   released off the inferior aspect of the femoral neck.  I then   released the superior capsule proximally.  The hook was placed laterally   along the femur and elevated manually and held in position with the bed   hook.  The leg was then extended and adducted with the leg rolled to 100   degrees of external rotation.  Once the proximal femur was fully   exposed, I used a box osteotome to set orientation.  I then began   broaching with the starting chili pepper broach and passed this by hand and then broached up to 7.  With the 7 broach in place I chose a high offset neck and did a trial reduction.  The offset was appropriate, leg lengths   appeared to be equal, confirmed radiographically.  She was also identified to have very osteoporotic bone - this will impact post operative weight bearing  Given these findings, I went ahead and dislocated the hip, repositioned all   retractors and positioned the right hip in the extended and abducted position.  The final 7 Hi Tri Lock stem was   chosen and it was impacted down to the level of neck cut.  Based on this   and the trial reduction, a 32+5 Articuleze ball was chosen and   impacted onto a clean and dry trunnion, and the hip was reduced.  The   hip had been irrigated throughout the case again at this point.  I did   reapproximate the superior capsular leaflet to the anterior leaflet   using #1 Vicryl.  The fascia of the   tensor fascia lata muscle was then reapproximated using #1 Vicryl and 0 V-lock sutures.  The   remaining wound was closed with 2-0 Vicryl and running 4-0 Monocryl.   The hip  was cleaned, dried, and dressed sterilely using Dermabond and  Aquacel dressing.  She was then brought   to recovery room in stable condition tolerating the procedure well.    Lanney Gins, PA-C was present for the entirety of the case involved from   preoperative positioning, perioperative retractor management, general   facilitation of the case, as well as primary wound closure as assistant.            Madlyn Frankel Charlann Boxer, M.D.        11/18/2013 4:11 PM

## 2013-11-18 NOTE — Anesthesia Postprocedure Evaluation (Addendum)
Anesthesia Post Note  Patient: Alexandra Snyder  Procedure(s) Performed: Procedure(s) (LRB): LEFT TOTAL HIP ARTHROPLASTY ANTERIOR APPROACH (Left)  Anesthesia type: Spinal  Patient location: PACU  Post pain: Pain level controlled  Post assessment: Post-op Vital signs reviewed  Last Vitals: BP 161/85  Pulse 102  Temp(Src) 36.4 C (Oral)  Resp 18  Ht 5' (1.524 m)  Wt 136 lb (61.689 kg)  BMI 26.56 kg/m2  SpO2 100%  Post vital signs: Reviewed  Level of consciousness: sedated  Complications: No apparent anesthesia complications

## 2013-11-18 NOTE — Plan of Care (Signed)
Problem: Consults Goal: Diagnosis- Total Joint Replacement Primary Total Hip     

## 2013-11-18 NOTE — Anesthesia Procedure Notes (Signed)
Spinal  Patient location during procedure: OR Start time: 11/18/2013 2:40 PM End time: 11/18/2013 2:47 PM Staffing Anesthesiologist: Lewie Loron R Performed by: anesthesiologist  Preanesthetic Checklist Completed: patient identified, site marked, surgical consent, pre-op evaluation, timeout performed, IV checked, risks and benefits discussed and monitors and equipment checked Spinal Block Patient position: sitting Prep: ChloraPrep Patient monitoring: heart rate, continuous pulse ox and blood pressure Approach: midline Location: L3-4 Injection technique: single-shot Needle Needle type: Quincke  Needle gauge: 22 G Needle length: 9 cm Assessment Sensory level: T8 Additional Notes Expiration date of kit checked and confirmed. Patient tolerated procedure well, without complications.

## 2013-11-18 NOTE — Preoperative (Signed)
Beta Blockers   Reason not to administer Beta Blockers:Not Applicable 

## 2013-11-19 ENCOUNTER — Encounter (HOSPITAL_COMMUNITY): Payer: Self-pay | Admitting: Orthopedic Surgery

## 2013-11-19 ENCOUNTER — Encounter: Payer: Self-pay | Admitting: *Deleted

## 2013-11-19 DIAGNOSIS — D500 Iron deficiency anemia secondary to blood loss (chronic): Secondary | ICD-10-CM | POA: Diagnosis not present

## 2013-11-19 DIAGNOSIS — E663 Overweight: Secondary | ICD-10-CM

## 2013-11-19 LAB — CBC
HCT: 28.4 % — ABNORMAL LOW (ref 36.0–46.0)
Hemoglobin: 9.6 g/dL — ABNORMAL LOW (ref 12.0–15.0)
MCHC: 33.8 g/dL (ref 30.0–36.0)
Platelets: 185 10*3/uL (ref 150–400)
RDW: 14.6 % (ref 11.5–15.5)
WBC: 10.2 10*3/uL (ref 4.0–10.5)

## 2013-11-19 LAB — BASIC METABOLIC PANEL
Chloride: 101 mEq/L (ref 96–112)
GFR calc Af Amer: >90 mL/min (ref >90)
GFR calc non Af Amer: 80 mL/min — ABNORMAL LOW (ref >90)
Potassium: 3.7 mEq/L (ref 3.5–5.1)
Sodium: 135 mEq/L (ref 135–145)

## 2013-11-19 MED ORDER — HYDROCODONE-ACETAMINOPHEN 5-325 MG PO TABS
1.0000 | ORAL_TABLET | ORAL | Status: DC | PRN
Start: 2013-11-19 — End: 2013-11-20
  Administered 2013-11-19: 2 via ORAL
  Administered 2013-11-20: 1 via ORAL
  Filled 2013-11-19: qty 2
  Filled 2013-11-19: qty 1

## 2013-11-19 NOTE — Progress Notes (Addendum)
   Subjective: 1 Day Post-Op Procedure(s) (LRB): LEFT TOTAL HIP ARTHROPLASTY ANTERIOR APPROACH (Left)   Patient reports pain as mild, pain controlled. No events throughout the night.  Got to her room late, and didn't work with PT yesterday.  Discussed her PWB status, she understands.  Objective:   VITALS:   Filed Vitals:   11/19/13 0627  BP: 119/78  Pulse: 87  Temp: 98.2 F (36.8 C)  Resp: 18    Neurovascular intact Dorsiflexion/Plantar flexion intact Incision: dressing C/D/I No cellulitis present Compartment soft  LABS  Recent Labs  11/19/13 0407  HGB 9.6*  HCT 28.4*  WBC 10.2  PLT 185     Recent Labs  11/19/13 0407  NA 135  K 3.7  BUN 13  CREATININE 0.75  GLUCOSE 176*     Assessment/Plan: 1 Day Post-Op Procedure(s) (LRB): LEFT TOTAL HIP ARTHROPLASTY ANTERIOR APPROACH (Left) Foley cath d/c'ed Changed from oxycodone to Norco.  If itching told about Benadryl Advance diet Up with therapy D/C IV fluids Discharge home with home health eventually, possibly Thursday.  Expected ABLA  Treated with iron and will observe  Overweight (BMI 25-29.9) Estimated body mass index is 26.56 kg/(m^2) as calculated from the following:   Height as of this encounter: 5' (1.524 m).   Weight as of this encounter: 61.689 kg (136 lb). Patient also counseled that weight may inhibit the healing process Patient counseled that losing weight will help with future health issues       Anastasio Auerbach. Tecia Cinnamon   PAC  11/19/2013, 9:43 AM

## 2013-11-19 NOTE — Progress Notes (Signed)
UR COMPLETED  

## 2013-11-19 NOTE — Evaluation (Signed)
Physical Therapy Evaluation Patient Details Name: Alexandra Snyder MRN: [id number] DOB: 1934-08-31 Today's Date: 11/19/2013 Time: 0825-0912 PT Time Calculation (min): 47 min  PT Assessment / Plan / Recommendation History of Present Illness     Clinical Impression  Pt s/p L THR presents with decreased L LE strength/ROM, and post op pain and nausea limiting functional mobility.  Pt should progress to d/c home with family assist and HHPT follow up.    PT Assessment  Patient needs continued PT services    Follow Up Recommendations  Home health PT    Does the patient have the potential to tolerate intense rehabilitation      Barriers to Discharge        Equipment Recommendations  Rolling walker with 5" wheels (Pt is 5'1")    Recommendations for Other Services OT consult   Frequency 7X/week    Precautions / Restrictions Precautions Precautions: Fall Restrictions Weight Bearing Restrictions: Yes LLE Weight Bearing: Partial weight bearing LLE Partial Weight Bearing Percentage or Pounds: 50%   Pertinent Vitals/Pain 4/10; premed, ice pack provided      Mobility  Bed Mobility Bed Mobility: Supine to Sit Supine to Sit: 3: Mod assist Details for Bed Mobility Assistance: cues for sequence and use of R LE to self assist Transfers Transfers: Sit to Stand;Stand to Sit Sit to Stand: 3: Mod assist Stand to Sit: 3: Mod assist Details for Transfer Assistance: cues for LE management and use of UEs to self assist Ambulation/Gait Ambulation/Gait Assistance: 3: Mod assist Ambulation Distance (Feet): 6 Feet Assistive device: Rolling walker Ambulation/Gait Assistance Details: cues for posture, sequence, increased UE WB and position from RW Gait Pattern: Step-to pattern;Decreased step length - right;Decreased step length - left;Antalgic;Trunk flexed;Shuffle General Gait Details: ltd by nausea    Exercises Total Joint Exercises Ankle Circles/Pumps: AROM;10 reps;Supine;Both Quad Sets:  AROM;Both;10 reps;Supine Heel Slides: AAROM;15 reps;Supine;Left Hip ABduction/ADduction: AAROM;15 reps;Supine;Left   PT Diagnosis: Difficulty walking  PT Problem List: Decreased strength;Decreased range of motion;Decreased activity tolerance;Decreased mobility;Decreased knowledge of use of DME;Decreased knowledge of precautions;Pain PT Treatment Interventions: DME instruction;Gait training;Stair training;Functional mobility training;Therapeutic activities;Therapeutic exercise;Patient/family education     PT Goals(Current goals can be found in the care plan section) Acute Rehab PT Goals Patient Stated Goal: I have things to do before Christmas PT Goal Formulation: With patient Time For Goal Achievement: 11/27/13 Potential to Achieve Goals: Good  Visit Information  Last PT Received On: 11/19/13 Assistance Needed: +1       Prior Functioning  Home Living Family/patient expects to be discharged to:: Private residence Living Arrangements: Alone Available Help at Discharge: Family Type of Home: House Home Access: Stairs to enter Secretary/administrator of Steps: 1+1 Entrance Stairs-Rails: None Home Layout: One level Home Equipment: Medical laboratory scientific officer - single point Additional Comments: Sister in law will stay initially to assist Prior Function Level of Independence: Independent with assistive device(s) Communication Communication: No difficulties Dominant Hand: Right    Cognition  Cognition Arousal/Alertness: Awake/alert Behavior During Therapy: WFL for tasks assessed/performed Overall Cognitive Status: Within Functional Limits for tasks assessed    Extremity/Trunk Assessment Upper Extremity Assessment Upper Extremity Assessment: Overall WFL for tasks assessed Lower Extremity Assessment Lower Extremity Assessment: LLE deficits/detail LLE Deficits / Details: Hip strength 2/5 with AAROM at hip to 70 flex and 20 abd   Balance    End of Session PT - End of Session Equipment Utilized During  Treatment: Gait belt Activity Tolerance: Patient tolerated treatment well Patient left: in chair;with call bell/phone within  reach Nurse Communication: Mobility status  GP     Alexandra Snyder 11/19/2013, 10:37 AM

## 2013-11-19 NOTE — Evaluation (Addendum)
Occupational Therapy Evaluation Patient Details Name: Alexandra Snyder MRN: [id number] DOB: 01-02-1934 Today's Date: 11/19/2013 Time: 1030-1100 OT Time Calculation (min): 30 min  OT Assessment / Plan / Recommendation History of present illness LEFT TOTAL HIP ARTHROPLASTY ANTERIOR APPROACH (Left)    Clinical Impression   Pt limited by pain and nausea this visit.  Pt will benefit from continued OT to practice toilet and shower transfers next visit.    OT Assessment  Patient needs continued OT Services    Follow Up Recommendations  Home health OT;Supervision/Assistance - 24 hour    Barriers to Discharge      Equipment Recommendations  3 in 1 bedside comode    Recommendations for Other Services    Frequency  Min 2X/week    Precautions / Restrictions Precautions Precautions: Fall Restrictions Weight Bearing Restrictions: Yes LLE Weight Bearing: Partial weight bearing LLE Partial Weight Bearing Percentage or Pounds: 50%   Pertinent Vitals/Pain 9/10 L hip "sore" after activity Down to 7 with some rest after about 5 minutes; reposition, ice    ADL  Eating/Feeding: Simulated;Independent Where Assessed - Eating/Feeding: Chair Grooming: Simulated;Wash/dry hands;Set up Where Assessed - Grooming: Supported sitting Upper Body Bathing: Simulated;Chest;Right arm;Left arm;Abdomen;Supervision/safety;Set up Where Assessed - Upper Body Bathing: Unsupported sitting Lower Body Bathing: Simulated;Maximal assistance Where Assessed - Lower Body Bathing: Supported sit to stand Upper Body Dressing: Simulated;Minimal assistance Where Assessed - Upper Body Dressing: Unsupported sitting Lower Body Dressing: Simulated;Maximal assistance Where Assessed - Lower Body Dressing: Supported sit to stand Toilet Transfer: Simulated;Minimal assistance Toilet Transfer Method: Stand pivot Toileting - Clothing Manipulation and Hygiene: Simulated;Moderate assistance Where Assessed - Toileting Clothing  Manipulation and Hygiene: Standing Equipment Used: Rolling walker ADL Comments: Pt states she has a sock aid but doesnt really like it. She states she can use sock aid initially but she has family that will be there with her also. She also has the reacher. Educated her on shoe horn and long handle sponge. Pt limited by nausea this visit and only pivoted back to bed. Will need to practice 3in1 and shower transfer.     OT Diagnosis: Generalized weakness;Acute pain  OT Problem List: Decreased strength;Decreased knowledge of use of DME or AE;Pain OT Treatment Interventions: Self-care/ADL training;DME and/or AE instruction;Therapeutic activities;Patient/family education   OT Goals(Current goals can be found in the care plan section) Acute Rehab OT Goals Patient Stated Goal: decrease nausea OT Goal Formulation: With patient Time For Goal Achievement: 12/03/13 Potential to Achieve Goals: Good ADL Goals Pt Will Perform Grooming: with min guard assist;standing Pt Will Transfer to Toilet: with min guard assist;ambulating;bedside commode Pt Will Perform Toileting - Clothing Manipulation and hygiene: with min guard assist;sit to/from stand Pt Will Perform Tub/Shower Transfer: with min assist;Shower transfer;3 in 1 Additional ADL Goal #1: Pt will verbalize/demo AE options if desired for LB self care versus having assist  Visit Information  Last OT Received On: 11/19/13 Assistance Needed: +1 History of Present Illness: LEFT TOTAL HIP ARTHROPLASTY ANTERIOR APPROACH (Left)        Prior Functioning     Home Living Family/patient expects to be discharged to:: Private residence Living Arrangements: Alone Available Help at Discharge: Family Type of Home: House Home Access: Stairs to enter Secretary/administrator of Steps: 1+1 Entrance Stairs-Rails: None Home Layout: One level Home Equipment: Cane - single point;Shower seat - built Designer, fashion/clothing: Reacher;Sock  aid Additional Comments: Sister in law will stay initially to assist Prior Function Level of Independence: Independent with assistive device(s)  Communication Communication: No difficulties Dominant Hand: Right         Vision/Perception     Cognition  Cognition Arousal/Alertness: Awake/alert Behavior During Therapy: WFL for tasks assessed/performed Overall Cognitive Status: Within Functional Limits for tasks assessed    Extremity/Trunk Assessment Upper Extremity Assessment Upper Extremity Assessment: Overall WFL for tasks assessed Lower Extremity Assessment Lower Extremity Assessment: LLE deficits/detail LLE Deficits / Details: Hip strength 2/5 with AAROM at hip to 70 flex and 20 abd     Mobility Sit to supine: mod assist Assist for L LE onto bed Transfers Transfers: Sit to Stand;Stand to Sit Sit to Stand: 4: Min assist;With upper extremity assist;From chair/3-in-1 Stand to Sit: 4: Min assist;With upper extremity assist;To bed Details for Transfer Assistance: verbal cues for hand placement and LE management.         Balance     End of Session OT - End of Session Equipment Utilized During Treatment: Rolling walker;Gait belt Activity Tolerance: Patient limited by pain Patient left: in bed;with call bell/phone within reach  GO     Lennox Laity [phone] 11/19/2013, 11:55 AM

## 2013-11-19 NOTE — Progress Notes (Signed)
Physical Therapy Treatment Patient Details Name: Alexandra Snyder MRN: [id number] DOB: 1934/03/15 Today's Date: 11/19/2013 Time: 4098-1191 PT Time Calculation (min): 33 min  PT Assessment / Plan / Recommendation  History of Present Illness LEFT TOTAL HIP ARTHROPLASTY ANTERIOR APPROACH (Left)    PT Comments     Follow Up Recommendations  Home health PT     Does the patient have the potential to tolerate intense rehabilitation     Barriers to Discharge        Equipment Recommendations  Rolling walker with 5" wheels    Recommendations for Other Services OT consult  Frequency 7X/week   Progress towards PT Goals Progress towards PT goals: Progressing toward goals  Plan Current plan remains appropriate    Precautions / Restrictions Precautions Precautions: Fall Restrictions Weight Bearing Restrictions: Yes LLE Weight Bearing: Partial weight bearing LLE Partial Weight Bearing Percentage or Pounds: 50%   Pertinent Vitals/Pain 4/10; premed, ice pack provided    Mobility  Bed Mobility Bed Mobility: Supine to Sit;Sit to Supine Supine to Sit: 4: Min assist Sit to Supine: 3: Mod assist;4: Min assist Details for Bed Mobility Assistance: cues for sequence and use of R LE to self assist Transfers Transfers: Sit to Stand;Stand to Sit Sit to Stand: 4: Min assist;With upper extremity assist;From bed;From chair/3-in-1 Stand to Sit: 4: Min assist;With upper extremity assist;To bed;To chair/3-in-1 Details for Transfer Assistance: verbal cues for hand placement and LE management.  Ambulation/Gait Ambulation/Gait Assistance: 4: Min assist Ambulation Distance (Feet): 39 Feet (and 5) Assistive device: Rolling walker Ambulation/Gait Assistance Details: cues for posture, sequence, position from RW and increased UE WB Gait Pattern: Step-to pattern;Decreased step length - right;Decreased step length - left;Antalgic;Trunk flexed;Shuffle    Exercises Total Joint Exercises Ankle Circles/Pumps:  AROM;10 reps;Supine;Both Quad Sets: AROM;Both;10 reps;Supine Heel Slides: AAROM;15 reps;Supine;Left Hip ABduction/ADduction: AAROM;15 reps;Supine;Left   PT Diagnosis:    PT Problem List:   PT Treatment Interventions:     PT Goals (current goals can now be found in the care plan section) Acute Rehab PT Goals Patient Stated Goal: Home PT Goal Formulation: With patient Time For Goal Achievement: 11/27/13 Potential to Achieve Goals: Good  Visit Information  Last PT Received On: 11/19/13 Assistance Needed: +1 History of Present Illness: LEFT TOTAL HIP ARTHROPLASTY ANTERIOR APPROACH (Left)     Subjective Data  Subjective: I'm doing better than this morning Patient Stated Goal: Home   Cognition  Cognition Arousal/Alertness: Awake/alert Behavior During Therapy: WFL for tasks assessed/performed Overall Cognitive Status: Within Functional Limits for tasks assessed    Balance     End of Session PT - End of Session Equipment Utilized During Treatment: Gait belt Activity Tolerance: Patient tolerated treatment well Patient left: with call bell/phone within reach;in bed Nurse Communication: Mobility status   GP     Chrisy Hillebrand 11/19/2013, 2:46 PM

## 2013-11-19 NOTE — Progress Notes (Signed)
Patient is currently an inpatient following left total hip replacement 11/18/13.  I visited patient to check in.  She appeared to be doing well.  She did report that she has been nauseated today and not able to keep her food down.  Her nurse has adjusted her pain medication to see if that will reduce her nausea.  She says that her hip does not hurt but is sore.  She has had one physical therapy session which she tolerated well.  I encouraged her to use her incentive spirometry while in bed.  When she is discharged, she will have assistance at home from her sister-in-law.  She denied any questions or concerns at this time.  I encouraged her to call me for any needs.

## 2013-11-19 NOTE — Care Management Note (Signed)
  Page 2 of 2   11/19/2013     3:09:05 PM   CARE MANAGEMENT NOTE 11/19/2013  Patient:  Alexandra Snyder, Alexandra Snyder   Account Number:  [id number]  Date Initiated:  11/19/2013  Documentation initiated by:  Colleen Can  Subjective/Objective Assessment:   dx left hip replacemnt-anterior approach     Action/Plan:   CM spoke with patient. Plans are for her to return to her home where she will have caregiver during the day. RW and 3n1 have been delivered to her room by [employer] dme rep. Genevieve Norlander will provide [facility] services   Anticipated DC Date:  11/20/2013   Anticipated DC Plan:  HOME W HOME HEALTH SERVICES      DC Planning Services  CM consult      [facility] Choice  HOME HEALTH  DURABLE MEDICAL EQUIPMENT   Choice offered to / List presented to:  C-1 Patient   DME arranged  3-N-1  Levan Hurst      DME agency  [facility]     HH arranged  HH-2 PT      [facility] agency  [facility]   Status of service:  Completed, signed off Medicare Important Message given?  NA - LOS <3 / Initial given by admissions (If response is "NO", the following Medicare IM given date fields will be blank) Date Medicare IM given:   Date Additional Medicare IM given:    Discharge Disposition:    Per UR Regulation:    If discussed at Long Length of Stay Meetings, dates discussed:    Comments:

## 2013-11-20 LAB — BASIC METABOLIC PANEL
BUN: 12 mg/dL (ref 6–23)
Calcium: 9.3 mg/dL (ref 8.4–10.5)
Creatinine, Ser: 0.68 mg/dL (ref 0.50–1.10)
GFR calc Af Amer: >90 mL/min (ref >90)

## 2013-11-20 LAB — CBC
MCH: 28.9 pg (ref 26.0–34.0)
MCHC: 33.6 g/dL (ref 30.0–36.0)
MCV: 86.2 fL (ref 78.0–100.0)
Platelets: 178 10*3/uL (ref 150–400)
RDW: 15.2 % (ref 11.5–15.5)
WBC: 13.8 10*3/uL — ABNORMAL HIGH (ref 4.0–10.5)

## 2013-11-20 MED ORDER — HYDROCODONE-ACETAMINOPHEN 5-325 MG PO TABS: 1.0000 | ORAL_TABLET | ORAL | Status: DC | PRN

## 2013-11-20 MED ORDER — RIVAROXABAN 10 MG PO TABS: 10.0000 mg | ORAL_TABLET | ORAL | Status: DC

## 2013-11-20 MED ORDER — FERROUS SULFATE 325 (65 FE) MG PO TABS: 325.0000 mg | ORAL_TABLET | Freq: Three times a day (TID) | ORAL | Status: DC

## 2013-11-20 MED ORDER — ASPIRIN EC 325 MG PO TBEC: 325.0000 mg | DELAYED_RELEASE_TABLET | Freq: Two times a day (BID) | ORAL | Status: AC

## 2013-11-20 MED ORDER — DSS 100 MG PO CAPS: 100.0000 mg | ORAL_CAPSULE | Freq: Two times a day (BID) | ORAL | Status: DC

## 2013-11-20 MED ORDER — POLYETHYLENE GLYCOL 3350 17 G PO PACK: 17.0000 g | PACK | Freq: Two times a day (BID) | ORAL | Status: DC | PRN

## 2013-11-20 NOTE — Progress Notes (Signed)
   Subjective: 2 Days Post-Op Procedure(s) (LRB): LEFT TOTAL HIP ARTHROPLASTY ANTERIOR APPROACH (Left)   Patient reports pain as mild, pain really well controlled. Some nausea and diarrhea yesterday, but feels these have resolved today. No events throughout the night.   Objective:   VITALS:   Filed Vitals:   11/20/13 0448  BP: 167/84  Pulse: 112  Temp: 98.1 F (36.7 C)  Resp: 20    Neurovascular intact Dorsiflexion/Plantar flexion intact Incision: dressing C/D/I No cellulitis present Compartment soft  LABS  Recent Labs  11/19/13 0407 11/20/13 0415  HGB 9.6* 9.0*  HCT 28.4* 26.8*  WBC 10.2 13.8*  PLT 185 178     Recent Labs  11/19/13 0407 11/20/13 0415  NA 135 137  K 3.7 3.9  BUN 13 12  CREATININE 0.75 0.68  GLUCOSE 176* 137*     Assessment/Plan: 2 Days Post-Op Procedure(s) (LRB): LEFT TOTAL HIP ARTHROPLASTY ANTERIOR APPROACH (Left) Up with therapy Discharge home with home health Follow up in 2 weeks at [facility]. Follow up with OLIN,Keller Mikels D in 2 weeks.  Contact information:  [facility] [address] [place] [address] [phone]    Expected ABLA  Treated with iron and will observe   Overweight (BMI 25-29.9)  Estimated body mass index is 26.56 kg/(m^2) as calculated from the following:      Height as of this encounter: 5' (1.524 m).      Weight as of this encounter: 61.689 kg (136 lb).  Patient also counseled that weight may inhibit the healing process  Patient counseled that losing weight will help with future health issues       Anastasio Auerbach. Dylana Shaw   PAC  11/20/2013, 8:25 AM

## 2013-11-20 NOTE — Progress Notes (Signed)
Physical Therapy Treatment Patient Details Name: Alexandra Snyder MRN: [id number] DOB: [birth year] Today's Date: 11/20/2013 Time: 1191-4782 PT Time Calculation (min): 42 min  PT Assessment / Plan / Recommendation  History of Present Illness LEFT TOTAL HIP ARTHROPLASTY ANTERIOR APPROACH (Left)    PT Comments   Marked improvement in activity tolerance   Follow Up Recommendations  Home health PT     Does the patient have the potential to tolerate intense rehabilitation     Barriers to Discharge        Equipment Recommendations  Rolling walker with 5" wheels    Recommendations for Other Services OT consult  Frequency 7X/week   Progress towards PT Goals Progress towards PT goals: Progressing toward goals  Plan Current plan remains appropriate    Precautions / Restrictions Precautions Precautions: Fall Restrictions Weight Bearing Restrictions: Yes LLE Weight Bearing: Partial weight bearing LLE Partial Weight Bearing Percentage or Pounds: 50%   Pertinent Vitals/Pain 4/10; premed, ice packs provided    Mobility  Bed Mobility Bed Mobility: Supine to Sit;Sit to Supine Supine to Sit: 4: Min guard Sit to Supine: 4: Min assist Details for Bed Mobility Assistance: cues for sequence and use of R LE to self assist Transfers Transfers: Sit to Stand;Stand to Sit Sit to Stand: 4: Min guard;From bed;From chair/3-in-1;With upper extremity assist Stand to Sit: 4: Min guard;To bed;To chair/3-in-1;With upper extremity assist Details for Transfer Assistance: cues for LLE placement and use of UEs to self assist Ambulation/Gait Ambulation/Gait Assistance: 4: Min guard Ambulation Distance (Feet): 75 Feet (twice) Assistive device: Rolling walker Ambulation/Gait Assistance Details: cues for posture, sequence, ER on L, position from RW and PWB Gait Pattern: Step-to pattern;Decreased step length - right;Decreased step length - left;Antalgic;Trunk flexed;Shuffle    Exercises Total Joint  Exercises Ankle Circles/Pumps: AROM;Supine;Both;15 reps Quad Sets: AROM;Both;10 reps;Supine Gluteal Sets: AROM;Both;10 reps;Supine Heel Slides: AAROM;Supine;Left;20 reps Hip ABduction/ADduction: AAROM;15 reps;Supine;Left   PT Diagnosis:    PT Problem List:   PT Treatment Interventions:     PT Goals (current goals can now be found in the care plan section) Acute Rehab PT Goals Patient Stated Goal: Home PT Goal Formulation: With patient Time For Goal Achievement: 11/27/13 Potential to Achieve Goals: Good  Visit Information  Last PT Received On: 11/20/13 Assistance Needed: +1 History of Present Illness: LEFT TOTAL HIP ARTHROPLASTY ANTERIOR APPROACH (Left)     Subjective Data  Subjective: Doing much better since I got over the nausea Patient Stated Goal: Home   Cognition  Cognition Arousal/Alertness: Awake/alert Behavior During Therapy: WFL for tasks assessed/performed Overall Cognitive Status: Within Functional Limits for tasks assessed    Balance     End of Session PT - End of Session Equipment Utilized During Treatment: Gait belt Activity Tolerance: Patient tolerated treatment well Patient left: with call bell/phone within reach;in chair Nurse Communication: Mobility status   GP     Alexandra Snyder 11/20/2013, 11:23 AM

## 2013-11-20 NOTE — Progress Notes (Signed)
Physical Therapy Treatment Patient Details Name: Alexandra Snyder MRN: [id number] DOB: 01/03/1934 Today's Date: 11/20/2013 Time: 1211-1238 PT Time Calculation (min): 27 min  PT Assessment / Plan / Recommendation  History of Present Illness LEFT TOTAL HIP ARTHROPLASTY ANTERIOR APPROACH (Left)    PT Comments   Reviewed stairs and car transfers with pt and family members with written instructions for stairs provided.  Follow Up Recommendations  Home health PT     Does the patient have the potential to tolerate intense rehabilitation     Barriers to Discharge        Equipment Recommendations  Rolling walker with 5" wheels    Recommendations for Other Services OT consult  Frequency 7X/week   Progress towards PT Goals Progress towards PT goals: Progressing toward goals  Plan Current plan remains appropriate    Precautions / Restrictions Precautions Precautions: Fall Restrictions Weight Bearing Restrictions: Yes LLE Weight Bearing: Partial weight bearing LLE Partial Weight Bearing Percentage or Pounds: 50%   Pertinent Vitals/Pain 3/10    Mobility  Bed Mobility Bed Mobility: Supine to Sit;Sit to Supine Supine to Sit: 4: Min guard Sit to Supine: 4: Min assist Details for Bed Mobility Assistance: cues for sequence and use of R LE to self assist Transfers Transfers: Sit to Stand;Stand to Sit Sit to Stand: 5: Supervision;With armrests;From chair/3-in-1 Stand to Sit: 5: Supervision;To chair/3-in-1;With armrests Details for Transfer Assistance: cues for LLE placement and use of UEs to self assist Ambulation/Gait Ambulation/Gait Assistance: 4: Min guard;5: Supervision Ambulation Distance (Feet): 44 Feet Assistive device: Rolling walker Ambulation/Gait Assistance Details: cues for posture and position from RW Gait Pattern: Step-to pattern;Decreased step length - right;Decreased step length - left;Antalgic;Trunk flexed;Shuffle Stairs: Yes Stairs Assistance: 4: Min assist Stairs  Assistance Details (indicate cue type and reason): cues for sequence and foot/RW placement - reviewed with pt family and provided written instruction. Stair Management Technique: No rails;Step to pattern;Backwards;With walker Number of Stairs: 4 (single step twice and 2 step)    Exercises Total Joint Exercises Ankle Circles/Pumps: AROM;Supine;Both;15 reps Quad Sets: AROM;Both;10 reps;Supine Gluteal Sets: AROM;Both;10 reps;Supine Heel Slides: AAROM;Supine;Left;20 reps Hip ABduction/ADduction: AAROM;15 reps;Supine;Left   PT Diagnosis:    PT Problem List:   PT Treatment Interventions:     PT Goals (current goals can now be found in the care plan section) Acute Rehab PT Goals Patient Stated Goal: Home PT Goal Formulation: With patient Time For Goal Achievement: 11/27/13 Potential to Achieve Goals: Good  Visit Information  Last PT Received On: 11/20/13 Assistance Needed: +1 History of Present Illness: LEFT TOTAL HIP ARTHROPLASTY ANTERIOR APPROACH (Left)     Subjective Data  Subjective: Doing much better since I got over the nausea Patient Stated Goal: Home   Cognition  Cognition Arousal/Alertness: Awake/alert Behavior During Therapy: WFL for tasks assessed/performed Overall Cognitive Status: Within Functional Limits for tasks assessed    Balance     End of Session PT - End of Session Equipment Utilized During Treatment: Gait belt Activity Tolerance: Patient tolerated treatment well Patient left: with call bell/phone within reach;in chair Nurse Communication: Mobility status   GP     Briannon Boggio 11/20/2013, 12:59 PM

## 2013-11-20 NOTE — Progress Notes (Signed)
Occupational Therapy Treatment Patient Details Name: Alexandra Snyder MRN: [id number] DOB: [birth year] Today's Date: 11/20/2013 Time: 4098-1191 OT Time Calculation (min): 14 min  OT Assessment / Plan / Recommendation  History of present illness LEFT TOTAL HIP ARTHROPLASTY ANTERIOR APPROACH (Left)    OT comments  Pt making good progress.  Needs reinforcement with sequence when walking.  Follow Up Recommendations  Home health OT;Supervision/Assistance - 24 hour    Barriers to Discharge       Equipment Recommendations  3 in 1 bedside comode (delivered)    Recommendations for Other Services    Frequency Min 2X/week   Progress towards OT Goals Progress towards OT goals: Progressing toward goals  Plan      Precautions / Restrictions Precautions Precautions: Fall Restrictions LLE Weight Bearing: Partial weight bearing LLE Partial Weight Bearing Percentage or Pounds: 50%   Pertinent Vitals/Pain LLE sore only.  Repositioned in chair    ADL  Toilet Transfer: Min guard Toilet Transfer Method: Sit to Barista: Bedside commode Tub/Shower Transfer: Minimal assistance Tub/Shower Transfer Method: Science writer: Walk in Scientist, research (physical sciences) Used: Rolling walker Transfers/Ambulation Related to ADLs: ambulated to bathroom; max cues needed for sequence with ambulation.  Pt is PWB ADL Comments: Performed shower transfer.  Pt needed assist to lift LLE completely in and out of shower, but she does initiate movement.  She will sponge bathe initially.  Pt did not want to review sock aid--she doesn't like it as it is too big and she has small socks.  She has 2 reachers:  reviewed use to don pants/underwear.  Pt verbalizes understanding of all    OT Diagnosis:    OT Problem List:   OT Treatment Interventions:     OT Goals(current goals can now be found in the care plan section)    Visit Information  Last OT Received On: 11/20/13 Assistance Needed:  +1 History of Present Illness: LEFT TOTAL HIP ARTHROPLASTY ANTERIOR APPROACH (Left)     Subjective Data      Prior Functioning       Cognition  Cognition Arousal/Alertness: Awake/alert Behavior During Therapy: WFL for tasks assessed/performed Overall Cognitive Status: Within Functional Limits for tasks assessed    Mobility  Transfers Sit to Stand: 4: Min guard;From chair/3-in-1;With upper extremity assist Details for Transfer Assistance: cues for LLE placement    Exercises      Balance     End of Session OT - End of Session Equipment Utilized During Treatment: Rolling walker Activity Tolerance: Patient tolerated treatment well Patient left: in chair;with call bell/phone within reach  GO     Mayari Matus 11/20/2013, 9:09 AM Marica Otter, OTR/L [phone] 11/20/2013

## 2013-11-21 ENCOUNTER — Other Ambulatory Visit (HOSPITAL_COMMUNITY): Payer: Medicare Other

## 2013-11-21 ENCOUNTER — Emergency Department (HOSPITAL_COMMUNITY): Payer: Medicare Other

## 2013-11-21 ENCOUNTER — Encounter (HOSPITAL_COMMUNITY): Payer: Self-pay | Admitting: Emergency Medicine

## 2013-11-21 ENCOUNTER — Inpatient Hospital Stay (HOSPITAL_COMMUNITY)
Admission: EM | Admit: 2013-11-21 | Discharge: 2013-11-25 | DRG: 481 | Disposition: A | Discharge status: Skilled Nursing Facility | Payer: Medicare Other | Attending: Family Medicine | Admitting: Family Medicine

## 2013-11-21 DIAGNOSIS — S72143A Displaced intertrochanteric fracture of unspecified femur, initial encounter for closed fracture: Principal | ICD-10-CM | POA: Diagnosis present

## 2013-11-21 DIAGNOSIS — Z923 Personal history of irradiation: Secondary | ICD-10-CM

## 2013-11-21 DIAGNOSIS — Z87891 Personal history of nicotine dependence: Secondary | ICD-10-CM

## 2013-11-21 DIAGNOSIS — D72829 Elevated white blood cell count, unspecified: Secondary | ICD-10-CM

## 2013-11-21 DIAGNOSIS — M810 Age-related osteoporosis without current pathological fracture: Secondary | ICD-10-CM | POA: Diagnosis present

## 2013-11-21 DIAGNOSIS — E785 Hyperlipidemia, unspecified: Secondary | ICD-10-CM | POA: Diagnosis present

## 2013-11-21 DIAGNOSIS — C50419 Malignant neoplasm of upper-outer quadrant of unspecified female breast: Secondary | ICD-10-CM | POA: Diagnosis present

## 2013-11-21 DIAGNOSIS — D509 Iron deficiency anemia, unspecified: Secondary | ICD-10-CM

## 2013-11-21 DIAGNOSIS — S72141K Displaced intertrochanteric fracture of right femur, subsequent encounter for closed fracture with nonunion: Secondary | ICD-10-CM

## 2013-11-21 DIAGNOSIS — D649 Anemia, unspecified: Secondary | ICD-10-CM

## 2013-11-21 DIAGNOSIS — J449 Chronic obstructive pulmonary disease, unspecified: Secondary | ICD-10-CM

## 2013-11-21 DIAGNOSIS — J4489 Other specified chronic obstructive pulmonary disease: Secondary | ICD-10-CM

## 2013-11-21 DIAGNOSIS — D500 Iron deficiency anemia secondary to blood loss (chronic): Secondary | ICD-10-CM

## 2013-11-21 DIAGNOSIS — S72141P Displaced intertrochanteric fracture of right femur, subsequent encounter for closed fracture with malunion: Secondary | ICD-10-CM

## 2013-11-21 DIAGNOSIS — I498 Other specified cardiac arrhythmias: Secondary | ICD-10-CM | POA: Diagnosis present

## 2013-11-21 DIAGNOSIS — E872 Acidosis, unspecified: Secondary | ICD-10-CM | POA: Diagnosis present

## 2013-11-21 DIAGNOSIS — W010XXA Fall on same level from slipping, tripping and stumbling without subsequent striking against object, initial encounter: Secondary | ICD-10-CM | POA: Diagnosis present

## 2013-11-21 DIAGNOSIS — S72001S Fracture of unspecified part of neck of right femur, sequela: Secondary | ICD-10-CM

## 2013-11-21 DIAGNOSIS — S72001A Fracture of unspecified part of neck of right femur, initial encounter for closed fracture: Secondary | ICD-10-CM

## 2013-11-21 DIAGNOSIS — Z96649 Presence of unspecified artificial hip joint: Secondary | ICD-10-CM

## 2013-11-21 DIAGNOSIS — Z9849 Cataract extraction status, unspecified eye: Secondary | ICD-10-CM

## 2013-11-21 DIAGNOSIS — M169 Osteoarthritis of hip, unspecified: Secondary | ICD-10-CM | POA: Diagnosis present

## 2013-11-21 DIAGNOSIS — Z8041 Family history of malignant neoplasm of ovary: Secondary | ICD-10-CM

## 2013-11-21 DIAGNOSIS — M1610 Unilateral primary osteoarthritis, unspecified hip: Secondary | ICD-10-CM | POA: Diagnosis present

## 2013-11-21 DIAGNOSIS — Y92009 Unspecified place in unspecified non-institutional (private) residence as the place of occurrence of the external cause: Secondary | ICD-10-CM

## 2013-11-21 DIAGNOSIS — E663 Overweight: Secondary | ICD-10-CM

## 2013-11-21 DIAGNOSIS — R52 Pain, unspecified: Secondary | ICD-10-CM | POA: Diagnosis present

## 2013-11-21 DIAGNOSIS — S72141A Displaced intertrochanteric fracture of right femur, initial encounter for closed fracture: Secondary | ICD-10-CM

## 2013-11-21 DIAGNOSIS — S72009A Fracture of unspecified part of neck of unspecified femur, initial encounter for closed fracture: Secondary | ICD-10-CM

## 2013-11-21 DIAGNOSIS — K219 Gastro-esophageal reflux disease without esophagitis: Secondary | ICD-10-CM | POA: Diagnosis present

## 2013-11-21 HISTORY — DX: Nausea with vomiting, unspecified: R11.2

## 2013-11-21 HISTORY — DX: Other specified postprocedural states: Z98.890

## 2013-11-21 LAB — IRON AND TIBC
Iron: 13 ug/dL — ABNORMAL LOW (ref 42–135)
UIBC: 186 ug/dL (ref 125–400)

## 2013-11-21 LAB — RETICULOCYTES
Retic Count, Absolute: 50.8 10*3/uL (ref 19.0–186.0)
Retic Ct Pct: 1.8 % (ref 0.4–3.1)

## 2013-11-21 LAB — BASIC METABOLIC PANEL
BUN: 22 mg/dL (ref 6–23)
CO2: 19 mEq/L (ref 19–32)
Chloride: 101 mEq/L (ref 96–112)
Creatinine, Ser: 0.81 mg/dL (ref 0.50–1.10)
GFR calc Af Amer: 79 mL/min — ABNORMAL LOW (ref >90)
Glucose, Bld: 116 mg/dL — ABNORMAL HIGH (ref 70–99)
Potassium: 3.6 mEq/L (ref 3.5–5.1)

## 2013-11-21 LAB — CBC WITH DIFFERENTIAL/PLATELET
Basophils Absolute: 0 10*3/uL (ref 0.0–0.1)
Basophils Relative: 0 % (ref 0–1)
HCT: 27.6 % — ABNORMAL LOW (ref 36.0–46.0)
Hemoglobin: 9.4 g/dL — ABNORMAL LOW (ref 12.0–15.0)
Lymphocytes Relative: 6 % — ABNORMAL LOW (ref 12–46)
Monocytes Absolute: 2 10*3/uL — ABNORMAL HIGH (ref 0.1–1.0)
Monocytes Relative: 13 % — ABNORMAL HIGH (ref 3–12)
Neutro Abs: 13 10*3/uL — ABNORMAL HIGH (ref 1.7–7.7)
Neutrophils Relative %: 82 % — ABNORMAL HIGH (ref 43–77)
RDW: 15.1 % (ref 11.5–15.5)
WBC: 15.9 10*3/uL — ABNORMAL HIGH (ref 4.0–10.5)

## 2013-11-21 LAB — VITAMIN B12: Vitamin B-12: 597 pg/mL (ref 211–911)

## 2013-11-21 MED ORDER — ENSURE COMPLETE PO LIQD
237.0000 mL | Freq: Two times a day (BID) | ORAL | Status: DC
  Administered 2013-11-21 – 2013-11-24 (×4): 237 mL via ORAL

## 2013-11-21 MED ORDER — SODIUM CHLORIDE 0.9 % IV SOLN
INTRAVENOUS | Status: DC
  Administered 2013-11-21 – 2013-11-23 (×3): via INTRAVENOUS

## 2013-11-21 MED ORDER — SALINE SPRAY 0.65 % NA SOLN
1.0000 | Freq: Three times a day (TID) | NASAL | Status: DC | PRN
  Filled 2013-11-21: qty 44

## 2013-11-21 MED ORDER — ALBUTEROL SULFATE HFA 108 (90 BASE) MCG/ACT IN AERS
2.0000 | INHALATION_SPRAY | RESPIRATORY_TRACT | Status: DC | PRN
  Administered 2013-11-21 (×2): 2 via RESPIRATORY_TRACT

## 2013-11-21 MED ORDER — VITAMIN D3 25 MCG (1000 UNIT) PO TABS
1000.0000 [IU] | ORAL_TABLET | Freq: Two times a day (BID) | ORAL | Status: DC
  Administered 2013-11-21 – 2013-11-25 (×7): 1000 [IU] via ORAL
  Filled 2013-11-21 (×10): qty 1

## 2013-11-21 MED ORDER — HYDROCODONE-ACETAMINOPHEN 5-325 MG PO TABS: 1.0000 | ORAL_TABLET | ORAL | Status: DC | PRN

## 2013-11-21 MED ORDER — FERROUS SULFATE 325 (65 FE) MG PO TABS
325.0000 mg | ORAL_TABLET | Freq: Three times a day (TID) | ORAL | Status: DC
Start: 2013-11-21 — End: 2013-11-25
  Administered 2013-11-21 – 2013-11-25 (×11): 325 mg via ORAL
  Filled 2013-11-21 (×16): qty 1

## 2013-11-21 MED ORDER — ANASTROZOLE 1 MG PO TABS
1.0000 mg | ORAL_TABLET | Freq: Every day | ORAL | Status: DC
  Administered 2013-11-21 – 2013-11-25 (×4): 1 mg via ORAL
  Filled 2013-11-21 (×5): qty 1

## 2013-11-21 MED ORDER — DIPHENHYDRAMINE HCL 50 MG/ML IJ SOLN
12.5000 mg | Freq: Three times a day (TID) | INTRAMUSCULAR | Status: DC | PRN
  Administered 2013-11-21 – 2013-11-25 (×2): 12.5 mg via INTRAVENOUS
  Filled 2013-11-21 (×2): qty 1

## 2013-11-21 MED ORDER — DOCUSATE SODIUM 100 MG PO CAPS
100.0000 mg | ORAL_CAPSULE | Freq: Two times a day (BID) | ORAL | Status: DC
  Administered 2013-11-21 – 2013-11-25 (×6): 100 mg via ORAL
  Filled 2013-11-21 (×10): qty 1

## 2013-11-21 MED ORDER — ALBUTEROL SULFATE (5 MG/ML) 0.5% IN NEBU
2.5000 mg | INHALATION_SOLUTION | Freq: Three times a day (TID) | RESPIRATORY_TRACT | Status: DC
  Administered 2013-11-21 – 2013-11-22 (×5): 2.5 mg via RESPIRATORY_TRACT
  Filled 2013-11-21 (×5): qty 0.5

## 2013-11-21 MED ORDER — HYDROMORPHONE HCL PF 1 MG/ML IJ SOLN
0.5000 mg | INTRAMUSCULAR | Status: AC
  Administered 2013-11-21: 0.5 mg via INTRAVENOUS
  Filled 2013-11-21: qty 1

## 2013-11-21 MED ORDER — ATORVASTATIN CALCIUM 40 MG PO TABS
40.0000 mg | ORAL_TABLET | Freq: Every day | ORAL | Status: DC
  Administered 2013-11-21 – 2013-11-24 (×2): 40 mg via ORAL
  Filled 2013-11-21 (×5): qty 1

## 2013-11-21 MED ORDER — HYDROCODONE-ACETAMINOPHEN 5-325 MG PO TABS
1.0000 | ORAL_TABLET | Freq: Four times a day (QID) | ORAL | Status: DC | PRN
  Administered 2013-11-21: 1 via ORAL
  Administered 2013-11-21 – 2013-11-25 (×3): 2 via ORAL
  Filled 2013-11-21 (×2): qty 2
  Filled 2013-11-21: qty 1
  Filled 2013-11-21: qty 2

## 2013-11-21 MED ORDER — MORPHINE SULFATE 2 MG/ML IJ SOLN
0.5000 mg | INTRAMUSCULAR | Status: DC | PRN
  Administered 2013-11-21: 0.5 mg via INTRAVENOUS
  Filled 2013-11-21: qty 1

## 2013-11-21 MED ORDER — FLUTICASONE PROPIONATE 50 MCG/ACT NA SUSP
1.0000 | Freq: Two times a day (BID) | NASAL | Status: DC | PRN
  Filled 2013-11-21: qty 16

## 2013-11-21 MED ORDER — POLYETHYLENE GLYCOL 3350 17 G PO PACK
17.0000 g | PACK | Freq: Two times a day (BID) | ORAL | Status: DC | PRN
  Filled 2013-11-21: qty 1

## 2013-11-21 NOTE — Progress Notes (Signed)
INITIAL NUTRITION ASSESSMENT  DOCUMENTATION CODES Per approved criteria  -Not Applicable   INTERVENTION: - Ensure Complete BID - Educated pt on importance of nutrition for wound healing - Will continue to monitor   NUTRITION DIAGNOSIS: Increased nutrient needs related to right femur fracture with plans for surgery as evidenced by MD notes.   Goal: Pt to consume >90% of meals/supplements  Monitor:  Weights, labs, intake  Reason for Assessment: Consult  79 y.o. female  Admitting Dx: Fall with right hip pain  ASSESSMENT: Admitted with fall, found to have right intertrochanteric femur fracture with plans to do surgery tomorrow. Met with pt who reports eating well at home with good appetite, at least 2 meals/day. Not on any nutritional supplements. Weight has been stable.   Height: Ht Readings from Last 1 Encounters:  11/18/13 5' (1.524 m)    Weight: Wt Readings from Last 1 Encounters:  11/18/13 136 lb (61.689 kg)    Ideal Body Weight: 100 lb  % Ideal Body Weight: 136%  Wt Readings from Last 10 Encounters:  11/18/13 136 lb (61.689 kg)  11/18/13 136 lb (61.689 kg)  11/11/13 136 lb (61.689 kg)  10/28/13 136 lb 11.2 oz (62.007 kg)  07/23/13 137 lb 8 oz (62.37 kg)  07/08/13 140 lb 1.6 oz (63.549 kg)  06/25/13 137 lb 12.8 oz (62.506 kg)  06/19/13 140 lb 11.2 oz (63.821 kg)  06/13/13 139 lb 11.2 oz (63.368 kg)  06/06/13 139 lb 1.6 oz (63.095 kg)    Usual Body Weight: 136 lb per pt  % Usual Body Weight: 100%  BMI:  26.5 kg/(m^2)  Estimated Nutritional Needs: Kcal: 1300-1500 Protein: 60-75g Fluid: 1.3-1.5L/day  Skin: Intact  Diet Order: General  EDUCATION NEEDS: -Education needs addressed   Intake/Output Summary (Last 24 hours) at 11/21/13 1249 Last data filed at 11/21/13 0735  Gross per 24 hour  Intake      0 ml  Output    640 ml  Net   -640 ml    Last BM: PTA  Labs:   Recent Labs Lab 11/19/13 0407 11/20/13 0415 11/21/13 0615  NA 135  137 135  K 3.7 3.9 3.6  CL 101 104 101  CO2 24 25 19   BUN 13 12 22   CREATININE 0.75 0.68 0.81  CALCIUM 8.8 9.3 9.4  GLUCOSE 176* 137* 116*    CBG (last 3)  No results found for this basename: GLUCAP,  in the last 72 hours  Scheduled Meds: . anastrozole  1 mg Oral Daily  . atorvastatin  40 mg Oral q1800  . cholecalciferol  1,000 Units Oral BID  . docusate sodium  100 mg Oral BID  . ferrous sulfate  325 mg Oral TID PC    Continuous Infusions: . sodium chloride 75 mL/hr at 11/21/13 1011    Past Medical History  Diagnosis Date  . Breast cancer     left  . Asthma   . COPD (chronic obstructive pulmonary disease)   . GERD (gastroesophageal reflux disease)   . History of radiation therapy 05/27/2013-06/23/2013    50 Gy to left breast  . Hypercholesteremia   . Shortness of breath   . Arthritis     hips  . PONV (postoperative nausea and vomiting)     Past Surgical History  Procedure Laterality Date  . Colonoscopy w/ polypectomy  2004  . Breast lumpectomy with needle localization Left 04/21/2013    Procedure: LEFT BREAST WIRE GUIDED  LUMPECTOMY ;  Surgeon: Emelia Loron, MD;  Location: [facility] OR;  Service: General;  Laterality: Left;  . Tonsillectomy      as child  . Eye surgery Bilateral 2010    Cataract removal  . Total hip arthroplasty Left 11/18/2013    Procedure: LEFT TOTAL HIP ARTHROPLASTY ANTERIOR APPROACH;  Surgeon: Shelda Pal, MD;  Location: [facility] ORS;  Service: Orthopedics;  Laterality: Left;    Levon Hedger MS, RD, LDN [phone] Pager [phone] After Hours Pager

## 2013-11-21 NOTE — H&P (Signed)
[facility] History and Physical  ADEAN MILOSEVIC ZOX:[id number] DOB: 1934-07-07 DOA: 11/21/2013   PCP: [name], MD   Chief Complaint: fall with right hip pain  HPI:  79 year old female with a history of COPD, left breast cancer, and hyperlipidemia presented to the emergency department after a mechanical fall at home. The patient had an elective right total hip arthroplasty on 11/18/2013 by Dr. Charlann Boxer. The patient was started on rivaroxaban 10 mg daily for DVT prophylaxis after the surgery. The patient on home after surgery. This morning, the patient was trying to get up from the couch. In the process of shuffling between her furniture the patient fell onto her right hip. There was no loss of consciousness. The patient was able to get to a telephone at [facility]. The patient denied any fevers, chills, chest discomfort, shortness of breath, nausea, vomiting, diarrhea.  In the ED, x-rays revealed a right femur intertrochanteric fracture. Dr. Charlann Boxer was contacted and has seen the patient. Because the patient is on rivaroxaban, surgery is planned on 11/22/2013. BMP was unremarkable except for a mild metabolic acidosis with a CO2 of 19. CBC revealed WBC 15.9. Hemoglobin 9.4, platelets 190,000. Assessment/Plan: Right intertrochanteric femur fracture -Secondary to mechanical fall -Dr. Charlann Boxer plans surgery on 11/22/13 -hold Xarelto today -pain control -stool softeners COPD -stable -albuterol MDI prn Left Breast Cancer -continue Arimidex Leukocytosis -Likely due to acute medical condition -Check UA with reflex to culture -trend Anemia -may have been from recent THA -continue iron -check iron stores, B12, RBC folate, retic -FOBT will be positive on iron; monitor for hematochezia      Past Medical History  Diagnosis Date  . Breast cancer     left  . Asthma   . COPD (chronic obstructive pulmonary disease)   . GERD (gastroesophageal reflux disease)   . History of  radiation therapy 05/27/2013-06/23/2013    50 Gy to left breast  . Hypercholesteremia   . Shortness of breath   . Arthritis     hips   Past Surgical History  Procedure Laterality Date  . Colonoscopy w/ polypectomy  2004  . Breast lumpectomy with needle localization Left 04/21/2013    Procedure: LEFT BREAST WIRE GUIDED  LUMPECTOMY ;  Surgeon: Emelia Loron, MD;  Location: [facility] OR;  Service: General;  Laterality: Left;  . Tonsillectomy      as child  . Eye surgery Bilateral 2010    Cataract removal  . Total hip arthroplasty Left 11/18/2013    Procedure: LEFT TOTAL HIP ARTHROPLASTY ANTERIOR APPROACH;  Surgeon: Shelda Pal, MD;  Location: [facility] ORS;  Service: Orthopedics;  Laterality: Left;   Social History:  reports that she quit smoking about 10 years ago. Her smoking use included Cigarettes. She has a 41 pack-year smoking history. She has never used smokeless tobacco. She reports that she does not drink alcohol or use illicit drugs.   Family History  Problem Relation Age of Onset  . Ovarian cancer Sister   . Hypertension Mother   . Heart Problems Brother     Pacemaker     Allergies  Allergen Reactions  . Celebrex [Celecoxib] Itching    Hands itch  . Hydrocodone Itching      Prior to Admission medications   Medication Sig Start Date End Date Taking? Authorizing Provider  albuterol (PROVENTIL HFA;VENTOLIN HFA) 108 (90 BASE) MCG/ACT inhaler Inhale 2 puffs into the lungs every 4 (four) hours as needed for wheezing.    Yes Historical Provider, MD  anastrozole (ARIMIDEX) 1 MG tablet Take 1 mg by mouth daily. 08/27/13  Yes Victorino December, MD  cholecalciferol (VITAMIN D) 1000 UNITS tablet Take 1,000 Units by mouth 2 (two) times daily.   Yes Historical Provider, MD  ibuprofen (ADVIL,MOTRIN) 200 MG tablet Take 200-400 mg by mouth every 6 (six) hours as needed for moderate pain.   Yes Historical Provider, MD  aspirin EC 325 MG tablet Take 1 tablet (325 mg total) by mouth 2 (two)  times daily. Take for 4 weeks. 11/20/13 12/20/13  Genelle Gather Babish, PA-C  docusate sodium 100 MG CAPS Take 100 mg by mouth 2 (two) times daily. 11/20/13   Genelle Gather Babish, PA-C  ferrous sulfate 325 (65 FE) MG tablet Take 1 tablet (325 mg total) by mouth 3 (three) times daily after meals. 11/20/13   Genelle Gather Babish, PA-C  HYDROcodone-acetaminophen (NORCO/VICODIN) 5-325 MG per tablet Take 1-2 tablets by mouth every 4 (four) hours as needed for moderate pain. 11/20/13   Genelle Gather Babish, PA-C  polyethylene glycol [facility] / GLYCOLAX) packet Take 17 g by mouth 2 (two) times daily as needed. 11/20/13   Genelle Gather Babish, PA-C  rivaroxaban (XARELTO) 10 MG TABS tablet Take 1 tablet (10 mg total) by mouth daily. 11/20/13   Genelle Gather Babish, PA-C  rosuvastatin (CRESTOR) 20 MG tablet Take 20 mg by mouth daily.    Historical Provider, MD    Review of Systems:  Constitutional:  No weight loss, night sweats, Fevers, chills, fatigue.  Head&Eyes: No headache.  No vision loss.  No eye pain or scotoma ENT:  No Difficulty swallowing,Tooth/dental problems,Sore throat,   Cardio-vascular:  No chest pain, Orthopnea, PND, swelling in lower extremities,  dizziness, palpitations  GI:  No  abdominal pain, nausea, vomiting, diarrhea, loss of appetite, hematochezia, melena, heartburn, indigestion, Resp:  No shortness of breath with exertion or at rest. No cough. No coughing up of blood .No wheezing.No chest wall deformity  Skin:  no rash or lesions.  GU:  no dysuria, change in color of urine, no urgency or frequency. No flank pain.  Musculoskeletal:  Complains of right hip/right leg pain with decreased range of motion No back pain.  Psych:  No change in mood or affect. No depression or anxiety. Neurologic: No headache, no dysesthesia, no focal weakness, no vision loss. No syncope  Physical Exam: Filed Vitals:   11/21/13 0359  BP: 144/52  Pulse: 97  Temp: 98.1 F (36.7 C)   TempSrc: Oral  Resp: 16  SpO2: 100%   General:  A&O x 3, NAD, nontoxic, pleasant/cooperative Head/Eye: No conjunctival hemorrhage, no icterus, [place]/AT, No nystagmus ENT:  No icterus,  No thrush, good dentition, no pharyngeal exudate Neck:  No masses, no lymphadenpathy CV:  RRR, no rub, no gallop, no S3 Lung:  Diminished breath sounds at the bases but clear to auscultation. No wheezing. Good air movement. Abdomen: soft/NT, +BS, nondistended, no peritoneal signs Ext: No cyanosis, No rashes, No petechiae, No lymphangitis, No edema; right lower extremity is externally rotated and shortened Neuro: CNII-XII intact, sensation intact in all 4 extremities  Labs on Admission:  Basic Metabolic Panel:  Recent Labs Lab 11/19/13 0407 11/20/13 0415 11/21/13 0615  NA 135 137 135  K 3.7 3.9 3.6  CL 101 104 101  CO2 24 25 19   GLUCOSE 176* 137* 116*  BUN 13 12 22   CREATININE 0.75 0.68 0.81  CALCIUM 8.8 9.3 9.4   Liver Function Tests: No results found for this basename:  AST, ALT, ALKPHOS, BILITOT, PROT, ALBUMIN,  in the last 168 hours No results found for this basename: LIPASE, AMYLASE,  in the last 168 hours No results found for this basename: AMMONIA,  in the last 168 hours CBC:  Recent Labs Lab 11/19/13 0407 11/20/13 0415 11/21/13 0615  WBC 10.2 13.8* 15.9*  NEUTROABS  --   --  13.0*  HGB 9.6* 9.0* 9.4*  HCT 28.4* 26.8* 27.6*  MCV 85.0 86.2 86.0  PLT 185 178 190   Cardiac Enzymes: No results found for this basename: CKTOTAL, CKMB, CKMBINDEX, TROPONINI,  in the last 168 hours BNP: No components found with this basename: POCBNP,  CBG: No results found for this basename: GLUCAP,  in the last 168 hours  Radiological Exams on Admission: Dg Hip Complete Right  11/21/2013   CLINICAL DATA:  Right hip pain after a fall.  EXAM: RIGHT HIP - COMPLETE 2+ VIEW  COMPARISON:  None.  FINDINGS: There is a comminuted appearing fracture of the right hip involving the intertrochanteric and sub  trochanteric region. There is medial displacement and impaction of the distal fracture fragment with varus angulation. There is no dislocation of the hip joint. There are prominent degenerative changes in the hip with prominent osteophytes on the acetabular and femoral sides. Incidental note of a previous left hip arthroplasty. The pelvis appears intact.  IMPRESSION: Comminuted fracture of the intertrochanteric and sub trochanteric region of the proximal right femur.   Electronically Signed   By: Burman Nieves M.D.   On: 11/21/2013 05:52   Dg Femur Right  11/21/2013   CLINICAL DATA:  Severe right hip pain after a fall.  EXAM: RIGHT FEMUR - 2 VIEW  COMPARISON:  None.  FINDINGS: There is an intertrochanteric and subtrochanteric fracture of the proximal right femur with medial displacement and varus angulation of the distal fracture fragments. There is no dislocation of the hip joint. The distal femur appears intact.  IMPRESSION: Intertrochanteric/subtrochanteric fracture of the proximal right femur.   Electronically Signed   By: Burman Nieves M.D.   On: 11/21/2013 05:51   Ct Head Wo Contrast  11/21/2013   CLINICAL DATA:  Fall  EXAM: CT HEAD WITHOUT CONTRAST  TECHNIQUE: Contiguous axial images were obtained from the base of the skull through the vertex without intravenous contrast.  COMPARISON:  None available  FINDINGS: There is mild generalized atrophy. Scattered and confluent hypodensity within the periventricular and deep white matter is most consistent with chronic small vessel ischemic changes.  There is no acute intracranial hemorrhage or infarct. No mass lesion or midline shift. Gray-white matter differentiation is well maintained. Ventricles are normal in size without evidence of hydrocephalus. CSF containing spaces are within normal limits. No extra-axial fluid collection.  The calvarium is intact.  Orbital soft tissues are within normal limits.  The paranasal sinuses and mastoid air cells are  well pneumatized and free of fluid.  Scalp soft tissues are unremarkable.  IMPRESSION: 1. No acute intracranial process. 2. Mild atrophy and chronic microvascular ischemic disease.   Electronically Signed   By: Rise Mu M.D.   On: 11/21/2013 06:10    EKG: Independently reviewed. pending    Time spent:60 minutes Code Status:   FULL Family Communication:  No Family at bedside   Jeyren Danowski, DO  [facility] Pager [phone]  If 7PM-7AM, please contact night-coverage [email or web address] Password [facility] 11/21/2013, 8:33 AM

## 2013-11-21 NOTE — Anesthesia Preprocedure Evaluation (Addendum)
Anesthesia Evaluation  Patient identified by MRN, date of birth, ID band Patient awake    Reviewed: Allergy & Precautions, H&P , NPO status , Patient's Chart, lab work & pertinent test results  History of Anesthesia Complications (+) PONV  Airway Mallampati: II TM Distance: >3 FB Neck ROM: full    Dental  (+) Edentulous Upper and Edentulous Lower   Pulmonary shortness of breath and with exertion, asthma , COPD COPD inhaler, former smoker,  Moderate COPD.  No respiratory distress breath sounds clear to auscultation  Pulmonary exam normal       Cardiovascular Rhythm:regular Rate:Normal     Neuro/Psych negative neurological ROS  negative psych ROS   GI/Hepatic negative GI ROS, Neg liver ROS, GERD-  ,  Endo/Other  negative endocrine ROS  Renal/GU negative Renal ROS  negative genitourinary   Musculoskeletal   Abdominal   Peds  Hematology negative hematology ROS (+) anemia , hgb 7   Anesthesia Other Findings Breast Cancer  Reproductive/Obstetrics negative OB ROS                         Anesthesia Physical Anesthesia Plan  ASA: III  Anesthesia Plan: General   Post-op Pain Management:    Induction: Intravenous  Airway Management Planned: Oral ETT  Additional Equipment:   Intra-op Plan:   Post-operative Plan: Extubation in OR  Informed Consent: I have reviewed the patients History and Physical, chart, labs and discussed the procedure including the risks, benefits and alternatives for the proposed anesthesia with the patient or authorized representative who has indicated his/her understanding and acceptance.   Dental Advisory Given  Plan Discussed with: CRNA and Surgeon  Anesthesia Plan Comments:         Anesthesia Quick Evaluation

## 2013-11-21 NOTE — ED Provider Notes (Signed)
CSN: [id number]     Arrival date & time 11/21/13  0356 History   First MD Initiated Contact with Patient 11/21/13 0405     Chief Complaint  Patient presents with  . Hip Pain   (Consider location/radiation/quality/duration/timing/severity/associated sxs/prior Treatment) Patient is a 79 y.o. female presenting with hip pain. The history is provided by the patient.  Hip Pain This is a new problem. The current episode started 1 to 2 hours ago. The problem occurs constantly. The problem has not changed since onset.Pertinent negatives include no chest pain, no abdominal pain, no headaches and no shortness of breath. Exacerbated by: movement of right hip, palpation. Nothing relieves the symptoms. She has tried nothing for the symptoms. The treatment provided no relief.    Past Medical History  Diagnosis Date  . Breast cancer     left  . Asthma   . COPD (chronic obstructive pulmonary disease)   . GERD (gastroesophageal reflux disease)   . History of radiation therapy 05/27/2013-06/23/2013    50 Gy to left breast  . Hypercholesteremia   . Shortness of breath   . Arthritis     hips   Past Surgical History  Procedure Laterality Date  . Colonoscopy w/ polypectomy  2004  . Breast lumpectomy with needle localization Left 04/21/2013    Procedure: LEFT BREAST WIRE GUIDED  LUMPECTOMY ;  Surgeon: Emelia Loron, MD;  Location: [facility] OR;  Service: General;  Laterality: Left;  . Tonsillectomy      as child  . Eye surgery Bilateral 2010    Cataract removal  . Total hip arthroplasty Left 11/18/2013    Procedure: LEFT TOTAL HIP ARTHROPLASTY ANTERIOR APPROACH;  Surgeon: Shelda Pal, MD;  Location: [facility] ORS;  Service: Orthopedics;  Laterality: Left;   Family History  Problem Relation Age of Onset  . Ovarian cancer Sister   . Hypertension Mother   . Heart Problems Brother     Pacemaker   History  Substance Use Topics  . Smoking status: Former Smoker -- 1.00 packs/day for 41 years    Types:  Cigarettes    Quit date: 05/08/2003  . Smokeless tobacco: Never Used  . Alcohol Use: No   OB History   Grav Para Term Preterm Abortions TAB SAB Ect Mult Living                 Obstetric Comments   13 at first period Took estrogen for 2 years No childern     Review of Systems  Constitutional: Negative for fever and fatigue.  HENT: Negative for congestion and drooling.   Eyes: Negative for pain.  Respiratory: Negative for cough and shortness of breath.   Cardiovascular: Negative for chest pain.  Gastrointestinal: Negative for nausea, vomiting, abdominal pain and diarrhea.  Genitourinary: Negative for dysuria and hematuria.  Musculoskeletal: Negative for back pain, gait problem and neck pain.  Skin: Negative for color change.  Neurological: Negative for dizziness and headaches.  Hematological: Negative for adenopathy.  Psychiatric/Behavioral: Negative for behavioral problems.  All other systems reviewed and are negative.    Allergies  Celebrex and Hydrocodone  Home Medications   Current Outpatient Rx  Name  Route  Sig  Dispense  Refill  . albuterol (PROVENTIL HFA;VENTOLIN HFA) 108 (90 BASE) MCG/ACT inhaler   Inhalation   Inhale 2 puffs into the lungs every 4 (four) hours as needed for wheezing.          Marland Kitchen anastrozole (ARIMIDEX) 1 MG tablet   Oral  Take 1 mg by mouth daily.         Marland Kitchen aspirin EC 325 MG tablet   Oral   Take 1 tablet (325 mg total) by mouth 2 (two) times daily. Take for 4 weeks.   60 tablet   0     Take for 4 weeks.  Start the day after finishing t ...   . cholecalciferol (VITAMIN D) 1000 UNITS tablet   Oral   Take 1,000 Units by mouth 2 (two) times daily.         Marland Kitchen docusate sodium 100 MG CAPS   Oral   Take 100 mg by mouth 2 (two) times daily.   10 capsule   0   . ferrous sulfate 325 (65 FE) MG tablet   Oral   Take 1 tablet (325 mg total) by mouth 3 (three) times daily after meals.      3   . HYDROcodone-acetaminophen  (NORCO/VICODIN) 5-325 MG per tablet   Oral   Take 1-2 tablets by mouth every 4 (four) hours as needed for moderate pain.   50 tablet   0   . polyethylene glycol (MIRALAX / GLYCOLAX) packet   Oral   Take 17 g by mouth 2 (two) times daily as needed.   14 each   0   . rivaroxaban (XARELTO) 10 MG TABS tablet   Oral   Take 1 tablet (10 mg total) by mouth daily.   12 tablet   0   . rosuvastatin (CRESTOR) 20 MG tablet   Oral   Take 20 mg by mouth daily.          BP 144/52  Pulse 97  Temp(Src) 98.1 F (36.7 C) (Oral)  Resp 16  SpO2 100% Physical Exam  Nursing note and vitals reviewed. Constitutional: She is oriented to person, place, and time. She appears well-developed and well-nourished.  HENT:  Head: Normocephalic.  Mouth/Throat: Oropharynx is clear and moist. No oropharyngeal exudate.  Eyes: Conjunctivae and EOM are normal. Pupils are equal, round, and reactive to light.  Neck: Normal range of motion. Neck supple.  No vertebral ttp.   Cardiovascular: Normal rate, regular rhythm, normal heart sounds and intact distal pulses.  Exam reveals no gallop and no friction rub.   No murmur heard. Pulmonary/Chest: Effort normal and breath sounds normal. No respiratory distress. She has no wheezes.  Abdominal: Soft. Bowel sounds are normal. There is no tenderness. There is no rebound and no guarding.  Musculoskeletal: Normal range of motion. She exhibits no edema.  Tenderness to palpation of right lateral hip and right proximal femur.  Sensation is intact in bilateral lower extremities.  2+ distal pulses in the lower extremities.  Mild bruising to right lateral elbow without focal tenderness to palpation. Normal range of motion of the right elbow.  Neurological: She is alert and oriented to person, place, and time.  Skin: Skin is warm and dry.  Psychiatric: She has a normal mood and affect. Her behavior is normal.    ED Course  Procedures (including critical care time) Labs  Review Labs Reviewed  CBC WITH DIFFERENTIAL - Abnormal; Notable for the following:    WBC 15.9 (*)    RBC 3.21 (*)    Hemoglobin 9.4 (*)    HCT 27.6 (*)    Neutrophils Relative % 82 (*)    Neutro Abs 13.0 (*)    Lymphocytes Relative 6 (*)    Monocytes Relative 13 (*)    Monocytes Absolute 2.0 (*)  All other components within normal limits  BASIC METABOLIC PANEL - Abnormal; Notable for the following:    Glucose, Bld 116 (*)    GFR calc non Af Amer 68 (*)    GFR calc Af Amer 79 (*)    All other components within normal limits   Imaging Review Dg Hip Complete Right  11/21/2013   CLINICAL DATA:  Right hip pain after a fall.  EXAM: RIGHT HIP - COMPLETE 2+ VIEW  COMPARISON:  None.  FINDINGS: There is a comminuted appearing fracture of the right hip involving the intertrochanteric and sub trochanteric region. There is medial displacement and impaction of the distal fracture fragment with varus angulation. There is no dislocation of the hip joint. There are prominent degenerative changes in the hip with prominent osteophytes on the acetabular and femoral sides. Incidental note of a previous left hip arthroplasty. The pelvis appears intact.  IMPRESSION: Comminuted fracture of the intertrochanteric and sub trochanteric region of the proximal right femur.   Electronically Signed   By: Burman Nieves M.D.   On: 11/21/2013 05:52   Dg Femur Right  11/21/2013   CLINICAL DATA:  Severe right hip pain after a fall.  EXAM: RIGHT FEMUR - 2 VIEW  COMPARISON:  None.  FINDINGS: There is an intertrochanteric and subtrochanteric fracture of the proximal right femur with medial displacement and varus angulation of the distal fracture fragments. There is no dislocation of the hip joint. The distal femur appears intact.  IMPRESSION: Intertrochanteric/subtrochanteric fracture of the proximal right femur.   Electronically Signed   By: Burman Nieves M.D.   On: 11/21/2013 05:51   Ct Head Wo Contrast  11/21/2013    CLINICAL DATA:  Fall  EXAM: CT HEAD WITHOUT CONTRAST  TECHNIQUE: Contiguous axial images were obtained from the base of the skull through the vertex without intravenous contrast.  COMPARISON:  None available  FINDINGS: There is mild generalized atrophy. Scattered and confluent hypodensity within the periventricular and deep white matter is most consistent with chronic small vessel ischemic changes.  There is no acute intracranial hemorrhage or infarct. No mass lesion or midline shift. Gray-white matter differentiation is well maintained. Ventricles are normal in size without evidence of hydrocephalus. CSF containing spaces are within normal limits. No extra-axial fluid collection.  The calvarium is intact.  Orbital soft tissues are within normal limits.  The paranasal sinuses and mastoid air cells are well pneumatized and free of fluid.  Scalp soft tissues are unremarkable.  IMPRESSION: 1. No acute intracranial process. 2. Mild atrophy and chronic microvascular ischemic disease.   Electronically Signed   By: Rise Mu M.D.   On: 11/21/2013 06:10    EKG Interpretation   None       MDM   1. Closed right hip fracture, initial encounter    4:48 AM 79 y.o. female on xarelto s/p left hip replacement pw mechanical fall after getting up to use the bathroom pta. Pt denies hitting head or loc. She is afebrile and vital signs are unremarkable here. Will get screening imaging and lab work.  Found to have right hip fx. I spoke w/ Dr. Charlann Boxer who recommends holding xarelto. He will see the pt. Consulted [facility] for [facility].     Junius Argyle, MD 11/21/13 [phone]

## 2013-11-21 NOTE — ED Notes (Signed)
Bed: QM57 Expected date:  Expected time:  Means of arrival:  Comments: EMS 79yo F, fall c/o rt hip pain + shortening and rotation

## 2013-11-21 NOTE — ED Notes (Signed)
Pt. In xray 

## 2013-11-21 NOTE — ED Notes (Signed)
Per EMS pt comes from home where she slipped and fell using walker and landed on right hip with recent left hip replacement. Pt has had of fentynal and is alert and oriented.

## 2013-11-21 NOTE — Consult Note (Signed)
Reason for Consult: Right intertrochanteric femur fracture Referring Physician: Emergency Room  Alexandra Snyder is an 79 y.o. female.  HPI:  79 yo female well known to me from recent (11/18/13) left THR.  She had been discharged to home just yesterday as she had done very well in the hospital.  Unfortunately she got off the sofa early this am to go to the bathroom when she was shuffling between furniture she stumbled and fell onto her right hip. No chest pains, no dizziness, no loss of conscious  Only surgical pain left hip  Past Medical History  Diagnosis Date  . Breast cancer     left  . Asthma   . COPD (chronic obstructive pulmonary disease)   . GERD (gastroesophageal reflux disease)   . History of radiation therapy 05/27/2013-06/23/2013    50 Gy to left breast  . Hypercholesteremia   . Shortness of breath   . Arthritis     hips    Past Surgical History  Procedure Laterality Date  . Colonoscopy w/ polypectomy  2004  . Breast lumpectomy with needle localization Left 04/21/2013    Procedure: LEFT BREAST WIRE GUIDED  LUMPECTOMY ;  Surgeon: Emelia Loron, MD;  Location: [facility] OR;  Service: General;  Laterality: Left;  . Tonsillectomy      as child  . Eye surgery Bilateral 2010    Cataract removal  . Total hip arthroplasty Left 11/18/2013    Procedure: LEFT TOTAL HIP ARTHROPLASTY ANTERIOR APPROACH;  Surgeon: Shelda Pal, MD;  Location: [facility] ORS;  Service: Orthopedics;  Laterality: Left;    Family History  Problem Relation Age of Onset  . Ovarian cancer Sister   . Hypertension Mother   . Heart Problems Brother     Pacemaker    Social History:  reports that she quit smoking about 10 years ago. Her smoking use included Cigarettes. She has a 41 pack-year smoking history. She has never used smokeless tobacco. She reports that she does not drink alcohol or use illicit drugs.  Allergies:  Allergies  Allergen Reactions  . Celebrex [Celecoxib] Itching    Hands itch  .  Hydrocodone Itching    Medications: I have reviewed the patient's current medications. Scheduled:  Results for orders placed during the hospital encounter of 11/21/13 (from the past 24 hour(s))  CBC WITH DIFFERENTIAL     Status: Abnormal   Collection Time    11/21/13  6:15 AM      Result Value Range   WBC 15.9 (*) 4.0 - 10.5 K/uL   RBC 3.21 (*) 3.87 - 5.11 MIL/uL   Hemoglobin 9.4 (*) 12.0 - 15.0 g/dL   HCT 40.9 (*) 81.1 - 91.4 %   MCV 86.0  78.0 - 100.0 fL   MCH 29.3  26.0 - 34.0 pg   MCHC 34.1  30.0 - 36.0 g/dL   RDW 78.2  95.6 - 21.3 %   Platelets 190  150 - 400 K/uL   Neutrophils Relative % 82 (*) 43 - 77 %   Neutro Abs 13.0 (*) 1.7 - 7.7 K/uL   Lymphocytes Relative 6 (*) 12 - 46 %   Lymphs Abs 0.9  0.7 - 4.0 K/uL   Monocytes Relative 13 (*) 3 - 12 %   Monocytes Absolute 2.0 (*) 0.1 - 1.0 K/uL   Eosinophils Relative 0  0 - 5 %   Eosinophils Absolute 0.0  0.0 - 0.7 K/uL   Basophils Relative 0  0 - 1 %  Basophils Absolute 0.0  0.0 - 0.1 K/uL  BASIC METABOLIC PANEL     Status: Abnormal   Collection Time    11/21/13  6:15 AM      Result Value Range   Sodium 135  135 - 145 mEq/L   Potassium 3.6  3.5 - 5.1 mEq/L   Chloride 101  96 - 112 mEq/L   CO2 19  19 - 32 mEq/L   Glucose, Bld 116 (*) 70 - 99 mg/dL   BUN 22  6 - 23 mg/dL   Creatinine, Ser 1.61  0.50 - 1.10 mg/dL   Calcium 9.4  8.4 - 09.6 mg/dL   GFR calc non Af Amer 68 (*) >90 mL/min   GFR calc Af Amer 79 (*) >90 mL/min    X-ray: CLINICAL DATA: Right hip pain after a fall.   EXAM:   RIGHT HIP - COMPLETE 2+ VIEW   COMPARISON: None.   FINDINGS:  There is a comminuted appearing fracture of the right hip involving  the intertrochanteric and sub trochanteric region. There is medial  displacement and impaction of the distal fracture fragment with  varus angulation. There is no dislocation of the hip joint. There  are prominent degenerative changes in the hip with prominent  osteophytes on the acetabular and  femoral sides. Incidental note of  a previous left hip arthroplasty. The pelvis appears intact.   IMPRESSION:  Comminuted fracture of the intertrochanteric and sub trochanteric  region of the proximal right femur.   Electronically Signed  By: Burman Nieves M.D.   Review of Systems  Constitutional: Negative for fever, chills and diaphoresis.  HENT: Negative for nosebleeds.   Respiratory: Negative for cough, sputum production and shortness of breath.   Cardiovascular: Negative for chest pain.  Gastrointestinal: Negative for nausea, abdominal pain, diarrhea and constipation.  Genitourinary: Negative for dysuria and frequency.  Skin: Negative for rash.  Neurological: Negative for dizziness, weakness and headaches.   Post surgical pain left hip expected but not exceedingly aggravated Right hip pain after fall   Blood pressure 144/52, pulse 97, temperature 98.1 F (36.7 C), temperature source Oral, resp. rate 16, SpO2 100.00%.  Physical Exam  Constitutional: She is oriented to person, place, and time. She appears well-nourished. She appears distressed.  HENT:  Head: Atraumatic.  Cardiovascular: Normal rate and normal heart sounds.   Respiratory: Effort normal and breath sounds normal. No respiratory distress. She has no wheezes.  GI: Soft. Bowel sounds are normal. She exhibits no distension.  Neurological: She is alert and oriented to person, place, and time.  Psychiatric: She has a normal mood and affect.   Right lower extremity shortened externally rotated, pain with any movement, NVI Left hip dressing dry  Assessment/Plan: Right intertrochanteric femur fracture  Due to Xarelto use will have her admitted and plan to do surgery Saturday Regular diet today NPO after MN tonight Consent  Mava Suares D 11/21/2013, 7:57 AM

## 2013-11-22 ENCOUNTER — Encounter (HOSPITAL_COMMUNITY)
Admission: EM | Disposition: A | Discharge status: Skilled Nursing Facility | Payer: Self-pay | Source: Home / Self Care | Attending: Internal Medicine

## 2013-11-22 ENCOUNTER — Inpatient Hospital Stay (HOSPITAL_COMMUNITY): Payer: Medicare Other | Admitting: Anesthesiology

## 2013-11-22 ENCOUNTER — Encounter (HOSPITAL_COMMUNITY): Payer: Medicare Other | Admitting: Anesthesiology

## 2013-11-22 ENCOUNTER — Inpatient Hospital Stay (HOSPITAL_COMMUNITY): Payer: Medicare Other

## 2013-11-22 DIAGNOSIS — IMO0002 Reserved for concepts with insufficient information to code with codable children: Secondary | ICD-10-CM

## 2013-11-22 DIAGNOSIS — D509 Iron deficiency anemia, unspecified: Secondary | ICD-10-CM

## 2013-11-22 HISTORY — PX: FEMUR IM NAIL: SHX1597

## 2013-11-22 LAB — CBC
HCT: 20.6 % — ABNORMAL LOW (ref 36.0–46.0)
Hemoglobin: 7.1 g/dL — ABNORMAL LOW (ref 12.0–15.0)
MCH: 29.8 pg (ref 26.0–34.0)
MCHC: 34.5 g/dL (ref 30.0–36.0)
MCV: 86.6 fL (ref 78.0–100.0)

## 2013-11-22 LAB — FOLATE RBC: RBC Folate: 1704 ng/mL — ABNORMAL HIGH (ref >280)

## 2013-11-22 LAB — HEMOGLOBIN AND HEMATOCRIT, BLOOD: HCT: 31.1 % — ABNORMAL LOW (ref 36.0–46.0)

## 2013-11-22 LAB — BASIC METABOLIC PANEL
BUN: 19 mg/dL (ref 6–23)
Chloride: 103 mEq/L (ref 96–112)
Creatinine, Ser: 0.76 mg/dL (ref 0.50–1.10)
GFR calc non Af Amer: 79 mL/min — ABNORMAL LOW (ref >90)
Glucose, Bld: 128 mg/dL — ABNORMAL HIGH (ref 70–99)
Potassium: 3.7 mEq/L (ref 3.5–5.1)

## 2013-11-22 SURGERY — INSERTION, INTRAMEDULLARY ROD, FEMUR
Anesthesia: General | Site: Hip | Laterality: Right | Wound class: Clean

## 2013-11-22 MED ORDER — PROPOFOL 10 MG/ML IV BOLUS
INTRAVENOUS | Status: DC | PRN
  Administered 2013-11-22: 80 mg via INTRAVENOUS
  Administered 2013-11-22: 40 mg via INTRAVENOUS

## 2013-11-22 MED ORDER — ONDANSETRON HCL 4 MG/2ML IJ SOLN: 4.0000 mg | Freq: Four times a day (QID) | INTRAMUSCULAR | Status: DC | PRN

## 2013-11-22 MED ORDER — HYDROMORPHONE 0.3 MG/ML IV SOLN: INTRAVENOUS | Status: DC

## 2013-11-22 MED ORDER — SODIUM CHLORIDE 0.9 % IV SOLN
INTRAVENOUS | Status: DC
  Administered 2013-11-22 – 2013-11-24 (×2): via INTRAVENOUS
  Filled 2013-11-22 (×9): qty 1000

## 2013-11-22 MED ORDER — LIDOCAINE HCL (CARDIAC) 20 MG/ML IV SOLN
INTRAVENOUS | Status: AC
  Filled 2013-11-22: qty 5

## 2013-11-22 MED ORDER — SODIUM CHLORIDE 0.9 % IN NEBU
INHALATION_SOLUTION | RESPIRATORY_TRACT | Status: AC
  Filled 2013-11-22: qty 3

## 2013-11-22 MED ORDER — CEFAZOLIN SODIUM-DEXTROSE 2-3 GM-% IV SOLR
INTRAVENOUS | Status: AC
  Filled 2013-11-22: qty 50

## 2013-11-22 MED ORDER — DEXAMETHASONE SODIUM PHOSPHATE 10 MG/ML IJ SOLN
INTRAMUSCULAR | Status: AC
  Filled 2013-11-22: qty 1

## 2013-11-22 MED ORDER — FENTANYL CITRATE 0.05 MG/ML IJ SOLN
INTRAMUSCULAR | Status: AC
  Filled 2013-11-22: qty 5

## 2013-11-22 MED ORDER — HYDROMORPHONE HCL PF 1 MG/ML IJ SOLN
0.2500 mg | INTRAMUSCULAR | Status: DC | PRN
  Administered 2013-11-22 (×2): 0.5 mg via INTRAVENOUS
  Filled 2013-11-22: qty 1

## 2013-11-22 MED ORDER — POLYETHYLENE GLYCOL 3350 17 G PO PACK
17.0000 g | PACK | Freq: Two times a day (BID) | ORAL | Status: DC
  Administered 2013-11-23 – 2013-11-25 (×3): 17 g via ORAL
  Filled 2013-11-22 (×8): qty 1

## 2013-11-22 MED ORDER — ONDANSETRON HCL 4 MG PO TABS: 4.0000 mg | ORAL_TABLET | Freq: Four times a day (QID) | ORAL | Status: DC | PRN

## 2013-11-22 MED ORDER — ALBUTEROL SULFATE (5 MG/ML) 0.5% IN NEBU: 2.5000 mg | INHALATION_SOLUTION | Freq: Four times a day (QID) | RESPIRATORY_TRACT | Status: DC | PRN

## 2013-11-22 MED ORDER — PROPOFOL 10 MG/ML IV BOLUS
INTRAVENOUS | Status: AC
  Filled 2013-11-22: qty 20

## 2013-11-22 MED ORDER — 0.9 % SODIUM CHLORIDE (POUR BTL) OPTIME
TOPICAL | Status: DC | PRN
  Administered 2013-11-22: 1000 mL

## 2013-11-22 MED ORDER — SUCCINYLCHOLINE CHLORIDE 20 MG/ML IJ SOLN
INTRAMUSCULAR | Status: AC
  Filled 2013-11-22: qty 1

## 2013-11-22 MED ORDER — METHOCARBAMOL 500 MG PO TABS
500.0000 mg | ORAL_TABLET | Freq: Four times a day (QID) | ORAL | Status: DC | PRN
  Administered 2013-11-25 (×2): 500 mg via ORAL
  Filled 2013-11-22 (×2): qty 1

## 2013-11-22 MED ORDER — MENTHOL 3 MG MT LOZG: 1.0000 | LOZENGE | OROMUCOSAL | Status: DC | PRN

## 2013-11-22 MED ORDER — NEOSTIGMINE METHYLSULFATE 1 MG/ML IJ SOLN
INTRAMUSCULAR | Status: DC | PRN
  Administered 2013-11-22: 2 mg via INTRAVENOUS

## 2013-11-22 MED ORDER — METOCLOPRAMIDE HCL 5 MG/ML IJ SOLN: 5.0000 mg | Freq: Three times a day (TID) | INTRAMUSCULAR | Status: DC | PRN

## 2013-11-22 MED ORDER — CEFAZOLIN SODIUM-DEXTROSE 2-3 GM-% IV SOLR
2.0000 g | Freq: Once | INTRAVENOUS | Status: AC
  Administered 2013-11-22: 2 g via INTRAVENOUS
  Filled 2013-11-22: qty 50

## 2013-11-22 MED ORDER — LACTATED RINGERS IV SOLN: INTRAVENOUS | Status: DC

## 2013-11-22 MED ORDER — DEXTROSE 5 % IV SOLN
500.0000 mg | Freq: Four times a day (QID) | INTRAVENOUS | Status: DC | PRN
  Filled 2013-11-22: qty 5

## 2013-11-22 MED ORDER — PHENYLEPHRINE HCL 10 MG/ML IJ SOLN
INTRAMUSCULAR | Status: AC
  Filled 2013-11-22: qty 1

## 2013-11-22 MED ORDER — SODIUM CHLORIDE 0.9 % IJ SOLN: 9.0000 mL | INTRAMUSCULAR | Status: DC | PRN

## 2013-11-22 MED ORDER — DIPHENHYDRAMINE HCL 50 MG/ML IJ SOLN: 12.5000 mg | Freq: Four times a day (QID) | INTRAMUSCULAR | Status: DC | PRN

## 2013-11-22 MED ORDER — HYDROMORPHONE HCL PF 1 MG/ML IJ SOLN
INTRAMUSCULAR | Status: AC
  Filled 2013-11-22: qty 1

## 2013-11-22 MED ORDER — GLYCOPYRROLATE 0.2 MG/ML IJ SOLN
INTRAMUSCULAR | Status: AC
  Filled 2013-11-22: qty 1

## 2013-11-22 MED ORDER — ONDANSETRON HCL 4 MG/2ML IJ SOLN
INTRAMUSCULAR | Status: AC
  Filled 2013-11-22: qty 2

## 2013-11-22 MED ORDER — ENOXAPARIN SODIUM 30 MG/0.3ML ~~LOC~~ SOLN
30.0000 mg | SUBCUTANEOUS | Status: DC
  Administered 2013-11-23 – 2013-11-25 (×3): 30 mg via SUBCUTANEOUS
  Filled 2013-11-22 (×5): qty 0.3

## 2013-11-22 MED ORDER — HYDROMORPHONE HCL PF 1 MG/ML IJ SOLN
0.5000 mg | INTRAMUSCULAR | Status: DC | PRN
  Administered 2013-11-22: 0.5 mg via INTRAVENOUS
  Administered 2013-11-22 – 2013-11-25 (×8): 1 mg via INTRAVENOUS
  Filled 2013-11-22 (×8): qty 1

## 2013-11-22 MED ORDER — FERROUS SULFATE 325 (65 FE) MG PO TABS: 325.0000 mg | ORAL_TABLET | Freq: Three times a day (TID) | ORAL | Status: DC

## 2013-11-22 MED ORDER — NEOSTIGMINE METHYLSULFATE 1 MG/ML IJ SOLN
INTRAMUSCULAR | Status: AC
  Filled 2013-11-22: qty 10

## 2013-11-22 MED ORDER — FENTANYL CITRATE 0.05 MG/ML IJ SOLN
INTRAMUSCULAR | Status: DC | PRN
  Administered 2013-11-22: 100 ug via INTRAVENOUS
  Administered 2013-11-22 (×2): 50 ug via INTRAVENOUS

## 2013-11-22 MED ORDER — GLYCOPYRROLATE 0.2 MG/ML IJ SOLN
INTRAMUSCULAR | Status: DC | PRN
  Administered 2013-11-22: .2 mg via INTRAVENOUS

## 2013-11-22 MED ORDER — DIPHENHYDRAMINE HCL 12.5 MG/5ML PO ELIX: 12.5000 mg | ORAL_SOLUTION | Freq: Four times a day (QID) | ORAL | Status: DC | PRN

## 2013-11-22 MED ORDER — ACETAMINOPHEN 650 MG RE SUPP: 650.0000 mg | Freq: Four times a day (QID) | RECTAL | Status: DC | PRN

## 2013-11-22 MED ORDER — MIDAZOLAM HCL 5 MG/5ML IJ SOLN
INTRAMUSCULAR | Status: DC | PRN
  Administered 2013-11-22: 1 mg via INTRAVENOUS

## 2013-11-22 MED ORDER — PHENYLEPHRINE HCL 10 MG/ML IJ SOLN
INTRAMUSCULAR | Status: DC | PRN
  Administered 2013-11-22 (×3): 200 ug via INTRAVENOUS
  Administered 2013-11-22: 120 ug via INTRAVENOUS

## 2013-11-22 MED ORDER — ONDANSETRON HCL 4 MG/2ML IJ SOLN
INTRAMUSCULAR | Status: DC | PRN
  Administered 2013-11-22: 4 mg via INTRAVENOUS

## 2013-11-22 MED ORDER — LACTATED RINGERS IV SOLN
INTRAVENOUS | Status: DC | PRN
  Administered 2013-11-22: 08:00:00 via INTRAVENOUS

## 2013-11-22 MED ORDER — ROCURONIUM BROMIDE 100 MG/10ML IV SOLN
INTRAVENOUS | Status: DC | PRN
  Administered 2013-11-22: 20 mg via INTRAVENOUS

## 2013-11-22 MED ORDER — METOCLOPRAMIDE HCL 10 MG PO TABS: 5.0000 mg | ORAL_TABLET | Freq: Three times a day (TID) | ORAL | Status: DC | PRN

## 2013-11-22 MED ORDER — LIDOCAINE HCL (CARDIAC) 20 MG/ML IV SOLN
INTRAVENOUS | Status: DC | PRN
  Administered 2013-11-22: 50 mg via INTRAVENOUS

## 2013-11-22 MED ORDER — ACETAMINOPHEN 325 MG PO TABS
650.0000 mg | ORAL_TABLET | Freq: Four times a day (QID) | ORAL | Status: DC | PRN
  Administered 2013-11-24: 650 mg via ORAL
  Filled 2013-11-22: qty 2

## 2013-11-22 MED ORDER — ALUM & MAG HYDROXIDE-SIMETH 200-200-20 MG/5ML PO SUSP: 30.0000 mL | ORAL | Status: DC | PRN

## 2013-11-22 MED ORDER — NALOXONE HCL 0.4 MG/ML IJ SOLN: 0.4000 mg | INTRAMUSCULAR | Status: DC | PRN

## 2013-11-22 MED ORDER — ALBUTEROL SULFATE (5 MG/ML) 0.5% IN NEBU
INHALATION_SOLUTION | RESPIRATORY_TRACT | Status: AC
  Administered 2013-11-22: 2.5 mg
  Filled 2013-11-22: qty 0.5

## 2013-11-22 MED ORDER — HYDROCODONE-ACETAMINOPHEN 5-325 MG PO TABS: 1.0000 | ORAL_TABLET | Freq: Four times a day (QID) | ORAL | Status: DC | PRN

## 2013-11-22 MED ORDER — ROCURONIUM BROMIDE 100 MG/10ML IV SOLN
INTRAVENOUS | Status: AC
  Filled 2013-11-22: qty 1

## 2013-11-22 MED ORDER — MIDAZOLAM HCL 2 MG/2ML IJ SOLN
INTRAMUSCULAR | Status: AC
  Filled 2013-11-22: qty 2

## 2013-11-22 MED ORDER — PHENOL 1.4 % MT LIQD: 1.0000 | OROMUCOSAL | Status: DC | PRN

## 2013-11-22 SURGICAL SUPPLY — 41 items
ADH SKN CLS APL DERMABOND .7 (GAUZE/BANDAGES/DRESSINGS) ×1
BAG ZIPLOCK 12X15 (MISCELLANEOUS) ×2 IMPLANT
BANDAGE GAUZE ELAST BULKY 4 IN (GAUZE/BANDAGES/DRESSINGS) ×2 IMPLANT
BIT DRILL CANN LG 4.3MM (BIT) ×1 IMPLANT
DERMABOND ADVANCED (GAUZE/BANDAGES/DRESSINGS) ×1
DERMABOND ADVANCED .7 DNX12 (GAUZE/BANDAGES/DRESSINGS) ×1 IMPLANT
DRAPE INCISE IOBAN 66X45 STRL (DRAPES) ×2 IMPLANT
DRAPE STERI IOBAN 125X83 (DRAPES) ×2 IMPLANT
DRILL BIT CANN LG 4.3MM (BIT) ×2
DRSG AQUACEL AG ADV 3.5X 4 (GAUZE/BANDAGES/DRESSINGS) ×4 IMPLANT
DRSG AQUACEL AG ADV 3.5X 6 (GAUZE/BANDAGES/DRESSINGS) ×2 IMPLANT
DRSG MEPILEX BORDER 4X12 (GAUZE/BANDAGES/DRESSINGS) ×2 IMPLANT
DRSG PAD ABDOMINAL 8X10 ST (GAUZE/BANDAGES/DRESSINGS) ×4 IMPLANT
DURAPREP 26ML APPLICATOR (WOUND CARE) ×2 IMPLANT
ELECT REM PT RETURN 9FT ADLT (ELECTROSURGICAL) ×2
ELECTRODE REM PT RTRN 9FT ADLT (ELECTROSURGICAL) ×1 IMPLANT
GLOVE BIOGEL PI IND STRL 7.5 (GLOVE) ×1 IMPLANT
GLOVE BIOGEL PI IND STRL 8 (GLOVE) ×1 IMPLANT
GLOVE BIOGEL PI INDICATOR 7.5 (GLOVE) ×1
GLOVE BIOGEL PI INDICATOR 8 (GLOVE) ×1
GLOVE ECLIPSE 8.0 STRL XLNG CF (GLOVE) IMPLANT
GLOVE ORTHO TXT STRL SZ7.5 (GLOVE) ×4 IMPLANT
GLOVE SURG ORTHO 8.0 STRL STRW (GLOVE) ×2 IMPLANT
GOWN BRE IMP PREV XXLGXLNG (GOWN DISPOSABLE) ×4 IMPLANT
GOWN PREVENTION PLUS LG XLONG (DISPOSABLE) ×2 IMPLANT
GUIDEPIN 3.2X17.5 THRD DISP (PIN) ×2 IMPLANT
HIP FRAC NAIL LAG SCR 10.5X100 (Orthopedic Implant) ×1 IMPLANT
KIT BASIN OR (CUSTOM PROCEDURE TRAY) ×2 IMPLANT
MANIFOLD NEPTUNE II (INSTRUMENTS) ×2 IMPLANT
NAIL HIP FRACT 130D 11X180 (Screw) ×2 IMPLANT
PACK GENERAL/GYN (CUSTOM PROCEDURE TRAY) ×2 IMPLANT
POSITIONER SURGICAL ARM (MISCELLANEOUS) ×2 IMPLANT
SCREW BONE CORTICAL 5.0X3 (Screw) ×2 IMPLANT
SCREW CANN THRD AFF 10.5X100 (Orthopedic Implant) ×1 IMPLANT
SPONGE GAUZE 4X4 12PLY (GAUZE/BANDAGES/DRESSINGS) ×2 IMPLANT
STAPLER VISISTAT 35W (STAPLE) ×2 IMPLANT
SUT VIC AB 1 CT1 27 (SUTURE) ×2
SUT VIC AB 1 CT1 27XBRD ANTBC (SUTURE) ×1 IMPLANT
SUT VIC AB 2-0 CT1 27 (SUTURE) ×4
SUT VIC AB 2-0 CT1 27XBRD (SUTURE) ×2 IMPLANT
TOWEL OR 17X26 10 PK STRL BLUE (TOWEL DISPOSABLE) ×4 IMPLANT

## 2013-11-22 NOTE — Progress Notes (Signed)
[facility] PROGRESS NOTE  Alexandra Snyder:[id number] DOB: May 16, 1934 DOA: 11/21/2013 PCP: [name], MD  Assessment/Plan: Right intertrochanteric femur fracture  -Secondary to mechanical fall  -s/p ORIF 11/22/13 -DVT prophylaxis per ortho -pain control  -stool softeners  COPD  -stable  -albuterol prn  Left Breast Cancer  -continue Arimidex  Leukocytosis  -Likely due to acute medical condition  -Check UA with reflex to culture  -improved Anemia--iron deficiency -may have been from recent THA  -Iron saturation 7% -continue iron--pt was not taking it at home  -B12--597, RBC folate -retic--1.8% -FOBT will be positive on iron; monitor for hematochezia  Family Communication:   Sister-in-law at beside Disposition Plan:   SNF when medically stable        Procedures/Studies: Dg Hip Complete Right  11/21/2013   CLINICAL DATA:  Right hip pain after a fall.  EXAM: RIGHT HIP - COMPLETE 2+ VIEW  COMPARISON:  None.  FINDINGS: There is a comminuted appearing fracture of the right hip involving the intertrochanteric and sub trochanteric region. There is medial displacement and impaction of the distal fracture fragment with varus angulation. There is no dislocation of the hip joint. There are prominent degenerative changes in the hip with prominent osteophytes on the acetabular and femoral sides. Incidental note of a previous left hip arthroplasty. The pelvis appears intact.  IMPRESSION: Comminuted fracture of the intertrochanteric and sub trochanteric region of the proximal right femur.   Electronically Signed   By: Burman Nieves M.D.   On: 11/21/2013 05:52   Dg Hip Operative Right  11/22/2013   CLINICAL DATA:  Right hip fracture  EXAM: DG OPERATIVE RIGHT HIP  TECHNIQUE: A single spot fluoroscopic AP image of the right hip is submitted.  COMPARISON:  Answer previous  FINDINGS: 3 intraoperative fluoroscopic spot images document sliding screw and IM rod placement  across the intertrochanteric fracture, fragments in near anatomic alignment. There is a single distal interlocking screw.  IMPRESSION: Internal fixation of right intertrochanteric fracture without apparent complication   Electronically Signed   By: Oley Balm M.D.   On: 11/22/2013 12:42   Dg Femur Right  11/21/2013   CLINICAL DATA:  Severe right hip pain after a fall.  EXAM: RIGHT FEMUR - 2 VIEW  COMPARISON:  None.  FINDINGS: There is an intertrochanteric and subtrochanteric fracture of the proximal right femur with medial displacement and varus angulation of the distal fracture fragments. There is no dislocation of the hip joint. The distal femur appears intact.  IMPRESSION: Intertrochanteric/subtrochanteric fracture of the proximal right femur.   Electronically Signed   By: Burman Nieves M.D.   On: 11/21/2013 05:51   Ct Head Wo Contrast  11/21/2013   CLINICAL DATA:  Fall  EXAM: CT HEAD WITHOUT CONTRAST  TECHNIQUE: Contiguous axial images were obtained from the base of the skull through the vertex without intravenous contrast.  COMPARISON:  None available  FINDINGS: There is mild generalized atrophy. Scattered and confluent hypodensity within the periventricular and deep white matter is most consistent with chronic small vessel ischemic changes.  There is no acute intracranial hemorrhage or infarct. No mass lesion or midline shift. Gray-white matter differentiation is well maintained. Ventricles are normal in size without evidence of hydrocephalus. CSF containing spaces are within normal limits. No extra-axial fluid collection.  The calvarium is intact.  Orbital soft tissues are within normal limits.  The paranasal sinuses and mastoid air cells are well pneumatized and free of fluid.  Scalp soft tissues are unremarkable.  IMPRESSION: 1. No acute intracranial process. 2. Mild atrophy and chronic microvascular ischemic disease.   Electronically Signed   By: Rise Mu M.D.   On: 11/21/2013  06:10   Dg Pelvis Portable  11/18/2013   CLINICAL DATA:  Postoperative total knee left hip replacement  EXAM: PORTABLE PELVIS 1-2 VIEWS  COMPARISON:  June 25, 2013  FINDINGS: Patient is status post total left hip replacement with no malalignment. Air is identified within the soft tissues of the left hip consistent with recent postoperative change. Degenerative joint changes of right hip with narrowed joint space and osteophyte formation are noted.  IMPRESSION: A left hip replacement without malalignment. Postoperative changes within the soft tissues of left hip.   Electronically Signed   By: Sherian Rein M.D.   On: 11/18/2013 17:09   Dg Hip Portable 1 View Left  11/18/2013   CLINICAL DATA:  Postoperative left hip replacement  EXAM: PORTABLE LEFT HIP - 1 VIEW  COMPARISON:  June 25, 2013  FINDINGS: Portable cross-table lateral view of the left hip demonstrate total left hip replacement without malalignment.  IMPRESSION: Portable cross-table lateral view of left hip demonstrating left hip replacement without malalignment.   Electronically Signed   By: Sherian Rein M.D.   On: 11/18/2013 17:11   Dg C-arm 1-60 Min-no Report  11/18/2013   CLINICAL DATA: left hip arthroplasty   C-ARM 1-60 MINUTES  Fluoroscopy was utilized by the requesting physician.  No radiographic  interpretation.          Subjective: Patient is stable after ORIF. Denies any headache, chest pain, shortness breath, nausea, vomiting, diarrhea, abdominal pain.  Objective: Filed Vitals:   11/22/13 1225 11/22/13 1230 11/22/13 1235 11/22/13 1254  BP:  140/77 140/77 157/82  Pulse: 85 87 83 99  Temp:   98.8 F (37.1 C) 98 F (36.7 C)  TempSrc:      Resp: 16 15 16 18   Height:      Weight:      SpO2: 100% 100% 100% 99%    Intake/Output Summary (Last 24 hours) at 11/22/13 1316 Last data filed at 11/22/13 1234  Gross per 24 hour  Intake 3241.25 ml  Output   1500 ml  Net 1741.25 ml   Weight change:  Exam:   General:   Pt is alert, follows commands appropriately, not in acute distress  HEENT: No icterus, No thrush, [place]/AT  Cardiovascular: RRR, S1/S2, no rubs, no gallops  Respiratory: Diminished breath sounds at the bases. No wheezing. Good air movement.  Abdomen: Soft/+BS, non tender, non distended, no guarding  Extremities: No edema, No lymphangitis, No petechiae, No rashes, no synovitis  Data Reviewed: Basic Metabolic Panel:  Recent Labs Lab 11/19/13 0407 11/20/13 0415 11/21/13 0615 11/22/13 0528  NA 135 137 135 137  K 3.7 3.9 3.6 3.7  CL 101 104 101 103  CO2 24 25 19 24   GLUCOSE 176* 137* 116* 128*  BUN 13 12 22 19   CREATININE 0.75 0.68 0.81 0.76  CALCIUM 8.8 9.3 9.4 8.5   Liver Function Tests: No results found for this basename: AST, ALT, ALKPHOS, BILITOT, PROT, ALBUMIN,  in the last 168 hours No results found for this basename: LIPASE, AMYLASE,  in the last 168 hours No results found for this basename: AMMONIA,  in the last 168 hours CBC:  Recent Labs Lab 11/19/13 0407 11/20/13 0415 11/21/13 0615 11/22/13 0528 11/22/13 1217  WBC 10.2 13.8* 15.9* 9.5  --   NEUTROABS  --   --  13.0*  --   --   HGB 9.6* 9.0* 9.4* 7.1* 10.5*  HCT 28.4* 26.8* 27.6* 20.6* 31.1*  MCV 85.0 86.2 86.0 86.6  --   PLT 185 178 190 203  --    Cardiac Enzymes: No results found for this basename: CKTOTAL, CKMB, CKMBINDEX, TROPONINI,  in the last 168 hours BNP: No components found with this basename: POCBNP,  CBG: No results found for this basename: GLUCAP,  in the last 168 hours  No results found for this or any previous visit (from the past 240 hour(s)).   Scheduled Meds: . albuterol  2.5 mg Nebulization Q8H  . anastrozole  1 mg Oral Daily  . atorvastatin  40 mg Oral q1800  .  ceFAZolin (ANCEF) IV  2 g Intravenous Once  . cholecalciferol  1,000 Units Oral BID  . docusate sodium  100 mg Oral BID  . [START ON 11/23/2013] enoxaparin (LOVENOX) injection  30 mg Subcutaneous Q24H  . feeding  supplement (ENSURE COMPLETE)  237 mL Oral BID BM  . ferrous sulfate  325 mg Oral TID PC  . HYDROmorphone      . polyethylene glycol  17 g Oral BID  . sodium chloride       Continuous Infusions: . sodium chloride Stopped (11/22/13 1140)  . lactated ringers    . sodium chloride 0.9 % 1,000 mL with potassium chloride 10 mEq infusion       Jamika Sadek, DO  [facility] Pager [phone]  If 7PM-7AM, please contact night-coverage [email or web address] Password [email or web address] 11/22/2013, 1:16 PM   LOS: 1 day

## 2013-11-22 NOTE — Brief Op Note (Signed)
11/21/2013 - 11/22/2013  10:00 AM  PATIENT:  Alexandra Snyder  79 y.o. female  PRE-OPERATIVE DIAGNOSIS:  right intertrochanteric femur fracture  POST-OPERATIVE DIAGNOSIS:  right intertrochanteric femur fracture  PROCEDURE:  Procedure(s): INTRAMEDULLARY (IM) NAIL FEMORAL (Right), ORIF right hip  SURGEON:  Surgeon(s) and Role:    * Shelda Pal, MD - Primary  PHYSICIAN ASSISTANT: None  ANESTHESIA:   general  EBL:     BLOOD ADMINISTERED:none  DRAINS: none   LOCAL MEDICATIONS USED:  NONE  SPECIMEN:  No Specimen  DISPOSITION OF SPECIMEN:  N/A  COUNTS:  YES  TOURNIQUET:  * No tourniquets in log *  DICTATION: .Other Dictation: Dictation Number [id number]  PLAN OF CARE: Admit to inpatient   PATIENT DISPOSITION:  PACU - hemodynamically stable.   Delay start of Pharmacological VTE agent (>24hrs) due to surgical blood loss or risk of bleeding: no

## 2013-11-22 NOTE — Progress Notes (Signed)
pacu nursing:  Pt surgery postponed until 9:30.  Brought to PACU for holding until surgery time.  Pt w/o complaints.  Sister in law at bedside.  Floor RN's aware of later surgery time.

## 2013-11-22 NOTE — Op Note (Signed)
NAMESHATONIA, HOOTS NO.:  [id number]  MEDICAL RECORD NO.:  [id number]  LOCATION:  1540                         FACILITY:  [facility]  PHYSICIAN:  Madlyn Frankel. Charlann Boxer, M.D.  DATE OF BIRTH:  [birth year]  DATE OF PROCEDURE:  11/22/2013 DATE OF DISCHARGE:                              OPERATIVE REPORT   PREOPERATIVE DIAGNOSIS:  Comminuted right intertrochanteric femur fracture.  POSTOPERATIVE DIAGNOSIS:  Comminuted right intertrochanteric femur fracture.  PROCEDURE:  Open reduction and internal fixation of right peritrochanteric fracture utilizing a [employer] AFFIXUS nail 11 x 180 mm, 130 degree lag screw and distal interlock.  SURGEON:  Madlyn Frankel. Charlann Boxer, M.D.  ASSISTANT:  Surgical team.  ANESTHESIA:  General.  SPECIMENS:  None.  COMPLICATIONS:  None.  DRAINS:  None.  ESTIMATED BLOOD LOSS:  Less than 50 mL.  INDICATION FOR PROCEDURE:  Ms. Buendia is a pleasant 79 year old female, who just this past week underwent a left total hip arthroplasty for which she done very well in the hospital.  She went home and unfortunately the morning of admission on the 28th, she was shuffling between furniture when her walker got caught up on the carpet and floor junction caused her to lose her balance, falling onto her right side. She had immediate onset of pain, inability to bear weight, was brought to the emergency room where fracture was identified radiographically. She was subsequently admitted to the medical service, and Orthopedics consulted particularly myself based on my relationship with her.  She was noted to have a hemoglobin of 9+ at the time of admission, however, on the day of procedure, was noted to have a drop, equilibrated back down to 7 with hydration.  She was subsequently given 2 units of blood in the operating room.  After reviewing risks, benefits, and necessity of the procedure, consent was obtained for benefit of fracture care management.  PROCEDURE IN  DETAIL:  The patient was brought to operative theater. Once adequate anesthesia, preoperative antibiotics, Ancef administered, the patient was positioned supine on the fracture table.  Her left unaffected extremity at this point was flexed and carefully abducted with bony prominences padded, particularly over the perineal nerve laterally.  The right lower extremity was then placed in a traction boot.  Fluoroscopy was brought to the field and traction internal rotation applied to best aligned the fracture in AP and lateral planes.  She was noted to have a comminuted transverse peritrochanteric fracture pattern.  At this point, the right hip was prepped and draped from the iliac crest to the knee with a shower curtain technique.  Time-out was performed, identifying the patient, planned procedure, and extremity.  Fluoroscopy was then brought back to the field identifying the landmarks at the fracture site as well as the tip of the trochanter.  The lateral incision was made proximal trochanter.  Guidewire was then inserted into the apex of the trochanter at posterior aspect to help reduce the trochanteric segment back to itself.  The guidewire was inserted into the proximal femur, confirmed radiographically.  The proximal femur was then opened with a drill.  The 11 x 180 mm AFFIXUS nail attached to the insertion jig was then  passed by hand to its appropriate depth.  At this point, there was still some medial displacement of the shaft in relation to the trochanteric segment.  This extended the incision from where I would place the lag screw, so as to place a reduction hook on the medial aspect of the femur just distal to the lesser trochanter. Utilizing this, I was able to maintain reduction, as I then placed the guidewire into the center of the head in AP and lateral planes, drilled and placed a 90 mm lag screw.  I chose to lock this down as a fixed angle device due to the transverse  nature.  Once this was locked down, the hook was removed, fracture was maintained.  Off the jig, I placed a distal interlock screw measuring 34 mm.  At this point, final radiographs were obtained in AP and lateral planes. Wounds were irrigated with normal saline solution.  I then reapproximated the iliotibial band, distal, and gluteal fascia proximally using #1 Vicryl.  The remaining wound was closed with 2-0 Vicryl, then running 4-0 Monocryl.  The hip was then cleaned, dried, and dressed sterilely, Dermabond applied and long Mepilex applied over both wounds.  She was then brought to recovery room, extubated in stable condition, tolerating the procedure well.  I am going to make her partial weightbearing in this right lower extremity due to wound healing and bone quality is identified from the intraoperative experience from a left hip as well as her obvious osteoporotic nature based on this fall here.  Findings will be reviewed with family.     Madlyn Frankel Charlann Boxer, M.D.     MDO/MEDQ  D:  11/22/2013  T:  11/22/2013  Job:  [id number]

## 2013-11-22 NOTE — Anesthesia Postprocedure Evaluation (Signed)
  Anesthesia Post-op Note  Patient: Alexandra Snyder  Procedure(s) Performed: Procedure(s) (LRB): INTRAMEDULLARY (IM) NAIL FEMORAL (Right)  Patient Location: PACU  Anesthesia Type: General  Level of Consciousness: awake and alert   Airway and Oxygen Therapy: Patient Spontanous Breathing  Post-op Pain: mild  Post-op Assessment: Post-op Vital signs reviewed, Patient's Cardiovascular Status Stable, Respiratory Function Stable, Patent Airway and No signs of Nausea or vomiting  Last Vitals:  Filed Vitals:   11/22/13 1139  BP:   Pulse:   Temp: 37.1 C  Resp: 20    Post-op Vital Signs: stable   Complications: No apparent anesthesia complications

## 2013-11-22 NOTE — Progress Notes (Signed)
Patient ID: Alexandra Snyder, female   DOB: Mar 10, 1934, 79 y.o.   MRN: [id number]  Right intertrochanteric femur fracture hgb 7.1 this am  It was decided to postpone surgery this am until type and cross setup and blood ready  Otherwise, to OR today for ORIF NPO

## 2013-11-22 NOTE — Transfer of Care (Signed)
Immediate Anesthesia Transfer of Care Note  Patient: Alexandra Snyder  Procedure(s) Performed: Procedure(s): INTRAMEDULLARY (IM) NAIL FEMORAL (Right)  Patient Location: PACU  Anesthesia Type:General  Level of Consciousness: awake, alert , oriented and patient cooperative  Airway & Oxygen Therapy: Patient Spontanous Breathing and Patient connected to face mask oxygen  Post-op Assessment: Report given to PACU RN, Post -op Vital signs reviewed and stable and Patient moving all extremities X 4  Post vital signs: stable  Complications: No apparent anesthesia complications

## 2013-11-23 DIAGNOSIS — IMO0002 Reserved for concepts with insufficient information to code with codable children: Secondary | ICD-10-CM

## 2013-11-23 DIAGNOSIS — D509 Iron deficiency anemia, unspecified: Secondary | ICD-10-CM

## 2013-11-23 LAB — BASIC METABOLIC PANEL
BUN: 9 mg/dL (ref 6–23)
CO2: 23 mEq/L (ref 19–32)
Calcium: 8.4 mg/dL (ref 8.4–10.5)
Creatinine, Ser: 0.62 mg/dL (ref 0.50–1.10)
GFR calc non Af Amer: 85 mL/min — ABNORMAL LOW (ref >90)
Glucose, Bld: 124 mg/dL — ABNORMAL HIGH (ref 70–99)
Potassium: 3.6 mEq/L (ref 3.5–5.1)

## 2013-11-23 LAB — TYPE AND SCREEN
ABO/RH(D): O POS
Unit division: 0

## 2013-11-23 LAB — CBC
HCT: 26.9 % — ABNORMAL LOW (ref 36.0–46.0)
Hemoglobin: 9.3 g/dL — ABNORMAL LOW (ref 12.0–15.0)
MCH: 29.4 pg (ref 26.0–34.0)
MCHC: 34.6 g/dL (ref 30.0–36.0)
MCV: 85.1 fL (ref 78.0–100.0)
WBC: 9.8 10*3/uL (ref 4.0–10.5)

## 2013-11-23 MED ORDER — ALBUTEROL SULFATE (5 MG/ML) 0.5% IN NEBU
2.5000 mg | INHALATION_SOLUTION | Freq: Three times a day (TID) | RESPIRATORY_TRACT | Status: DC
  Administered 2013-11-23 – 2013-11-25 (×8): 2.5 mg via RESPIRATORY_TRACT
  Filled 2013-11-23 (×8): qty 0.5

## 2013-11-23 NOTE — Progress Notes (Signed)
   CARE MANAGEMENT NOTE 11/23/2013  Patient:  Alexandra Snyder, Alexandra Snyder   Account Number:  [id number]  Date Initiated:  11/22/2013  Documentation initiated by:  [facility]  Subjective/Objective Assessment:   Right intertrochanteric femur fracture     Action/Plan:   waiting PT/OT evaluation   Anticipated DC Date:     Anticipated DC Plan:  HOME W HOME HEALTH SERVICES      DC Planning Services  CM consult      Choice offered to / List presented to:             Status of service:  In process, will continue to follow Medicare Important Message given?   (If response is "NO", the following Medicare IM given date fields will be blank) Date Medicare IM given:   Date Additional Medicare IM given:    Discharge Disposition:    Per UR Regulation:    If discussed at Long Length of Stay Meetings, dates discussed:    Comments:  11/23/2013 1600 Pt is active with [place]. Isidoro Donning RN CCM Case Mgmt phone [phone]

## 2013-11-23 NOTE — Evaluation (Signed)
Occupational Therapy Evaluation Patient Details Name: Alexandra Snyder MRN: [id number] DOB: 1934-11-15 Today's Date: 11/23/2013 Time: 1191-4782 OT Time Calculation (min): 28 min  OT Assessment / Plan / Recommendation History of present illness 79 yo female s/p ORIF R femur 11/29. Hx of L THA-direct anterior 11/25.    Clinical Impression   Pt admitted with above. She demonstrates the below listed deficits and will benefit from continued OT to maximize safety and independence with BADLs.  Recommend SNF at discharge.     OT Assessment  Patient needs continued OT Services    Follow Up Recommendations  SNF    Barriers to Discharge Decreased caregiver support    Equipment Recommendations  None recommended by OT    Recommendations for Other Services    Frequency  Min 2X/week    Precautions / Restrictions Precautions Precautions: Fall Restrictions Weight Bearing Restrictions: Yes RLE Weight Bearing: Partial weight bearing RLE Partial Weight Bearing Percentage or Pounds: 50 LLE Weight Bearing: Partial weight bearing LLE Partial Weight Bearing Percentage or Pounds: 50%   Pertinent Vitals/Pain     ADL  Eating/Feeding: Independent Where Assessed - Eating/Feeding: Chair Grooming: Wash/dry face;Wash/dry hands;Teeth care;Brushing hair Where Assessed - Grooming: Supported sitting Upper Body Bathing: Set up Where Assessed - Upper Body Bathing: Supported sitting Lower Body Bathing: Maximal assistance Where Assessed - Lower Body Bathing: Supported sit to stand Upper Body Dressing: Set up;Supervision/safety Where Assessed - Upper Body Dressing: Unsupported sitting;Supported sitting Lower Body Dressing: +1 Total assistance Where Assessed - Lower Body Dressing: Supported sit to Pharmacist, hospital: +2 Total assistance Toilet Transfer: Patient Percentage: 40% Statistician Method: Sit to Barista: Materials engineer and Hygiene:  +1 Total assistance Where Assessed - Engineer, mining and Hygiene: Standing Equipment Used: Rolling walker Transfers/Ambulation Related to ADLs: Transfer to chair with total A +2 Pt ~60% ADL Comments: `    OT Diagnosis: Generalized weakness;Acute pain  OT Problem List: Decreased strength;Decreased activity tolerance;Decreased knowledge of use of DME or AE;Decreased knowledge of precautions;Pain OT Treatment Interventions: Self-care/ADL training;Therapeutic activities;Patient/family education;DME and/or AE instruction   OT Goals(Current goals can be found in the care plan section) Acute Rehab OT Goals Patient Stated Goal: To get back on my feet OT Goal Formulation: With patient Time For Goal Achievement: 12/03/13 Potential to Achieve Goals: Good ADL Goals Pt Will Perform Lower Body Bathing: with mod assist;sit to/from stand;sitting/lateral leans Pt Will Perform Lower Body Dressing: with mod assist;with adaptive equipment;sit to/from stand;sitting/lateral leans Pt Will Transfer to Toilet: with mod assist;bedside commode;stand pivot transfer Pt Will Perform Toileting - Clothing Manipulation and hygiene: with mod assist;sitting/lateral leans;with adaptive equipment;sit to/from stand  Visit Information  Last OT Received On: 11/23/13 Assistance Needed: +2 History of Present Illness: 79 yo female s/p ORIF R femur 11/29. Hx of L THA-direct anterior 11/25.        Prior Functioning     Home Living Family/patient expects to be discharged to:: Skilled nursing facility Prior Function Level of Independence: Independent with assistive device(s) Comments: Pt reports she was performing BADLs with AE.  Her family was assisting with IADLs Communication Communication: No difficulties Dominant Hand: Right         Vision/Perception     Cognition  Cognition Arousal/Alertness: Awake/alert Behavior During Therapy: WFL for tasks assessed/performed Overall Cognitive Status:  Within Functional Limits for tasks assessed    Extremity/Trunk Assessment Upper Extremity Assessment Upper Extremity Assessment: Generalized weakness Lower Extremity Assessment Lower Extremity Assessment: Defer  to PT evaluation RLE: Unable to fully assess due to pain LLE Deficits / Details: hip strength 2/5.  Cervical / Trunk Assessment Cervical / Trunk Assessment: Normal     Mobility Bed Mobility Bed Mobility: Supine to Sit;Sitting - Scoot to Edge of Bed Supine to Sit: 1: +2 Total assist;HOB elevated;With rails Supine to Sit: Patient Percentage: 50% Sitting - Scoot to Edge of Bed: 1: +2 Total assist Sitting - Scoot to Edge of Bed: Patient Percentage: 10% Sit to Supine: 1: +2 Total assist Sit to Supine: Patient Percentage: 10% Details for Bed Mobility Assistance: Assist to move LEs to EOB and assit to lift shoulders - HOB elevated Transfers Transfers: Sit to Stand;Stand to Sit Sit to Stand: 1: +2 Total assist;From elevated surface Sit to Stand: Patient Percentage: 40% Stand to Sit: 1: +2 Total assist;With armrests Stand to Sit: Patient Percentage: 40% Details for Transfer Assistance: Assist to rise, stabilize, control descent. Multimodal cueing for safety, posture, technique, use of UEs to aid with WBing/adherence to PWB status during pivot.      Exercise     Balance Balance Balance Assessed: Yes Static Standing Balance Static Standing - Level of Assistance: 1: +2 Total assist (60%) Dynamic Standing Balance Dynamic Standing - Level of Assistance: 1: +2 Total assist   End of Session OT - End of Session Equipment Utilized During Treatment: Rolling walker Activity Tolerance: Patient tolerated treatment well Patient left: in chair;with call bell/phone within reach Nurse Communication: Mobility status  GO     Thaddeus Evitts M 11/23/2013, 12:47 PM

## 2013-11-23 NOTE — Progress Notes (Signed)
Clinical Social Work Department BRIEF PSYCHOSOCIAL ASSESSMENT 11/23/2013  Patient:  Alexandra Snyder, Alexandra Snyder     Account Number:  [id number]     Admit date:  11/21/2013  Clinical Social Worker:  Doroteo Glassman  Date/Time:  11/23/2013 01:56 PM  Referred by:  Physician  Date Referred:  11/23/2013 Referred for  SNF Placement   Other Referral:   Interview type:  Patient Other interview type:    PSYCHOSOCIAL DATA Living Status:  ALONE Admitted from facility:   Level of care:   Primary support name:  Melburn Popper Primary support relationship to patient:  FAMILY Degree of support available:   strong    CURRENT CONCERNS Current Concerns  Post-Acute Placement   Other Concerns:    SOCIAL WORK ASSESSMENT / PLAN Met with Pt to discuss d/c plans.    Pt is agreeable to the SNF recommendation and gave CSW permission to send her information to [facility].    CSW provided Pt with a SNF list.    CSW thanked Pt for her time.   Assessment/plan status:  Psychosocial Support/Ongoing Assessment of Needs Other assessment/ plan:   Information/referral to community resources:   SNF list    PATIENT'S/FAMILY'S RESPONSE TO PLAN OF CARE: Pt was agreeable to SNF and stated, "I know I need to go. I'd like to go to the one off of [address]    CSW reviewed SNF list with Pt and discussed SNF process. Pt voiced an understanding.  She and her family are hopeful for [facility].    Pt was cooperative and pleasant.    Pt thanked CSW for time and assistance.   Providence Crosby, LCSWA Clinical Social Work [phone]

## 2013-11-23 NOTE — Progress Notes (Signed)
   Subjective: 1 Day Post-Op Procedure(s) (LRB): INTRAMEDULLARY (IM) NAIL FEMORAL (Right)  Pt doing well this morning Mild soreness to bilateral hips this morning Patient reports pain as mild.  Objective:   VITALS:   Filed Vitals:   11/23/13 0618  BP: 130/76  Pulse: 95  Temp: 98.9 F (37.2 C)  Resp: 18    Bilateral hip incisions healing well nv intact distally No rashes or edema   LABS  Recent Labs  11/21/13 0615 11/22/13 0528 11/22/13 1217 11/23/13 0530  HGB 9.4* 7.1* 10.5* 9.3*  HCT 27.6* 20.6* 31.1* 26.9*  WBC 15.9* 9.5  --  9.8  PLT 190 203  --  169     Recent Labs  11/21/13 0615 11/22/13 0528 11/23/13 0530  NA 135 137 136  K 3.6 3.7 3.6  BUN 22 19 9   CREATININE 0.81 0.76 0.62  GLUCOSE 116* 128* 124*     Assessment/Plan: 1 Day Post-Op Procedure(s) (LRB): INTRAMEDULLARY (IM) NAIL FEMORAL (Right)  PT/OT with care for left total hip arthroplasty 1 week ago and right femur IM nail yesterday Pain control Pulmonary toilet Pt in good spirits today   Alphonsa Overall, MPAS, PA-C  11/23/2013, 7:58 AM

## 2013-11-23 NOTE — Evaluation (Signed)
Physical Therapy Evaluation Patient Details Name: Alexandra Snyder MRN: [id number] DOB: [birth year] Today's Date: 11/23/2013 Time: 4098-1191 PT Time Calculation (min): 28 min  PT Assessment / Plan / Recommendation History of Present Illness  79 yo female s/p ORIF R femur 11/29. Hx of L THA-direct anterior 11/25.   Clinical Impression  On eval, pt required +2 assist for bed mobility and stand pivot transfer with walker.  Recommend SNF for rehab.     PT Assessment  Patient needs continued PT services    Follow Up Recommendations  SNF    Does the patient have the potential to tolerate intense rehabilitation      Barriers to Discharge        Equipment Recommendations  None recommended by PT    Recommendations for Other Services OT consult   Frequency Min 3X/week    Precautions / Restrictions Precautions Precautions: Fall Restrictions Weight Bearing Restrictions: Yes RLE Weight Bearing: Partial weight bearing RLE Partial Weight Bearing Percentage or Pounds: 50 LLE Weight Bearing: Partial weight bearing LLE Partial Weight Bearing Percentage or Pounds: 50%   Pertinent Vitals/Pain 6/10 R LE, 5/10 L LE with activity. Pt denies pain at rest.       Mobility  Bed Mobility Bed Mobility: Supine to Sit;Sitting - Scoot to Edge of Bed Supine to Sit: 1: +2 Total assist;HOB elevated;With rails Supine to Sit: Patient Percentage: 50% Sit to Supine: 1: +2 Total assist Sit to Supine: Patient Percentage: 10% Details for Bed Mobility Assistance: Increased time. Assist for bil LEs and trunk. Utilized bedpad for scooting, positioning Transfers Transfers: Sit to Stand;Stand to [employer] Transfers Sit to Stand: 1: +2 Total assist;From elevated surface Sit to Stand: Patient Percentage: 40% Stand to Sit: 1: +2 Total assist;With armrests Stand to Sit: Patient Percentage: 40% Stand Pivot Transfers: 1: +2 Total assist;From elevated surface Stand Pivot Transfers: Patient Percentage:  60% Details for Transfer Assistance: Assist to rise, stabilize, control descent. Multimodal cueing for safety, posture, technique, use of UEs to aid with WBing/adherence to PWB status during pivot.  Ambulation/Gait Ambulation/Gait Assistance: Not tested (comment) Ambulation/Gait Assistance Details: Pt unable to ambulate at current WB status    Exercises     PT Diagnosis: Abnormality of gait;Generalized weakness;Acute pain;Difficulty walking  PT Problem List: Decreased strength;Decreased range of motion;Decreased activity tolerance;Decreased balance;Decreased mobility;Pain;Decreased knowledge of use of DME PT Treatment Interventions: DME instruction;Stair training;Functional mobility training;Therapeutic activities;Therapeutic exercise;Patient/family education;Balance training     PT Goals(Current goals can be found in the care plan section) Acute Rehab PT Goals Patient Stated Goal: get better.  PT Goal Formulation: With patient Time For Goal Achievement: 12/07/13 Potential to Achieve Goals: Good  Visit Information  Last PT Received On: 11/23/13 Assistance Needed: +2 History of Present Illness: 79 yo female s/p ORIF R femur 11/29. Hx of L THA-direct anterior 11/25.        Prior Functioning  Home Living Family/patient expects to be discharged to:: Skilled nursing facility    Cognition  Cognition Arousal/Alertness: Awake/alert Behavior During Therapy: WFL for tasks assessed/performed Overall Cognitive Status: Within Functional Limits for tasks assessed    Extremity/Trunk Assessment Upper Extremity Assessment Upper Extremity Assessment: Generalized weakness Lower Extremity Assessment Lower Extremity Assessment: RLE deficits/detail RLE: Unable to fully assess due to pain LLE Deficits / Details: hip strength 2/5.  Cervical / Trunk Assessment Cervical / Trunk Assessment: Normal   Balance Balance Balance Assessed: Yes Static Standing Balance Static Standing - Level of  Assistance: 1: +2 Total assist Dynamic Standing  Balance Dynamic Standing - Level of Assistance: 1: +2 Total assist  End of Session PT - End of Session Equipment Utilized During Treatment: Gait belt Activity Tolerance: Patient limited by fatigue;Patient limited by pain Patient left: in chair;with call bell/phone within reach Nurse Communication:  (asked nursing to page PT for assistance back to bed)  GP     Rebeca Alert, MPT Pager: [phone]

## 2013-11-23 NOTE — Progress Notes (Signed)
Clinical Social Work Department CLINICAL SOCIAL WORK PLACEMENT NOTE 11/23/2013  Patient:  Alexandra Snyder, Alexandra Snyder  Account Number:  [id number] Admit date:  11/21/2013  Clinical Social Worker:  Doroteo Glassman  Date/time:  11/23/2013 02:01 PM  Clinical Social Work is seeking post-discharge placement for this patient at the following level of care:   SKILLED NURSING   (*CSW will update this form in Epic as items are completed)   11/23/2013  Patient/family provided with [facility] Department of Clinical Social Work's list of facilities offering this level of care within the geographic area requested by the patient (or if unable, by the patient's family).  11/23/2013  Patient/family informed of their freedom to choose among providers that offer the needed level of care, that participate in Medicare, Medicaid or managed care program needed by the patient, have an available bed and are willing to accept the patient.  11/23/2013  Patient/family informed of [facility]' ownership interest in [facility], as well as of the fact that they are under no obligation to receive care at this facility.  PASARR submitted to EDS on 11/23/2013 PASARR number received from EDS on 11/23/2013  FL2 transmitted to all facilities in geographic area requested by pt/family on  11/23/2013 FL2 transmitted to all facilities within larger geographic area on   Patient informed that his/her managed care company has contracts with or will negotiate with  certain facilities, including the following:     Patient/family informed of bed offers received:   Patient chooses bed at  Physician recommends and patient chooses bed at    Patient to be transferred to  on   Patient to be transferred to facility by   The following physician request were entered in Epic:   Additional Comments:  Providence Crosby, Theresia Majors Clinical Social Work [phone]

## 2013-11-23 NOTE — Progress Notes (Signed)
[facility] PROGRESS NOTE  Alexandra Snyder ZOX:[id number] DOB: 18-Jun-1934 DOA: 11/21/2013 PCP: [name], MD  Assessment/Plan: Right intertrochanteric femur fracture  -Secondary to mechanical fall  -s/p ORIF 11/22/13  -DVT prophylaxis per ortho ??restart Xarelto -pain control  -stool softeners  -s/p R-THA on 11/18/13 COPD  -stable  -continue albuterol neb Left Breast Cancer  -continue Arimidex  Leukocytosis  -Likely due to acute medical condition  -UA neg for pyuria -improved  Anemia--iron deficiency  -may have been from recent THA  -Iron saturation 7%  -continue iron--pt was not taking it at home  -B12--597, RBC folate  -retic--1.8%  -FOBT will be positive on iron; monitor for hematochezia  Tachycardia -Likely due to acute pain -EKG shows sinus tachycardia, negative for ST-T wave changes. -TSH -partly due to nebs Family Communication: Sister-in-law at beside  Disposition Plan: SNF when medically stable          Procedures/Studies: Dg Hip Complete Right  11/21/2013   CLINICAL DATA:  Right hip pain after a fall.  EXAM: RIGHT HIP - COMPLETE 2+ VIEW  COMPARISON:  None.  FINDINGS: There is a comminuted appearing fracture of the right hip involving the intertrochanteric and sub trochanteric region. There is medial displacement and impaction of the distal fracture fragment with varus angulation. There is no dislocation of the hip joint. There are prominent degenerative changes in the hip with prominent osteophytes on the acetabular and femoral sides. Incidental note of a previous left hip arthroplasty. The pelvis appears intact.  IMPRESSION: Comminuted fracture of the intertrochanteric and sub trochanteric region of the proximal right femur.   Electronically Signed   By: Burman Nieves M.D.   On: 11/21/2013 05:52   Dg Hip Operative Right  11/22/2013   CLINICAL DATA:  Right hip fracture  EXAM: DG OPERATIVE RIGHT HIP  TECHNIQUE: A single spot fluoroscopic  AP image of the right hip is submitted.  COMPARISON:  Answer previous  FINDINGS: 3 intraoperative fluoroscopic spot images document sliding screw and IM rod placement across the intertrochanteric fracture, fragments in near anatomic alignment. There is a single distal interlocking screw.  IMPRESSION: Internal fixation of right intertrochanteric fracture without apparent complication   Electronically Signed   By: Oley Balm M.D.   On: 11/22/2013 12:42   Dg Femur Right  11/21/2013   CLINICAL DATA:  Severe right hip pain after a fall.  EXAM: RIGHT FEMUR - 2 VIEW  COMPARISON:  None.  FINDINGS: There is an intertrochanteric and subtrochanteric fracture of the proximal right femur with medial displacement and varus angulation of the distal fracture fragments. There is no dislocation of the hip joint. The distal femur appears intact.  IMPRESSION: Intertrochanteric/subtrochanteric fracture of the proximal right femur.   Electronically Signed   By: Burman Nieves M.D.   On: 11/21/2013 05:51   Ct Head Wo Contrast  11/21/2013   CLINICAL DATA:  Fall  EXAM: CT HEAD WITHOUT CONTRAST  TECHNIQUE: Contiguous axial images were obtained from the base of the skull through the vertex without intravenous contrast.  COMPARISON:  None available  FINDINGS: There is mild generalized atrophy. Scattered and confluent hypodensity within the periventricular and deep white matter is most consistent with chronic small vessel ischemic changes.  There is no acute intracranial hemorrhage or infarct. No mass lesion or midline shift. Gray-white matter differentiation is well maintained. Ventricles are normal in size without evidence of hydrocephalus. CSF containing spaces are within normal limits. No extra-axial fluid collection.  The calvarium is intact.  Orbital soft tissues are within normal limits.  The paranasal sinuses and mastoid air cells are well pneumatized and free of fluid.  Scalp soft tissues are unremarkable.  IMPRESSION:  1. No acute intracranial process. 2. Mild atrophy and chronic microvascular ischemic disease.   Electronically Signed   By: Rise Mu M.D.   On: 11/21/2013 06:10   Dg Pelvis Portable  11/18/2013   CLINICAL DATA:  Postoperative total knee left hip replacement  EXAM: PORTABLE PELVIS 1-2 VIEWS  COMPARISON:  June 25, 2013  FINDINGS: Patient is status post total left hip replacement with no malalignment. Air is identified within the soft tissues of the left hip consistent with recent postoperative change. Degenerative joint changes of right hip with narrowed joint space and osteophyte formation are noted.  IMPRESSION: A left hip replacement without malalignment. Postoperative changes within the soft tissues of left hip.   Electronically Signed   By: Sherian Rein M.D.   On: 11/18/2013 17:09   Dg Hip Portable 1 View Left  11/18/2013   CLINICAL DATA:  Postoperative left hip replacement  EXAM: PORTABLE LEFT HIP - 1 VIEW  COMPARISON:  June 25, 2013  FINDINGS: Portable cross-table lateral view of the left hip demonstrate total left hip replacement without malalignment.  IMPRESSION: Portable cross-table lateral view of left hip demonstrating left hip replacement without malalignment.   Electronically Signed   By: Sherian Rein M.D.   On: 11/18/2013 17:11   Dg C-arm 1-60 Min-no Report  11/18/2013   CLINICAL DATA: left hip arthroplasty   C-ARM 1-60 MINUTES  Fluoroscopy was utilized by the requesting physician.  No radiographic  interpretation.          Subjective: Patient is in good spirits today. She still complains of pain in the right hip area. Denies fevers, chills, chest pain, shortness breath, nausea, vomiting, diarrhea, abdominal pain. No dysuria.  Objective: Filed Vitals:   11/23/13 1000 11/23/13 1200 11/23/13 1510 11/23/13 1600  BP: 123/78  128/83   Pulse: 108  104   Temp: 98.2 F (36.8 C)  99.3 F (37.4 C)   TempSrc: Oral  Oral   Resp: 16 18 18 16   Height:      Weight:       SpO2: 95% 100% 98% 97%    Intake/Output Summary (Last 24 hours) at 11/23/13 1655 Last data filed at 11/23/13 1000  Gross per 24 hour  Intake 1966.25 ml  Output   1250 ml  Net 716.25 ml   Weight change:  Exam:   General:  Pt is alert, follows commands appropriately, not in acute distress  HEENT: No icterus, No thrush,Davy/AT  Cardiovascular: RRR, S1/S2, no rubs, no gallops  Respiratory: Diminished breath sounds at the bases but clear to auscultation. No wheezing.  Abdomen: Soft/+BS, non tender, non distended, no guarding  Extremities: trace LE edema, No lymphangitis, No petechiae, No rashes, no synovitis  Data Reviewed: Basic Metabolic Panel:  Recent Labs Lab 11/19/13 0407 11/20/13 0415 11/21/13 0615 11/22/13 0528 11/23/13 0530  NA 135 137 135 137 136  K 3.7 3.9 3.6 3.7 3.6  CL 101 104 101 103 102  CO2 24 25 19 24 23   GLUCOSE 176* 137* 116* 128* 124*  BUN 13 12 22 19 9   CREATININE 0.75 0.68 0.81 0.76 0.62  CALCIUM 8.8 9.3 9.4 8.5 8.4   Liver Function Tests: No results found for this basename: AST, ALT, ALKPHOS, BILITOT, PROT, ALBUMIN,  in the last 168 hours No results found for  this basename: LIPASE, AMYLASE,  in the last 168 hours No results found for this basename: AMMONIA,  in the last 168 hours CBC:  Recent Labs Lab 11/19/13 0407 11/20/13 0415 11/21/13 0615 11/22/13 0528 11/22/13 1217 11/23/13 0530  WBC 10.2 13.8* 15.9* 9.5  --  9.8  NEUTROABS  --   --  13.0*  --   --   --   HGB 9.6* 9.0* 9.4* 7.1* 10.5* 9.3*  HCT 28.4* 26.8* 27.6* 20.6* 31.1* 26.9*  MCV 85.0 86.2 86.0 86.6  --  85.1  PLT 185 178 190 203  --  169   Cardiac Enzymes: No results found for this basename: CKTOTAL, CKMB, CKMBINDEX, TROPONINI,  in the last 168 hours BNP: No components found with this basename: POCBNP,  CBG: No results found for this basename: GLUCAP,  in the last 168 hours  No results found for this or any previous visit (from the past 240 hour(s)).   Scheduled  Meds: . albuterol  2.5 mg Nebulization TID  . anastrozole  1 mg Oral Daily  . atorvastatin  40 mg Oral q1800  . cholecalciferol  1,000 Units Oral BID  . docusate sodium  100 mg Oral BID  . enoxaparin (LOVENOX) injection  30 mg Subcutaneous Q24H  . feeding supplement (ENSURE COMPLETE)  237 mL Oral BID BM  . ferrous sulfate  325 mg Oral TID PC  . polyethylene glycol  17 g Oral BID   Continuous Infusions: . sodium chloride Stopped (11/22/13 1140)  . lactated ringers    . sodium chloride 0.9 % 1,000 mL with potassium chloride 10 mEq infusion 75 mL/hr at 11/22/13 1535     Latanya Hemmer, DO  [facility] Pager [phone]  If 7PM-7AM, please contact night-coverage [email or web address] Password [email or web address] 11/23/2013, 4:55 PM   LOS: 2 days

## 2013-11-24 ENCOUNTER — Encounter (HOSPITAL_COMMUNITY): Payer: Self-pay | Admitting: Orthopedic Surgery

## 2013-11-24 DIAGNOSIS — Z96649 Presence of unspecified artificial hip joint: Secondary | ICD-10-CM

## 2013-11-24 LAB — BASIC METABOLIC PANEL
BUN: 11 mg/dL (ref 6–23)
Calcium: 8.6 mg/dL (ref 8.4–10.5)
GFR calc non Af Amer: 82 mL/min — ABNORMAL LOW (ref >90)
Glucose, Bld: 112 mg/dL — ABNORMAL HIGH (ref 70–99)
Sodium: 136 mEq/L (ref 135–145)

## 2013-11-24 LAB — URINALYSIS W MICROSCOPIC + REFLEX CULTURE
Bilirubin Urine: NEGATIVE
Glucose, UA: NEGATIVE mg/dL
Ketones, ur: NEGATIVE mg/dL
Protein, ur: NEGATIVE mg/dL
Urobilinogen, UA: 2 mg/dL — ABNORMAL HIGH (ref 0.0–1.0)

## 2013-11-24 LAB — T4, FREE: Free T4: 1.55 ng/dL (ref 0.80–1.80)

## 2013-11-24 LAB — CBC
Hemoglobin: 8.5 g/dL — ABNORMAL LOW (ref 12.0–15.0)
MCH: 29.2 pg (ref 26.0–34.0)
MCHC: 33.7 g/dL (ref 30.0–36.0)
Platelets: 195 10*3/uL (ref 150–400)
RDW: 14.6 % (ref 11.5–15.5)

## 2013-11-24 LAB — TSH: TSH: 0.013 u[IU]/mL — ABNORMAL LOW (ref 0.350–4.500)

## 2013-11-24 MED ORDER — HYDROCODONE-ACETAMINOPHEN 5-325 MG PO TABS: 1.0000 | ORAL_TABLET | Freq: Four times a day (QID) | ORAL | Status: DC | PRN

## 2013-11-24 NOTE — Social Work (Signed)
SNF bed offers given to patient as well as family via phone- they will decide and pick for d/c tomorrow- MD indicates d/c tomorrow is likely-  Reece Levy, MSW, [employer] [phone]

## 2013-11-24 NOTE — Progress Notes (Signed)
Patient ID: Alexandra Snyder, female   DOB: 08-28-1934, 79 y.o.   MRN: [id number] Subjective: 2 Days Post-Op Procedure(s) (LRB): INTRAMEDULLARY (IM) NAIL FEMORAL (Right)    Patient reports pain as moderate.  Limited activity over weekend, no events recorded  Objective:   VITALS:   Filed Vitals:   11/24/13 0900  BP: 146/78  Pulse: 118  Temp: 98.2 F (36.8 C)  Resp: 18    Neurovascular intact Incision: dressing C/D/I bilaterally  LABS  Recent Labs  11/22/13 0528 11/22/13 1217 11/23/13 0530 11/24/13 0515  HGB 7.1* 10.5* 9.3* 8.5*  HCT 20.6* 31.1* 26.9* 25.2*  WBC 9.5  --  9.8 10.4  PLT 203  --  169 195     Recent Labs  11/22/13 0528 11/23/13 0530 11/24/13 0515  NA 137 136 136  K 3.7 3.6 3.9  BUN 19 9 11   CREATININE 0.76 0.62 0.68  GLUCOSE 128* 124* 112*    No results found for this basename: LABPT, INR,  in the last 72 hours   Assessment/Plan: 2 Days Post-Op Procedure(s) (LRB): INTRAMEDULLARY (IM) NAIL FEMORAL (Right)   Advance diet Up with therapy Discharge to SNF when medically stable  Return to office in 2 weeks  Needs to continue to work on left lower extremity strengthening as she is recently status post left total hip replacement

## 2013-11-24 NOTE — Progress Notes (Addendum)
Physical Therapy Treatment Patient Details Name: Alexandra Snyder MRN: [id number] DOB: Dec 02, 1934 Today's Date: 11/24/2013 Time: 4098-1191 PT Time Calculation (min): 38 min  PT Assessment / Plan / Recommendation  History of Present Illness 79 yo female s/p ORIF R femur 11/29. Hx of L THA-direct anterior 11/25.    PT Comments   Progressing slowly with mobility. Limited by pain. Plan is for possible d/c on tomorrow.   Follow Up Recommendations  SNF     Does the patient have the potential to tolerate intense rehabilitation     Barriers to Discharge        Equipment Recommendations  None recommended by PT    Recommendations for Other Services OT consult  Frequency Min 3X/week   Progress towards PT Goals Progress towards PT goals: Progressing toward goals  Plan Current plan remains appropriate    Precautions / Restrictions Precautions Precautions: Fall Restrictions Weight Bearing Restrictions: Yes RLE Weight Bearing: Partial weight bearing RLE Partial Weight Bearing Percentage or Pounds: 50 LLE Weight Bearing: Partial weight bearing LLE Partial Weight Bearing Percentage or Pounds: 50% Other Position/Activity Restrictions: verbal confirmation from PA [facility]) that it was okay for pt to perform stand pivot with walker   Pertinent Vitals/Pain 8/10 bil LEs. Pain meds given during session,. Ice applied end of session    Mobility  Bed Mobility Bed Mobility: Sit to Supine Supine to Sit: Patient Percentage: 50% Sit to Supine: 1: +2 Total assist Sit to Supine: Patient Percentage: 50% Details for Bed Mobility Assistance: Assist for bil LEs and trunk to supine Transfers Transfers: Sit to Stand;Stand to Sit Sit to Stand: 1: +2 Total assist Sit to Stand: Patient Percentage: 40% Stand to Sit: 1: +2 Total assist Stand to Sit: Patient Percentage: 40% Stand Pivot Transfers: 1: +2 Total assist Stand Pivot Transfers: Patient Percentage: 60% Details for Transfer Assistance: Pt already up  sitting on BSC. Assist to rise, stabilize, control descent. Multimodal cueing for safety, posture, technique, use of UEs to aid with WBing/adherence to PWB status during pivot. Pt took 1-2 pivotal steps and bed was brought up behind pt to sit.  Ambulation/Gait Ambulation/Gait Assistance: Not tested (comment) Ambulation/Gait Assistance Details: Pt unable to ambulate at current WB status    Exercises  ROM exercises in supine APs x 10, bilateral QSs x 10, bilateral HS x 10 L LE, x 5 R LE Hip abd/add x 10 L LE, x 5 R LE   PT Diagnosis:    PT Problem List:   PT Treatment Interventions:     PT Goals (current goals can now be found in the care plan section)    Visit Information  Last PT Received On: 11/24/13 Assistance Needed: +2 History of Present Illness: 79 yo female s/p ORIF R femur 11/29. Hx of L THA-direct anterior 11/25.     Subjective Data      Cognition  Cognition Arousal/Alertness: Awake/alert Behavior During Therapy: WFL for tasks assessed/performed Overall Cognitive Status: Within Functional Limits for tasks assessed    Balance     End of Session PT - End of Session Activity Tolerance: Patient limited by fatigue;Patient limited by pain Patient left: in bed;with call bell/phone within reach   GP     Rebeca Alert, MPT Pager: [phone]

## 2013-11-24 NOTE — Progress Notes (Signed)
[facility] PROGRESS NOTE  NESA DISTEL ZOX:[id number] DOB: [birth year] DOA: 11/21/2013 PCP: [name], MD  Assessment/Plan: Right intertrochanteric femur fracture  -Secondary to mechanical fall  -s/p ORIF 11/22/13  -DVT prophylaxis per ortho ??restart Xarelto  -pain control  -stool softeners  -s/p R-THA on 11/18/13  COPD  -stable  -continue albuterol neb  Left Breast Cancer  -continue Arimidex  Leukocytosis  -Likely due to acute medical condition  -UA -improved  Anemia--iron deficiency  -may have been from recent THA  -Iron saturation 7%  -continue iron--pt was not taking it at home  -B12--597, RBC folate  -retic--1.8%  -FOBT will be positive on iron; monitor for hematochezia  Tachycardia  -Likely due to acute pain  -EKG shows sinus tachycardia, negative for ST-T wave changes.  -TSH--.013-->check free T4 and total T3 -partly due to nebs  Family Communication:   Pt at beside Disposition Plan:   SNF 11/25/13         Procedures/Studies: Dg Hip Complete Right  11/21/2013   CLINICAL DATA:  Right hip pain after a fall.  EXAM: RIGHT HIP - COMPLETE 2+ VIEW  COMPARISON:  None.  FINDINGS: There is a comminuted appearing fracture of the right hip involving the intertrochanteric and sub trochanteric region. There is medial displacement and impaction of the distal fracture fragment with varus angulation. There is no dislocation of the hip joint. There are prominent degenerative changes in the hip with prominent osteophytes on the acetabular and femoral sides. Incidental note of a previous left hip arthroplasty. The pelvis appears intact.  IMPRESSION: Comminuted fracture of the intertrochanteric and sub trochanteric region of the proximal right femur.   Electronically Signed   By: Burman Nieves M.D.   On: 11/21/2013 05:52   Dg Hip Operative Right  11/22/2013   CLINICAL DATA:  Right hip fracture  EXAM: DG OPERATIVE RIGHT HIP  TECHNIQUE: A single spot  fluoroscopic AP image of the right hip is submitted.  COMPARISON:  Answer previous  FINDINGS: 3 intraoperative fluoroscopic spot images document sliding screw and IM rod placement across the intertrochanteric fracture, fragments in near anatomic alignment. There is a single distal interlocking screw.  IMPRESSION: Internal fixation of right intertrochanteric fracture without apparent complication   Electronically Signed   By: Oley Balm M.D.   On: 11/22/2013 12:42   Dg Femur Right  11/21/2013   CLINICAL DATA:  Severe right hip pain after a fall.  EXAM: RIGHT FEMUR - 2 VIEW  COMPARISON:  None.  FINDINGS: There is an intertrochanteric and subtrochanteric fracture of the proximal right femur with medial displacement and varus angulation of the distal fracture fragments. There is no dislocation of the hip joint. The distal femur appears intact.  IMPRESSION: Intertrochanteric/subtrochanteric fracture of the proximal right femur.   Electronically Signed   By: Burman Nieves M.D.   On: 11/21/2013 05:51   Ct Head Wo Contrast  11/21/2013   CLINICAL DATA:  Fall  EXAM: CT HEAD WITHOUT CONTRAST  TECHNIQUE: Contiguous axial images were obtained from the base of the skull through the vertex without intravenous contrast.  COMPARISON:  None available  FINDINGS: There is mild generalized atrophy. Scattered and confluent hypodensity within the periventricular and deep white matter is most consistent with chronic small vessel ischemic changes.  There is no acute intracranial hemorrhage or infarct. No mass lesion or midline shift. Gray-white matter differentiation is well maintained. Ventricles are normal in size without evidence of hydrocephalus. CSF containing spaces are within normal limits. No  extra-axial fluid collection.  The calvarium is intact.  Orbital soft tissues are within normal limits.  The paranasal sinuses and mastoid air cells are well pneumatized and free of fluid.  Scalp soft tissues are unremarkable.   IMPRESSION: 1. No acute intracranial process. 2. Mild atrophy and chronic microvascular ischemic disease.   Electronically Signed   By: Rise Mu M.D.   On: 11/21/2013 06:10   Dg Pelvis Portable  11/18/2013   CLINICAL DATA:  Postoperative total knee left hip replacement  EXAM: PORTABLE PELVIS 1-2 VIEWS  COMPARISON:  June 25, 2013  FINDINGS: Patient is status post total left hip replacement with no malalignment. Air is identified within the soft tissues of the left hip consistent with recent postoperative change. Degenerative joint changes of right hip with narrowed joint space and osteophyte formation are noted.  IMPRESSION: A left hip replacement without malalignment. Postoperative changes within the soft tissues of left hip.   Electronically Signed   By: Sherian Rein M.D.   On: 11/18/2013 17:09   Dg Hip Portable 1 View Left  11/18/2013   CLINICAL DATA:  Postoperative left hip replacement  EXAM: PORTABLE LEFT HIP - 1 VIEW  COMPARISON:  June 25, 2013  FINDINGS: Portable cross-table lateral view of the left hip demonstrate total left hip replacement without malalignment.  IMPRESSION: Portable cross-table lateral view of left hip demonstrating left hip replacement without malalignment.   Electronically Signed   By: Sherian Rein M.D.   On: 11/18/2013 17:11   Dg C-arm 1-60 Min-no Report  11/18/2013   CLINICAL DATA: left hip arthroplasty   C-ARM 1-60 MINUTES  Fluoroscopy was utilized by the requesting physician.  No radiographic  interpretation.          Subjective: Patient still continues to have significant pain with therapy. Denies fevers, chills, chest discomfort, shortness breath, coughing, hemoptysis, nausea, vomiting, diarrhea. She did have one loose stool today. No abdominal pain. No dysuria.  Objective: Filed Vitals:   11/24/13 0847 11/24/13 0900 11/24/13 1331 11/24/13 1350  BP:  146/78 119/74   Pulse:  118 108   Temp:  98.2 F (36.8 C) 98.2 F (36.8 C)   TempSrc:  Oral  Oral   Resp:  18 16   Height:      Weight:      SpO2: 98% 98% 95% 95%    Intake/Output Summary (Last 24 hours) at 11/24/13 1438 Last data filed at 11/23/13 2016  Gross per 24 hour  Intake    300 ml  Output    625 ml  Net   -325 ml   Weight change:  Exam:   General:  Pt is alert, follows commands appropriately, not in acute distress  HEENT: No icterus, No thrush, No neck mass, [place]/AT  Cardiovascular: RRR, S1/S2, no rubs, no gallops  Respiratory: Diminished breath sounds at the bases but clear to auscultation.  Abdomen: Soft/+BS, non tender, non distended, no guarding  Extremities: No edema, No lymphangitis, No petechiae, No rashes, no synovitis  Data Reviewed: Basic Metabolic Panel:  Recent Labs Lab 11/20/13 0415 11/21/13 0615 11/22/13 0528 11/23/13 0530 11/24/13 0515  NA 137 135 137 136 136  K 3.9 3.6 3.7 3.6 3.9  CL 104 101 103 102 102  CO2 25 19 24 23 24   GLUCOSE 137* 116* 128* 124* 112*  BUN 12 22 19 9 11   CREATININE 0.68 0.81 0.76 0.62 0.68  CALCIUM 9.3 9.4 8.5 8.4 8.6   Liver Function Tests: No results found  for this basename: AST, ALT, ALKPHOS, BILITOT, PROT, ALBUMIN,  in the last 168 hours No results found for this basename: LIPASE, AMYLASE,  in the last 168 hours No results found for this basename: AMMONIA,  in the last 168 hours CBC:  Recent Labs Lab 11/20/13 0415 11/21/13 0615 11/22/13 0528 11/22/13 1217 11/23/13 0530 11/24/13 0515  WBC 13.8* 15.9* 9.5  --  9.8 10.4  NEUTROABS  --  13.0*  --   --   --   --   HGB 9.0* 9.4* 7.1* 10.5* 9.3* 8.5*  HCT 26.8* 27.6* 20.6* 31.1* 26.9* 25.2*  MCV 86.2 86.0 86.6  --  85.1 86.6  PLT 178 190 203  --  169 195   Cardiac Enzymes: No results found for this basename: CKTOTAL, CKMB, CKMBINDEX, TROPONINI,  in the last 168 hours BNP: No components found with this basename: POCBNP,  CBG: No results found for this basename: GLUCAP,  in the last 168 hours  No results found for this or any previous visit  (from the past 240 hour(s)).   Scheduled Meds: . albuterol  2.5 mg Nebulization TID  . anastrozole  1 mg Oral Daily  . atorvastatin  40 mg Oral q1800  . cholecalciferol  1,000 Units Oral BID  . docusate sodium  100 mg Oral BID  . enoxaparin (LOVENOX) injection  30 mg Subcutaneous Q24H  . feeding supplement (ENSURE COMPLETE)  237 mL Oral BID BM  . ferrous sulfate  325 mg Oral TID PC  . polyethylene glycol  17 g Oral BID   Continuous Infusions: . sodium chloride 75 mL/hr at 11/23/13 2029  . lactated ringers    . sodium chloride 0.9 % 1,000 mL with potassium chloride 10 mEq infusion 20 mL/hr at 11/24/13 1400     Nickcole Bralley, DO  [facility] Pager [phone]  If 7PM-7AM, please contact night-coverage [email or web address] Password [email or web address] 11/24/2013, 2:38 PM   LOS: 3 days

## 2013-11-24 NOTE — Progress Notes (Signed)
Occupational Therapy Treatment Patient Details Name: Alexandra Snyder MRN: [id number] DOB: 1934-10-10 Today's Date: 11/24/2013 Time: 7829-5621 OT Time Calculation (min): 27 min  OT Assessment / Plan / Recommendation     OT comments  Pt pleased to clean dentures and apply face lotion  Follow Up Recommendations  SNF       Equipment Recommendations  None recommended by OT       Frequency Min 2X/week   Progress towards OT Goals Progress towards OT goals: Progressing toward goals     Precautions / Restrictions Precautions Precautions: Fall Restrictions Weight Bearing Restrictions: Yes RLE Weight Bearing: Partial weight bearing RLE Partial Weight Bearing Percentage or Pounds: 50 LLE Weight Bearing: Partial weight bearing LLE Partial Weight Bearing Percentage or Pounds: 50%       ADL  Grooming: Performed;Denture care;Brushing hair;Set up Toilet Transfer: +2 Total assistance Toilet Transfer: Patient Percentage: 60% Toilet Transfer Method: Sit to stand Toilet Transfer Equipment: Bedside commode Toileting - Clothing Manipulation and Hygiene: +1 Total assistance Where Assessed - Toileting Clothing Manipulation and Hygiene: Standing      OT Goals(current goals can now be found in the care plan section)             Cognition  Cognition Arousal/Alertness: Awake/alert Behavior During Therapy: WFL for tasks assessed/performed Overall Cognitive Status: Within Functional Limits for tasks assessed    Mobility  Bed Mobility Bed Mobility: Sit to Supine Supine to Sit: Patient Percentage: 50% Sit to Supine: 1: +2 Total assist Sit to Supine: Patient Percentage: 30% Transfers Transfers: Sit to Stand;Stand to Sit Sit to Stand: 1: +2 Total assist;From elevated surface Sit to Stand: Patient Percentage: 50% Stand to Sit: 1: +2 Total assist;With armrests Stand to Sit: Patient Percentage: 50% Details for Transfer Assistance: Assist to rise, stabilize, control descent. Multimodal  cueing for safety, posture, technique, use of UEs to aid with WBing/adherence to PWB status during pivot.           End of Session OT - End of Session Equipment Utilized During Treatment: Rolling walker Activity Tolerance: Patient tolerated treatment well Patient left: in chair;with call bell/phone within reach Nurse Communication: Mobility status  GO     Alba Cory 11/24/2013, 12:45 PM

## 2013-11-24 NOTE — Discharge Summary (Signed)
Physician Discharge Summary  Patient ID: [name] MRN: [id number] DOB/AGE: 1934/07/18 79 y.o.  Admit date: 11/18/2013 Discharge date: 11/20/2013  Procedures:  Procedure(s) (LRB): LEFT TOTAL HIP ARTHROPLASTY ANTERIOR APPROACH (Left)  Attending Physician:  Dr. Durene Romans   Admission Diagnoses:   Left hip OA / pain  Discharge Diagnoses:  Principal Problem:   S/P left THA, AA Active Problems:   Expected blood loss anemia   Overweight (BMI 25.0-29.9)  Past Medical History  Diagnosis Date  . Breast cancer     left  . Asthma   . COPD (chronic obstructive pulmonary disease)   . GERD (gastroesophageal reflux disease)   . History of radiation therapy 05/27/2013-06/23/2013    50 Gy to left breast  . Hypercholesteremia   . Shortness of breath   . Arthritis     hips  . PONV (postoperative nausea and vomiting)     HPI: Alexandra Snyder, 79 y.o. female, has a history of pain and functional disability in the left hip(s) due to arthritis and patient has failed non-surgical conservative treatments for greater than 12 weeks to include NSAID's and/or analgesics, use of assistive devices and activity modification. Onset of symptoms was gradual starting 1.5+ years ago with gradually worsening course since that time.The patient noted no past surgery on the left hip(s). Patient currently rates pain in the left hip at 10 out of 10 with activity. Patient has night pain, worsening of pain with activity and weight bearing, trendelenberg gait, pain that interfers with activities of daily living and pain with passive range of motion. Patient has evidence of periarticular osteophytes and joint space narrowing by imaging studies. This condition presents safety issues increasing the risk of falls. There is no current active infection. Risks, benefits and expectations were discussed with the patient. Risks including but not limited to the risk of anesthesia, blood clots, nerve damage, blood vessel  damage, failure of the prosthesis, infection and up to and including death. Patient understand the risks, benefits and expectations and wishes to proceed with surgery.   PCP: Alexandra Garnet., MD   Discharged Condition: good  Hospital Course:  Patient underwent the above stated procedure on 11/18/2013. Patient tolerated the procedure well and brought to the recovery room in good condition and subsequently to the floor.  POD #1 BP: 119/78 ; Pulse: 87 ; Temp: 98.2 F (36.8 C) ; Resp: 18  Pt's foley was removed. IV was changed to a saline lock. Patient reports pain as mild, pain controlled. No events throughout the night. Got to her room late, and didn't work with PT yesterday. Discussed her PWB status, she understands. Neurovascular intact, dorsiflexion/plantar flexion intact, incision: dressing C/D/I, no cellulitis present and compartment soft.   LABS  Basename    HGB  9.6  HCT  28.4   POD #2  BP: 167/84 ; Pulse: 112 ; Temp: 98.1 F (36.7 C) ; Resp: 20  Patient reports pain as mild, pain really well controlled. Some nausea and diarrhea yesterday, but feels these have resolved today. No events throughout the night. Ready to be discharged home. Neurovascular intact, dorsiflexion/plantar flexion intact, incision: dressing C/D/I, no cellulitis present and compartment soft.   LABS  Basename    HGB  9.0  HCT  26.8    Discharge Exam: General appearance: alert, cooperative and no distress Extremities: Homans sign is negative, no sign of DVT, no edema, redness or tenderness in the calves or thighs and no ulcers, gangrene or trophic changes  Disposition:  Home with follow up in 2 weeks   Follow-up Information   Follow up with Alexandra Pal, MD. Schedule an appointment as soon as possible for a visit in 2 weeks.   Specialty:  Orthopedic Surgery   Contact information:   [address] [place] [address] [phone]       Discharge Orders   Future  Appointments Provider Department Dept Phone   05/15/2014 10:30 AM Windell [facility] MEDICAL ONCOLOGY [phone]   05/15/2014 11:00 AM Victorino December, MD [facility] MEDICAL ONCOLOGY [phone]   Future Orders Complete By Expires   Call MD / Call 911  As directed    Comments:     If you experience chest pain or shortness of breath, CALL 911 and be transported to the hospital emergency room.  If you develope a fever above 101 F, pus (white drainage) or increased drainage or redness at the wound, or calf pain, call your surgeon's office.   Change dressing  As directed    Comments:     Maintain surgical dressing for 10-14 days, then replace with 4x4 guaze and tape. Keep the area dry and clean.   Constipation Prevention  As directed    Comments:     Drink plenty of fluids.  Prune juice may be helpful.  You may use a stool softener, such as Colace (over the counter) 100 mg twice a day.  Use MiraLax (over the counter) for constipation as needed.   Diet - low sodium heart healthy  As directed    Discharge instructions  As directed    Comments:     Maintain surgical dressing for 10-14 days, then replace with gauze and tape. Keep the area dry and clean until follow up. Follow up in 2 weeks at [facility]. Call with any questions or concerns.   Driving restrictions  As directed    Comments:     No driving for 4 weeks   Increase activity slowly as tolerated  As directed    TED hose  As directed    Comments:     Use stockings (TED hose) for 2 weeks on both leg(s).  You may remove them at night for sleeping.   Weight bearing as tolerated  As directed    Questions:     Laterality:     Extremity:          Medication List    STOP taking these medications       ibuprofen 200 MG tablet  Commonly known as:  ADVIL,MOTRIN     naproxen sodium 220 MG tablet  Commonly known as:  ANAPROX      TAKE these medications       albuterol 108 (90 BASE)  MCG/ACT inhaler  Commonly known as:  PROVENTIL HFA;VENTOLIN HFA  Inhale 2 puffs into the lungs every 4 (four) hours as needed for wheezing.     anastrozole 1 MG tablet  Commonly known as:  ARIMIDEX  Take 1 mg by mouth daily.     aspirin EC 325 MG tablet  Take 1 tablet (325 mg total) by mouth 2 (two) times daily. Take for 4 weeks.     cholecalciferol 1000 UNITS tablet  Commonly known as:  VITAMIN D  Take 1,000 Units by mouth 2 (two) times daily.     DSS 100 MG Caps  Take 100 mg by mouth 2 (two) times daily.     ferrous sulfate 325 (65  FE) MG tablet  Take 1 tablet (325 mg total) by mouth 3 (three) times daily after meals.     HYDROcodone-acetaminophen 5-325 MG per tablet  Commonly known as:  NORCO/VICODIN  Take 1-2 tablets by mouth every 4 (four) hours as needed for moderate pain.     polyethylene glycol packet  Commonly known as:  MIRALAX / GLYCOLAX  Take 17 g by mouth 2 (two) times daily as needed.     rivaroxaban 10 MG Tabs tablet  Commonly known as:  XARELTO  Take 1 tablet (10 mg total) by mouth daily.     rosuvastatin 20 MG tablet  Commonly known as:  CRESTOR  Take 20 mg by mouth daily.           Signed: Anastasio Auerbach. Khylan Snyder   PAC  11/24/2013, 5:28 PM

## 2013-11-25 DIAGNOSIS — S72009S Fracture of unspecified part of neck of unspecified femur, sequela: Secondary | ICD-10-CM

## 2013-11-25 DIAGNOSIS — D500 Iron deficiency anemia secondary to blood loss (chronic): Secondary | ICD-10-CM

## 2013-11-25 LAB — BASIC METABOLIC PANEL
BUN: 11 mg/dL (ref 6–23)
Calcium: 8.7 mg/dL (ref 8.4–10.5)
Chloride: 101 mEq/L (ref 96–112)
Creatinine, Ser: 0.6 mg/dL (ref 0.50–1.10)
GFR calc Af Amer: >90 mL/min (ref >90)
GFR calc non Af Amer: 86 mL/min — ABNORMAL LOW (ref >90)
Potassium: 3.7 mEq/L (ref 3.5–5.1)

## 2013-11-25 MED ORDER — ENSURE COMPLETE PO LIQD: 237.0000 mL | Freq: Two times a day (BID) | ORAL | Status: DC

## 2013-11-25 NOTE — Progress Notes (Signed)
Patient ID: Alexandra Snyder, female   DOB: 1934-03-14, 79 y.o.   MRN: [id number] Subjective: 3 Days Post-Op Procedure(s) (LRB): INTRAMEDULLARY (IM) NAIL FEMORAL (Right)    Patient reports pain as moderate.  Mainly complains of right hip soreness  Objective:   VITALS:   Filed Vitals:   11/25/13 0505  BP: 132/84  Pulse: 99  Temp: 98.5 F (36.9 C)  Resp: 20    Neurovascular intact Incision: dressing C/D/I - both hip dressings  LABS  Recent Labs  11/22/13 1217 11/23/13 0530 11/24/13 0515  HGB 10.5* 9.3* 8.5*  HCT 31.1* 26.9* 25.2*  WBC  --  9.8 10.4  PLT  --  169 195     Recent Labs  11/23/13 0530 11/24/13 0515 11/25/13 0427  NA 136 136 136  K 3.6 3.9 3.7  BUN 9 11 11   CREATININE 0.62 0.68 0.60  GLUCOSE 124* 112* 134*    No results found for this basename: LABPT, INR,  in the last 72 hours   Assessment/Plan: 3 Days Post-Op Procedure(s) (LRB): INTRAMEDULLARY (IM) NAIL FEMORAL (Right)   Advance diet Up with therapy Discharge to SNF today  PWB right lower extremity until follow up Aspirin for DVT prophylaxis  RTC in 2 weeks

## 2013-11-25 NOTE — Discharge Summary (Signed)
Physician Discharge Summary  DANTE COOTER GNF:[id number] DOB: 1934-10-24 DOA: 11/21/2013  PCP: [name], MD  Admit date: 11/21/2013 Discharge date: 11/25/2013  Time spent: > 35 minutes  Recommendations for Outpatient Follow-up:  Please be sure to followup with orthopedic surgeon after discharge.  Discharge Diagnoses:  Active Problems:   Cancer of upper-outer quadrant of female breast   COPD (chronic obstructive pulmonary disease)   Closed right hip fracture   Intertrochanteric fracture of right hip   Anemia   Leukocytosis   Anemia, iron deficiency   Discharge Condition: Stable  Diet recommendation:  Heart healthy  Filed Weights   11/21/13 1328  Weight: 61.689 kg (136 lb)    History of present illness:  79 year old Caucasian female with history of COPD, left breast cancer, and hyperlipidemia who presented to the ED complaining of right hip pain after a mechanical fall.  Prior to admission had an elective right total hip arthroplasty on 11/18/2013 and at that point was placed on xarelto for DVT prophylaxis.  In the ED was diagnosed with right femur intertrochanteric fracture and was subsequently admitted for further evaluation and recommendations.  Hospital Course:   Right intertrochanteric femur fracture  -Secondary to mechanical fall  -s/p ORIF 11/22/13  -DVT prophylaxis per ortho and currently recommending aspirin -pain control per ortho and script has been provided. -stool softeners  -s/p R-THA on 11/18/13   COPD  -stable  -continue albuterol neb   Left Breast Cancer  -continue Arimidex   Leukocytosis  -Resolved and most likely 2ary to recent stress response from fracture.  Anemia--iron deficiency  -may have been from recent THA  -Iron saturation 7%  -continue iron--pt was not taking it at home   Tachycardia  -Likely due to acute pain and neb treatment. -EKG shows sinus tachycardia, negative for ST-T wave changes.  -TSH--.013--> Free T4 and  total T3 within normal limits. Recommend checking TSH in 1-2 weeks   Procedures: 3 Days Post-Op Procedure(s) (LRB):  INTRAMEDULLARY (IM) NAIL FEMORAL (Right)  Consultations:  Ortho: Dr. Charlann Boxer  Discharge Exam: Filed Vitals:   11/25/13 1000  BP: 146/58  Pulse: 104  Temp: 98.3 F (36.8 C)  Resp: 21    General: Pt in NAD, Alert and Awake Cardiovascular: RRR, no MRG Respiratory: CTA BL, no wheezes  Discharge Instructions  Discharge Orders   Future Appointments Provider Department Dept Phone   05/15/2014 10:30 AM [facility] [phone]   05/15/2014 11:00 AM Victorino December, MD [facility] [phone]   Future Orders Complete By Expires   Call MD for:  redness, tenderness, or signs of infection (pain, swelling, redness, odor or green/yellow discharge around incision site)  As directed    Call MD for:  severe uncontrolled pain  As directed    Call MD for:  temperature >100.4  As directed    Diet - low sodium heart healthy  As directed    Discharge instructions  As directed    Comments:     Please be sure to followup with orthopedic surgeons within 2 weeks after discharge.  Followup with primary care physician within the next week or sooner should any new concerns arise   Increase activity slowly  As directed    Partial weight bearing  As directed    Scheduling Instructions:     50%   Questions:     % Body Weight:  50%   Laterality:  right   Extremity:  Lower       Medication List    STOP taking these medications       rivaroxaban 10 MG Tabs tablet  Commonly known as:  XARELTO      TAKE these medications       albuterol 108 (90 BASE) MCG/ACT inhaler  Commonly known as:  PROVENTIL HFA;VENTOLIN HFA  Inhale 2 puffs into the lungs every 4 (four) hours as needed for wheezing.     anastrozole 1 MG tablet  Commonly known as:  ARIMIDEX  Take 1 mg by mouth daily.     aspirin EC 325 MG  tablet  Take 1 tablet (325 mg total) by mouth 2 (two) times daily. Take for 4 weeks.     cholecalciferol 1000 UNITS tablet  Commonly known as:  VITAMIN D  Take 1,000 Units by mouth 2 (two) times daily.     DSS 100 MG Caps  Take 100 mg by mouth 2 (two) times daily.     feeding supplement (ENSURE COMPLETE) Liqd  Take 237 mLs by mouth 2 (two) times daily between meals.     ferrous sulfate 325 (65 FE) MG tablet  Take 1 tablet (325 mg total) by mouth 3 (three) times daily after meals.     HYDROcodone-acetaminophen 5-325 MG per tablet  Commonly known as:  NORCO  Take 1-2 tablets by mouth every 6 (six) hours as needed.     ibuprofen 200 MG tablet  Commonly known as:  ADVIL,MOTRIN  Take 200-400 mg by mouth every 6 (six) hours as needed for moderate pain.     polyethylene glycol packet  Commonly known as:  MIRALAX / GLYCOLAX  Take 17 g by mouth 2 (two) times daily as needed.     rosuvastatin 20 MG tablet  Commonly known as:  CRESTOR  Take 20 mg by mouth daily.       Allergies  Allergen Reactions  . Celebrex [Celecoxib] Itching    Hands itch  . Hydrocodone Itching    Pt. states that she tolerates 1/2 to 1 tablet, but may need to take Benadryl if she takes 2 tablets.       Follow-up Information   Follow up with Shelda Pal, MD. Schedule an appointment as soon as possible for a visit in 2 weeks.   Specialty:  Orthopedic Surgery   Contact information:   [address] [place] [address] [phone]        The results of significant diagnostics from this hospitalization (including imaging, microbiology, ancillary and laboratory) are listed below for reference.    Significant Diagnostic Studies: Dg Hip Complete Right  11/21/2013   CLINICAL DATA:  Right hip pain after a fall.  EXAM: RIGHT HIP - COMPLETE 2+ VIEW  COMPARISON:  None.  FINDINGS: There is a comminuted appearing fracture of the right hip involving the intertrochanteric and sub trochanteric  region. There is medial displacement and impaction of the distal fracture fragment with varus angulation. There is no dislocation of the hip joint. There are prominent degenerative changes in the hip with prominent osteophytes on the acetabular and femoral sides. Incidental note of a previous left hip arthroplasty. The pelvis appears intact.  IMPRESSION: Comminuted fracture of the intertrochanteric and sub trochanteric region of the proximal right femur.   Electronically Signed   By: Burman Nieves M.D.   On: 11/21/2013 05:52   Dg Hip Operative Right  11/22/2013   CLINICAL DATA:  Right hip fracture  EXAM: DG OPERATIVE RIGHT  HIP  TECHNIQUE: A single spot fluoroscopic AP image of the right hip is submitted.  COMPARISON:  Answer previous  FINDINGS: 3 intraoperative fluoroscopic spot images document sliding screw and IM rod placement across the intertrochanteric fracture, fragments in near anatomic alignment. There is a single distal interlocking screw.  IMPRESSION: Internal fixation of right intertrochanteric fracture without apparent complication   Electronically Signed   By: Oley Balm M.D.   On: 11/22/2013 12:42   Dg Femur Right  11/21/2013   CLINICAL DATA:  Severe right hip pain after a fall.  EXAM: RIGHT FEMUR - 2 VIEW  COMPARISON:  None.  FINDINGS: There is an intertrochanteric and subtrochanteric fracture of the proximal right femur with medial displacement and varus angulation of the distal fracture fragments. There is no dislocation of the hip joint. The distal femur appears intact.  IMPRESSION: Intertrochanteric/subtrochanteric fracture of the proximal right femur.   Electronically Signed   By: Burman Nieves M.D.   On: 11/21/2013 05:51   Ct Head Wo Contrast  11/21/2013   CLINICAL DATA:  Fall  EXAM: CT HEAD WITHOUT CONTRAST  TECHNIQUE: Contiguous axial images were obtained from the base of the skull through the vertex without intravenous contrast.  COMPARISON:  None available  FINDINGS:  There is mild generalized atrophy. Scattered and confluent hypodensity within the periventricular and deep white matter is most consistent with chronic small vessel ischemic changes.  There is no acute intracranial hemorrhage or infarct. No mass lesion or midline shift. Gray-white matter differentiation is well maintained. Ventricles are normal in size without evidence of hydrocephalus. CSF containing spaces are within normal limits. No extra-axial fluid collection.  The calvarium is intact.  Orbital soft tissues are within normal limits.  The paranasal sinuses and mastoid air cells are well pneumatized and free of fluid.  Scalp soft tissues are unremarkable.  IMPRESSION: 1. No acute intracranial process. 2. Mild atrophy and chronic microvascular ischemic disease.   Electronically Signed   By: Rise Mu M.D.   On: 11/21/2013 06:10   Dg Pelvis Portable  11/18/2013   CLINICAL DATA:  Postoperative total knee left hip replacement  EXAM: PORTABLE PELVIS 1-2 VIEWS  COMPARISON:  June 25, 2013  FINDINGS: Patient is status post total left hip replacement with no malalignment. Air is identified within the soft tissues of the left hip consistent with recent postoperative change. Degenerative joint changes of right hip with narrowed joint space and osteophyte formation are noted.  IMPRESSION: A left hip replacement without malalignment. Postoperative changes within the soft tissues of left hip.   Electronically Signed   By: Sherian Rein M.D.   On: 11/18/2013 17:09   Dg Hip Portable 1 View Left  11/18/2013   CLINICAL DATA:  Postoperative left hip replacement  EXAM: PORTABLE LEFT HIP - 1 VIEW  COMPARISON:  June 25, 2013  FINDINGS: Portable cross-table lateral view of the left hip demonstrate total left hip replacement without malalignment.  IMPRESSION: Portable cross-table lateral view of left hip demonstrating left hip replacement without malalignment.   Electronically Signed   By: Sherian Rein M.D.   On:  11/18/2013 17:11   Dg C-arm 1-60 Min-no Report  11/18/2013   CLINICAL DATA: left hip arthroplasty   C-ARM 1-60 MINUTES  Fluoroscopy was utilized by the requesting physician.  No radiographic  interpretation.     Microbiology: No results found for this or any previous visit (from the past 240 hour(s)).   Labs: Basic Metabolic Panel:  Recent Labs Lab  11/21/13 0615 11/22/13 0528 11/23/13 0530 11/24/13 0515 11/25/13 0427  NA 135 137 136 136 136  K 3.6 3.7 3.6 3.9 3.7  CL 101 103 102 102 101  CO2 19 24 23 24 25   GLUCOSE 116* 128* 124* 112* 134*  BUN 22 19 9 11 11   CREATININE 0.81 0.76 0.62 0.68 0.60  CALCIUM 9.4 8.5 8.4 8.6 8.7   Liver Function Tests: No results found for this basename: AST, ALT, ALKPHOS, BILITOT, PROT, ALBUMIN,  in the last 168 hours No results found for this basename: LIPASE, AMYLASE,  in the last 168 hours No results found for this basename: AMMONIA,  in the last 168 hours CBC:  Recent Labs Lab 11/20/13 0415 11/21/13 0615 11/22/13 0528 11/22/13 1217 11/23/13 0530 11/24/13 0515  WBC 13.8* 15.9* 9.5  --  9.8 10.4  NEUTROABS  --  13.0*  --   --   --   --   HGB 9.0* 9.4* 7.1* 10.5* 9.3* 8.5*  HCT 26.8* 27.6* 20.6* 31.1* 26.9* 25.2*  MCV 86.2 86.0 86.6  --  85.1 86.6  PLT 178 190 203  --  169 195   Cardiac Enzymes: No results found for this basename: CKTOTAL, CKMB, CKMBINDEX, TROPONINI,  in the last 168 hours BNP: BNP (last 3 results) No results found for this basename: PROBNP,  in the last 8760 hours CBG: No results found for this basename: GLUCAP,  in the last 168 hours     Signed:  Penny Pia  [facility] 11/25/2013, 11:28 AM

## 2013-11-25 NOTE — Progress Notes (Signed)
Pt has been discharged to [facility] rehab facility.  Report called to nurse at facility.  Pt had no complaints at time of discharge. Discharge instructions included in packet sent with pt to facility.

## 2013-11-25 NOTE — Progress Notes (Signed)
CSW met with pt / spoke to sister in-law to assist with d/c planning. Pt has chosen [facility] for [facility] placement. Pt will be d/c to SNF today via [facility] transport.   Cori Razor LCSW [phone]

## 2013-11-26 NOTE — Progress Notes (Signed)
Clinical Social Work Department CLINICAL SOCIAL WORK PLACEMENT NOTE 11/26/2013  Patient:  [name], [name]  Account Number:  [id number] Admit date:  11/21/2013  Clinical Social Worker:  Doroteo Glassman  Date/time:  11/23/2013 02:01 PM  Clinical Social Work is seeking post-discharge placement for this patient at the following level of care:   SKILLED NURSING   (*CSW will update this form in Epic as items are completed)   11/23/2013  Patient/family provided with [facility] Department of Clinical Social Work's list of facilities offering this level of care within the geographic area requested by the patient (or if unable, by the patient's family).  11/23/2013  Patient/family informed of their freedom to choose among providers that offer the needed level of care, that participate in Medicare, Medicaid or managed care program needed by the patient, have an available bed and are willing to accept the patient.  11/23/2013  Patient/family informed of [facility]' ownership interest in [facility], as well as of the fact that they are under no obligation to receive care at this facility.  PASARR submitted to EDS on 11/23/2013 PASARR number received from EDS on 11/23/2013  FL2 transmitted to all facilities in geographic area requested by pt/family on  11/23/2013 FL2 transmitted to all facilities within larger geographic area on   Patient informed that his/her managed care company has contracts with or will negotiate with  certain facilities, including the following:     Patient/family informed of bed offers received:  11/24/2013 Patient chooses bed at [facility] Physician recommends and patient chooses bed at    Patient to be transferred to [facility] on  11/25/2013 Patient to be transferred to facility by [facility]  The following physician request were entered in Epic:   Additional Comments:  Cori Razor LCSW [phone]

## 2013-11-27 LAB — URINE CULTURE

## 2014-03-30 ENCOUNTER — Encounter: Payer: Self-pay | Admitting: *Deleted

## 2014-03-30 NOTE — [facility] Oncology Navigator Note (Signed)
I called patient to f/u left breast cancer.  Patient reports that she is doing well regarding her breast.  She is scheduled for an annual mammogram 04/14/14.  She does have a little anxiety about the mammogram.  Patient is also recovering from a right hip fracture that occurred a few days s/p left hip arthroplasty in 10/2013.  Patient had surgery followed by rehab and had been doing very well with release from physical therapy and expected f/u with Dr. Alvan Dame in 6 months.  Last week patient was getting into a car and seemed to twist and pull a muscle high on her hip near her waist which has caused pain.  She reports that she has been applying heat and cold alternately and is experiencing some improvement.  I encouraged her to call her MD if her symptoms do not continue to improve.  We reviewed her upcoming appointments.  I  encouraged her to call me for any needs.  Patient verbalized understanding.

## 2014-04-16 ENCOUNTER — Encounter (HOSPITAL_COMMUNITY): Payer: Self-pay | Admitting: *Deleted

## 2014-04-16 ENCOUNTER — Encounter (HOSPITAL_COMMUNITY): Payer: Self-pay | Admitting: Pharmacy Technician

## 2014-04-16 NOTE — Progress Notes (Signed)
Need orders please - pt will be SAME DAY surgery on 04/21/14 thank you

## 2014-04-17 NOTE — H&P (Signed)
TOTAL HIP REVISION ADMISSION H&P  Patient is admitted for removal of hardware from right hip and conversion to a total hip arthroplasty  Subjective:  Chief Complaint:     Right hip pain s/p ORIF   HPI: Alexandra Alexandra Snyder, 80 y.o. female, complaining Alexandra Alexandra Snyder comes in today for an evaluation of right hip pain. Three weeks ago she started having more increased pain in her right hip. She is five months out from a compression hip screw of her right intertrochanteric fracture. The last X-ray she had in January showed her fracture to be in good position and alignment and the screw to be intraosseous.  She has decreased range of motion. There is pain with just about any motion of her right hip. Sensation and circulation are intact.Most recent x-rays show the screw to have cut out the femoral head and to be impinging in the acetabular region. Her fracture has started shifting.  She will need removal of her hardware and conversion to a total hip arthroplasty. She understands the issues and procedure. Risks, benefits and expectations were discussed with the patient.  Risks including but not limited to the risk of anesthesia, blood clots, nerve damage, blood vessel damage, failure of the prosthesis, infection and up to and including death.  Patient understand the risks, benefits and expectations and wishes to proceed with surgery.   D/C Plans:   Home with HHPT/SNF  Post-op Meds:     No Rx given  Tranexamic Acid:   Not to be given  Decadron:    To be given  FYI:    ASA post-op  Norco post-op   Patient Active Problem List   Diagnosis Date Noted  . Anemia, iron deficiency 11/22/2013  . Closed right hip fracture 11/21/2013  . Intertrochanteric fracture of right hip 11/21/2013  . Anemia 11/21/2013  . Leukocytosis 11/21/2013  . Expected blood loss anemia 11/19/2013  . Overweight (BMI 25.0-29.9) 11/19/2013  . S/P Alexandra Snyder THA, AA 11/18/2013  . COPD (chronic obstructive pulmonary disease) 04/16/2013  . Cancer of  upper-outer quadrant of female breast 04/10/2013   Past Medical History  Diagnosis Date  . Breast cancer     Alexandra Snyder  . Asthma   . COPD (chronic obstructive pulmonary disease)   . History of radiation therapy 05/27/2013-06/23/2013    50 Gy to Alexandra Snyder breast  . Hypercholesteremia   . Shortness of breath   . Arthritis     hips  . PONV (postoperative nausea and vomiting)   . Neuropathy     feet  . History of transfusion     post op  . Difficulty sleeping   . History of nonmelanoma skin cancer     Past Surgical History  Procedure Laterality Date  . Colonoscopy w/ polypectomy  2004  . Breast lumpectomy with needle localization Alexandra Snyder 04/21/2013    Procedure: Alexandra Snyder BREAST WIRE GUIDED  LUMPECTOMY ;  Surgeon: Rolm Bookbinder, MD;  Location: [place];  Service: General;  Laterality: Alexandra Snyder;  . Tonsillectomy      as child  . Eye surgery Bilateral 2010    Cataract removal  . Total hip arthroplasty Alexandra Snyder 11/18/2013    Procedure: Alexandra Snyder TOTAL HIP ARTHROPLASTY ANTERIOR APPROACH;  Surgeon: Mauri Pole, MD;  Location: [facility];  Service: Orthopedics;  Laterality: Alexandra Snyder;  . Femur im nail Right 11/22/2013    Procedure: INTRAMEDULLARY (IM) NAIL FEMORAL;  Surgeon: Mauri Pole, MD;  Location: [facility];  Service: Orthopedics;  Laterality: Right;  . Cataracts removed  No prescriptions prior to admission   Allergies  Allergen Reactions  . Celebrex [Celecoxib] Itching    Hands itch  . Hydrocodone Itching    Pt. states that she tolerates 1/2 to 1 tablet, but may need to take Benadryl if she takes 2 tablets.    History  Substance Use Topics  . Smoking status: Former Smoker -- 1.00 packs/day for 41 years    Types: Cigarettes    Quit date: 05/08/2003  . Smokeless tobacco: Never Used  . Alcohol Use: No    Family History  Problem Relation Age of Onset  . Ovarian cancer Sister   . Hypertension Mother   . Heart Problems Brother     Pacemaker      Review of Systems  Constitutional: Negative.    HENT: Negative.   Eyes: Negative.   Respiratory: Positive for shortness of breath (on exertion).   Cardiovascular: Negative.   Gastrointestinal: Negative.   Genitourinary: Negative.   Musculoskeletal: Positive for joint pain.  Skin: Negative.   Neurological: Negative.   Endo/Heme/Allergies: Negative.   Psychiatric/Behavioral: The patient has insomnia.     Objective:  Physical Exam  Constitutional: She is oriented to person, place, and time. She appears well-developed and well-nourished.  HENT:  Head: Normocephalic and atraumatic.  Mouth/Throat: Oropharynx is clear and moist.  Eyes: Pupils are equal, round, and reactive to light.  Neck: Neck supple. No JVD present. No tracheal deviation present. No thyromegaly present.  Cardiovascular: Normal rate, regular rhythm, normal heart sounds and intact distal pulses.   Respiratory: Effort normal and breath sounds normal. No stridor. No respiratory distress. She has no wheezes.  GI: Soft. There is no tenderness. There is no guarding.  Musculoskeletal:       Right hip: She exhibits decreased range of motion, decreased strength, tenderness and bony tenderness. She exhibits no swelling, no deformity and no laceration.  Lymphadenopathy:    She has no cervical adenopathy.  Neurological: She is alert and oriented to person, place, and time.  Skin: Skin is warm and dry.  Psychiatric: She has a normal mood and affect.     Labs:  Estimated body mass index is 26.56 kg/(m^2) as calculated from the following:   Height as of 11/21/13: 5' (1.524 m).   Weight as of 11/21/13: 61.689 kg (136 lb).  Imaging Review:  Plain radiographs demonstrate nonunion, intertrochanteric fracture with cut out femoral head of the right hip(s). The bone quality appears to be good for age and reported activity level.   Assessment/Plan:  End stage arthritis, right hip(s) with failed previous nailing.  The patient history, physical examination, clinical judgement  of the provider and imaging studies are consistent with failed previous nailing of the right hip(s). Revision total hip arthroplasty is deemed medically necessary. The treatment options including medical management, injection therapy, arthroscopy and arthroplasty were discussed at length. The risks and benefits of total hip arthroplasty were presented and reviewed. The risks due to aseptic loosening, infection, stiffness, dislocation/subluxation,  thromboembolic complications and other imponderables were discussed.  The patient acknowledged the explanation, agreed to proceed with the plan and consent was signed. Patient is being admitted for inpatient treatment for surgery, pain control, PT, OT, prophylactic antibiotics, VTE prophylaxis, progressive ambulation and ADL's and discharge planning. The patient is planning to be discharged to skilled nursing facility / home.     [place] Pugh Srinivas Lippman   PAC  04/17/2014, 12:42 PM

## 2014-04-21 ENCOUNTER — Inpatient Hospital Stay (HOSPITAL_COMMUNITY): Payer: Medicare Other | Admitting: Anesthesiology

## 2014-04-21 ENCOUNTER — Inpatient Hospital Stay (HOSPITAL_COMMUNITY)
Admission: RE | Admit: 2014-04-21 | Discharge: 2014-04-24 | DRG: 470 | Disposition: A | Discharge status: Skilled Nursing Facility | Payer: Medicare Other | Source: Ambulatory Visit | Attending: Orthopedic Surgery | Admitting: Orthopedic Surgery

## 2014-04-21 ENCOUNTER — Encounter (HOSPITAL_COMMUNITY): Payer: Medicare Other | Admitting: Anesthesiology

## 2014-04-21 ENCOUNTER — Encounter (HOSPITAL_COMMUNITY)
Admission: RE | Disposition: A | Discharge status: Skilled Nursing Facility | Payer: Self-pay | Source: Ambulatory Visit | Attending: Orthopedic Surgery

## 2014-04-21 ENCOUNTER — Encounter (HOSPITAL_COMMUNITY): Payer: Self-pay | Admitting: *Deleted

## 2014-04-21 ENCOUNTER — Inpatient Hospital Stay (HOSPITAL_COMMUNITY): Payer: Medicare Other

## 2014-04-21 DIAGNOSIS — Z96659 Presence of unspecified artificial knee joint: Secondary | ICD-10-CM

## 2014-04-21 DIAGNOSIS — G589 Mononeuropathy, unspecified: Secondary | ICD-10-CM | POA: Diagnosis present

## 2014-04-21 DIAGNOSIS — Z923 Personal history of irradiation: Secondary | ICD-10-CM

## 2014-04-21 DIAGNOSIS — Z853 Personal history of malignant neoplasm of breast: Secondary | ICD-10-CM

## 2014-04-21 DIAGNOSIS — Z8041 Family history of malignant neoplasm of ovary: Secondary | ICD-10-CM

## 2014-04-21 DIAGNOSIS — Z87891 Personal history of nicotine dependence: Secondary | ICD-10-CM

## 2014-04-21 DIAGNOSIS — D62 Acute posthemorrhagic anemia: Secondary | ICD-10-CM | POA: Diagnosis not present

## 2014-04-21 DIAGNOSIS — Z96649 Presence of unspecified artificial hip joint: Secondary | ICD-10-CM

## 2014-04-21 DIAGNOSIS — J449 Chronic obstructive pulmonary disease, unspecified: Secondary | ICD-10-CM | POA: Diagnosis present

## 2014-04-21 DIAGNOSIS — Z85828 Personal history of other malignant neoplasm of skin: Secondary | ICD-10-CM

## 2014-04-21 DIAGNOSIS — Z888 Allergy status to other drugs, medicaments and biological substances status: Secondary | ICD-10-CM

## 2014-04-21 DIAGNOSIS — Z8249 Family history of ischemic heart disease and other diseases of the circulatory system: Secondary | ICD-10-CM

## 2014-04-21 DIAGNOSIS — E7800 Pure hypercholesterolemia, unspecified: Secondary | ICD-10-CM | POA: Diagnosis present

## 2014-04-21 DIAGNOSIS — M1610 Unilateral primary osteoarthritis, unspecified hip: Secondary | ICD-10-CM | POA: Diagnosis present

## 2014-04-21 DIAGNOSIS — IMO0002 Reserved for concepts with insufficient information to code with codable children: Principal | ICD-10-CM | POA: Diagnosis present

## 2014-04-21 DIAGNOSIS — M169 Osteoarthritis of hip, unspecified: Secondary | ICD-10-CM | POA: Diagnosis present

## 2014-04-21 DIAGNOSIS — J4489 Other specified chronic obstructive pulmonary disease: Secondary | ICD-10-CM | POA: Diagnosis present

## 2014-04-21 HISTORY — PX: CONVERSION TO TOTAL HIP: SHX5784

## 2014-04-21 HISTORY — DX: Polyneuropathy, unspecified: G62.9

## 2014-04-21 HISTORY — DX: Personal history of other malignant neoplasm of skin: Z85.828

## 2014-04-21 HISTORY — DX: Personal history of other medical treatment: Z92.89

## 2014-04-21 HISTORY — DX: Sleep disorder, unspecified: G47.9

## 2014-04-21 LAB — CBC
HEMATOCRIT: 34.2 % — AB (ref 36.0–46.0)
Hemoglobin: 11.7 g/dL — ABNORMAL LOW (ref 12.0–15.0)
MCH: 29 pg (ref 26.0–34.0)
MCHC: 34.2 g/dL (ref 30.0–36.0)
MCV: 84.7 fL (ref 78.0–100.0)
Platelets: 289 10*3/uL (ref 150–400)
RBC: 4.04 MIL/uL (ref 3.87–5.11)
RDW: 15.3 % (ref 11.5–15.5)
WBC: 6.1 10*3/uL (ref 4.0–10.5)

## 2014-04-21 LAB — BASIC METABOLIC PANEL
BUN: 16 mg/dL (ref 6–23)
CHLORIDE: 98 meq/L (ref 96–112)
CO2: 27 mEq/L (ref 19–32)
CREATININE: 0.77 mg/dL (ref 0.50–1.10)
Calcium: 10.7 mg/dL — ABNORMAL HIGH (ref 8.4–10.5)
GFR calc Af Amer: >90 mL/min (ref >90)
GFR calc non Af Amer: 78 mL/min — ABNORMAL LOW (ref >90)
Glucose, Bld: 99 mg/dL (ref 70–99)
Potassium: 3.6 mEq/L — ABNORMAL LOW (ref 3.7–5.3)
Sodium: 139 mEq/L (ref 137–147)

## 2014-04-21 LAB — URINALYSIS W MICROSCOPIC (NOT AT ARMC)
Bilirubin Urine: NEGATIVE
Glucose, UA: NEGATIVE mg/dL
Hgb urine dipstick: NEGATIVE
KETONES UR: NEGATIVE mg/dL
LEUKOCYTES UA: NEGATIVE
NITRITE: NEGATIVE
Protein, ur: NEGATIVE mg/dL
SPECIFIC GRAVITY, URINE: 1.009 (ref 1.005–1.030)
Urobilinogen, UA: 0.2 mg/dL (ref 0.0–1.0)
pH: 7 (ref 5.0–8.0)

## 2014-04-21 LAB — PROTIME-INR
INR: 1.04 (ref 0.00–1.49)
Prothrombin Time: 13.4 seconds (ref 11.6–15.2)

## 2014-04-21 LAB — SURGICAL PCR SCREEN
MRSA, PCR: POSITIVE — AB
Staphylococcus aureus: POSITIVE — AB

## 2014-04-21 LAB — APTT: APTT: 35 s (ref 24–37)

## 2014-04-21 SURGERY — CONVERSION, PREVIOUS HIP SURGERY, TO TOTAL HIP ARTHROPLASTY
Anesthesia: General | Site: Hip | Laterality: Right

## 2014-04-21 MED ORDER — DEXAMETHASONE SODIUM PHOSPHATE 10 MG/ML IJ SOLN
10.0000 mg | Freq: Once | INTRAMUSCULAR | Status: DC
  Filled 2014-04-21: qty 1

## 2014-04-21 MED ORDER — KETAMINE HCL 10 MG/ML IJ SOLN
INTRAMUSCULAR | Status: AC
  Filled 2014-04-21: qty 1

## 2014-04-21 MED ORDER — ONDANSETRON HCL 4 MG PO TABS: 4.0000 mg | ORAL_TABLET | Freq: Four times a day (QID) | ORAL | Status: DC | PRN

## 2014-04-21 MED ORDER — FENTANYL CITRATE 0.05 MG/ML IJ SOLN
25.0000 ug | INTRAMUSCULAR | Status: DC | PRN
  Administered 2014-04-21: 50 ug via INTRAVENOUS

## 2014-04-21 MED ORDER — GLYCOPYRROLATE 0.2 MG/ML IJ SOLN
INTRAMUSCULAR | Status: AC
  Filled 2014-04-21: qty 2

## 2014-04-21 MED ORDER — KETAMINE HCL 10 MG/ML IJ SOLN
INTRAMUSCULAR | Status: DC | PRN
  Administered 2014-04-21: 7 mg via INTRAVENOUS
  Administered 2014-04-21: 8 mg via INTRAVENOUS

## 2014-04-21 MED ORDER — METHOCARBAMOL 100 MG/ML IJ SOLN
500.0000 mg | Freq: Four times a day (QID) | INTRAMUSCULAR | Status: DC | PRN
  Administered 2014-04-21: 500 mg via INTRAVENOUS
  Filled 2014-04-21: qty 5

## 2014-04-21 MED ORDER — ACETAMINOPHEN NICU IV SYRINGE 10 MG/ML: 1000.0000 mg | Freq: Once | INTRAVENOUS | Status: DC

## 2014-04-21 MED ORDER — CISATRACURIUM BESYLATE (PF) 10 MG/5ML IV SOLN
INTRAVENOUS | Status: DC | PRN
  Administered 2014-04-21: 6 mg via INTRAVENOUS

## 2014-04-21 MED ORDER — PROPOFOL 10 MG/ML IV BOLUS
INTRAVENOUS | Status: AC
  Filled 2014-04-21: qty 20

## 2014-04-21 MED ORDER — HYDROMORPHONE HCL PF 1 MG/ML IJ SOLN
INTRAMUSCULAR | Status: DC | PRN
  Administered 2014-04-21: .25 mg via INTRAVENOUS
  Administered 2014-04-21 (×2): 0.5 mg via INTRAVENOUS
  Administered 2014-04-21: .25 mg via INTRAVENOUS
  Administered 2014-04-21: 0.5 mg via INTRAVENOUS

## 2014-04-21 MED ORDER — 0.9 % SODIUM CHLORIDE (POUR BTL) OPTIME
TOPICAL | Status: DC | PRN
  Administered 2014-04-21: 1000 mL

## 2014-04-21 MED ORDER — GLYCOPYRROLATE 0.2 MG/ML IJ SOLN
INTRAMUSCULAR | Status: DC | PRN
  Administered 2014-04-21: 0.2 mg via INTRAVENOUS
  Administered 2014-04-21: 0.4 mg via INTRAVENOUS

## 2014-04-21 MED ORDER — LIDOCAINE HCL (CARDIAC) 20 MG/ML IV SOLN
INTRAVENOUS | Status: AC
  Filled 2014-04-21: qty 5

## 2014-04-21 MED ORDER — SODIUM CHLORIDE 0.9 % IV SOLN
INTRAVENOUS | Status: DC
  Administered 2014-04-21 – 2014-04-22 (×2): via INTRAVENOUS
  Filled 2014-04-21 (×14): qty 1000

## 2014-04-21 MED ORDER — ONDANSETRON HCL 4 MG/2ML IJ SOLN
INTRAMUSCULAR | Status: DC | PRN
  Administered 2014-04-21 (×2): 2 mg via INTRAVENOUS

## 2014-04-21 MED ORDER — CEFAZOLIN SODIUM-DEXTROSE 2-3 GM-% IV SOLR
INTRAVENOUS | Status: AC
  Filled 2014-04-21: qty 50

## 2014-04-21 MED ORDER — FERROUS SULFATE 325 (65 FE) MG PO TABS
325.0000 mg | ORAL_TABLET | Freq: Three times a day (TID) | ORAL | Status: DC
  Administered 2014-04-22 – 2014-04-24 (×6): 325 mg via ORAL
  Filled 2014-04-21 (×11): qty 1

## 2014-04-21 MED ORDER — CEFAZOLIN SODIUM-DEXTROSE 2-3 GM-% IV SOLR
2.0000 g | INTRAVENOUS | Status: AC
  Administered 2014-04-21: 2 g via INTRAVENOUS

## 2014-04-21 MED ORDER — MENTHOL 3 MG MT LOZG
1.0000 | LOZENGE | OROMUCOSAL | Status: DC | PRN
  Filled 2014-04-21: qty 9

## 2014-04-21 MED ORDER — PROPOFOL 10 MG/ML IV BOLUS
INTRAVENOUS | Status: DC | PRN
  Administered 2014-04-21: 130 mg via INTRAVENOUS

## 2014-04-21 MED ORDER — STERILE WATER FOR IRRIGATION IR SOLN
Status: DC | PRN
  Administered 2014-04-21: 1500 mL

## 2014-04-21 MED ORDER — ALBUTEROL SULFATE (2.5 MG/3ML) 0.083% IN NEBU
2.5000 mg | INHALATION_SOLUTION | RESPIRATORY_TRACT | Status: DC | PRN
  Administered 2014-04-22: 2.5 mg via RESPIRATORY_TRACT
  Filled 2014-04-21: qty 3

## 2014-04-21 MED ORDER — VANCOMYCIN HCL IN DEXTROSE 1-5 GM/200ML-% IV SOLN
INTRAVENOUS | Status: AC
  Filled 2014-04-21: qty 200

## 2014-04-21 MED ORDER — SENNA 8.6 MG PO TABS
1.0000 | ORAL_TABLET | Freq: Two times a day (BID) | ORAL | Status: DC
  Administered 2014-04-21 – 2014-04-24 (×5): 8.6 mg via ORAL

## 2014-04-21 MED ORDER — MUPIROCIN 2 % EX OINT
TOPICAL_OINTMENT | Freq: Two times a day (BID) | CUTANEOUS | Status: DC
  Administered 2014-04-21 – 2014-04-22 (×2): 1 via NASAL
  Administered 2014-04-23 (×2): via NASAL
  Administered 2014-04-24: 1 via NASAL
  Filled 2014-04-21: qty 22

## 2014-04-21 MED ORDER — CHLORHEXIDINE GLUCONATE 4 % EX LIQD
60.0000 mL | Freq: Once | CUTANEOUS | Status: DC
Start: 2014-04-21 — End: 2014-04-21

## 2014-04-21 MED ORDER — HYDROCODONE-ACETAMINOPHEN 7.5-325 MG PO TABS
1.0000 | ORAL_TABLET | ORAL | Status: DC
  Administered 2014-04-21 – 2014-04-23 (×7): 1 via ORAL
  Administered 2014-04-23: 2 via ORAL
  Administered 2014-04-24 (×2): 1 via ORAL
  Filled 2014-04-21 (×2): qty 1
  Filled 2014-04-21: qty 2
  Filled 2014-04-21 (×2): qty 1
  Filled 2014-04-21 (×3): qty 2
  Filled 2014-04-21 (×2): qty 1

## 2014-04-21 MED ORDER — SUCCINYLCHOLINE CHLORIDE 20 MG/ML IJ SOLN
INTRAMUSCULAR | Status: DC | PRN
  Administered 2014-04-21: 100 mg via INTRAVENOUS

## 2014-04-21 MED ORDER — DIPHENHYDRAMINE HCL 12.5 MG/5ML PO ELIX: 25.0000 mg | ORAL_SOLUTION | Freq: Four times a day (QID) | ORAL | Status: DC | PRN

## 2014-04-21 MED ORDER — PROMETHAZINE HCL 25 MG/ML IJ SOLN
INTRAMUSCULAR | Status: AC
  Filled 2014-04-21: qty 1

## 2014-04-21 MED ORDER — ONDANSETRON HCL 4 MG/2ML IJ SOLN: 4.0000 mg | Freq: Four times a day (QID) | INTRAMUSCULAR | Status: DC | PRN

## 2014-04-21 MED ORDER — NEOSTIGMINE METHYLSULFATE 1 MG/ML IJ SOLN
INTRAMUSCULAR | Status: DC | PRN
  Administered 2014-04-21: 3 mg via INTRAVENOUS

## 2014-04-21 MED ORDER — METHOCARBAMOL 500 MG PO TABS
500.0000 mg | ORAL_TABLET | Freq: Four times a day (QID) | ORAL | Status: DC | PRN
  Administered 2014-04-22 – 2014-04-24 (×3): 500 mg via ORAL
  Filled 2014-04-21 (×3): qty 1

## 2014-04-21 MED ORDER — EPHEDRINE SULFATE 50 MG/ML IJ SOLN
INTRAMUSCULAR | Status: DC | PRN
  Administered 2014-04-21: 5 mg via INTRAVENOUS
  Administered 2014-04-21: 10 mg via INTRAVENOUS

## 2014-04-21 MED ORDER — MUPIROCIN 2 % EX OINT
TOPICAL_OINTMENT | Freq: Two times a day (BID) | CUTANEOUS | Status: DC
  Administered 2014-04-21: 08:00:00 via NASAL
  Filled 2014-04-21: qty 22

## 2014-04-21 MED ORDER — PROMETHAZINE HCL 25 MG/ML IJ SOLN
6.2500 mg | INTRAMUSCULAR | Status: DC | PRN
  Administered 2014-04-21: 6.25 mg via INTRAVENOUS

## 2014-04-21 MED ORDER — DOCUSATE SODIUM 100 MG PO CAPS
100.0000 mg | ORAL_CAPSULE | Freq: Two times a day (BID) | ORAL | Status: DC
  Administered 2014-04-21 – 2014-04-24 (×5): 100 mg via ORAL

## 2014-04-21 MED ORDER — ACETAMINOPHEN 10 MG/ML IV SOLN
1000.0000 mg | Freq: Once | INTRAVENOUS | Status: AC
  Administered 2014-04-21: 1000 mg via INTRAVENOUS
  Filled 2014-04-21: qty 100

## 2014-04-21 MED ORDER — PHENOL 1.4 % MT LIQD
1.0000 | OROMUCOSAL | Status: DC | PRN
  Filled 2014-04-21: qty 177

## 2014-04-21 MED ORDER — LACTATED RINGERS IV SOLN
INTRAVENOUS | Status: DC | PRN
  Administered 2014-04-21 (×2): via INTRAVENOUS

## 2014-04-21 MED ORDER — RIVAROXABAN 10 MG PO TABS
10.0000 mg | ORAL_TABLET | Freq: Every day | ORAL | Status: DC
  Administered 2014-04-22 – 2014-04-24 (×3): 10 mg via ORAL
  Filled 2014-04-21 (×4): qty 1

## 2014-04-21 MED ORDER — POLYETHYLENE GLYCOL 3350 17 G PO PACK: 17.0000 g | PACK | Freq: Every day | ORAL | Status: DC | PRN

## 2014-04-21 MED ORDER — HYDROMORPHONE HCL PF 1 MG/ML IJ SOLN: 0.5000 mg | INTRAMUSCULAR | Status: DC | PRN

## 2014-04-21 MED ORDER — HYDROMORPHONE HCL PF 2 MG/ML IJ SOLN
INTRAMUSCULAR | Status: AC
  Filled 2014-04-21: qty 1

## 2014-04-21 MED ORDER — CEFAZOLIN SODIUM 1-5 GM-% IV SOLN
1.0000 g | Freq: Four times a day (QID) | INTRAVENOUS | Status: AC
  Administered 2014-04-21 (×2): 1 g via INTRAVENOUS
  Filled 2014-04-21 (×2): qty 50

## 2014-04-21 MED ORDER — ANASTROZOLE 1 MG PO TABS
1.0000 mg | ORAL_TABLET | Freq: Every day | ORAL | Status: DC
  Administered 2014-04-22 – 2014-04-24 (×3): 1 mg via ORAL
  Filled 2014-04-21 (×4): qty 1

## 2014-04-21 MED ORDER — ALUM & MAG HYDROXIDE-SIMETH 200-200-20 MG/5ML PO SUSP: 30.0000 mL | ORAL | Status: DC | PRN

## 2014-04-21 MED ORDER — FENTANYL CITRATE 0.05 MG/ML IJ SOLN
INTRAMUSCULAR | Status: AC
  Filled 2014-04-21: qty 2

## 2014-04-21 MED ORDER — FENTANYL CITRATE 0.05 MG/ML IJ SOLN
INTRAMUSCULAR | Status: DC | PRN
  Administered 2014-04-21 (×2): 50 ug via INTRAVENOUS

## 2014-04-21 MED ORDER — DEXAMETHASONE SODIUM PHOSPHATE 10 MG/ML IJ SOLN
10.0000 mg | Freq: Once | INTRAMUSCULAR | Status: AC
  Administered 2014-04-21: 10 mg via INTRAVENOUS

## 2014-04-21 MED ORDER — VANCOMYCIN HCL IN DEXTROSE 1-5 GM/200ML-% IV SOLN
1000.0000 mg | Freq: Once | INTRAVENOUS | Status: AC
  Administered 2014-04-21: 1000 mg via INTRAVENOUS

## 2014-04-21 SURGICAL SUPPLY — 55 items
BAG SPEC THK2 15X12 ZIP CLS (MISCELLANEOUS) ×1
BAG ZIPLOCK 12X15 (MISCELLANEOUS) ×3 IMPLANT
BLADE SAW SGTL 18X1.27X75 (BLADE) ×2 IMPLANT
BLADE SAW SGTL 18X1.27X75MM (BLADE) ×1
BRUSH FEMORAL CANAL (MISCELLANEOUS) ×3 IMPLANT
CAPT HIP PF MOP ×3 IMPLANT
DERMABOND ADVANCED (GAUZE/BANDAGES/DRESSINGS) ×2
DERMABOND ADVANCED .7 DNX12 (GAUZE/BANDAGES/DRESSINGS) ×1 IMPLANT
DRAPE INCISE IOBAN 85X60 (DRAPES) ×3 IMPLANT
DRAPE ORTHO SPLIT 77X108 STRL (DRAPES) ×6
DRAPE POUCH INSTRU U-SHP 10X18 (DRAPES) ×3 IMPLANT
DRAPE SURG 17X11 SM STRL (DRAPES) ×3 IMPLANT
DRAPE SURG ORHT 6 SPLT 77X108 (DRAPES) ×2 IMPLANT
DRAPE U-SHAPE 47X51 STRL (DRAPES) ×3 IMPLANT
DRSG AQUACEL AG ADV 3.5X14 (GAUZE/BANDAGES/DRESSINGS) ×3 IMPLANT
DRSG EMULSION OIL 3X16 NADH (GAUZE/BANDAGES/DRESSINGS) IMPLANT
DRSG MEPILEX BORDER 4X4 (GAUZE/BANDAGES/DRESSINGS) IMPLANT
DRSG MEPILEX BORDER 4X8 (GAUZE/BANDAGES/DRESSINGS) IMPLANT
DURAPREP 26ML APPLICATOR (WOUND CARE) ×3 IMPLANT
ELECT BLADE TIP CTD 4 INCH (ELECTRODE) ×3 IMPLANT
ELECT REM PT RETURN 9FT ADLT (ELECTROSURGICAL) ×3
ELECTRODE REM PT RTRN 9FT ADLT (ELECTROSURGICAL) ×1 IMPLANT
EVACUATOR 1/8 PVC DRAIN (DRAIN) IMPLANT
FACESHIELD WRAPAROUND (MASK) ×6 IMPLANT
GLOVE BIOGEL PI IND STRL 7.5 (GLOVE) ×1 IMPLANT
GLOVE BIOGEL PI IND STRL 8 (GLOVE) ×1 IMPLANT
GLOVE BIOGEL PI INDICATOR 7.5 (GLOVE) ×2
GLOVE BIOGEL PI INDICATOR 8 (GLOVE) ×2
GLOVE ORTHO TXT STRL SZ7.5 (GLOVE) ×6 IMPLANT
GLOVE SURG ORTHO 8.0 STRL STRW (GLOVE) ×3 IMPLANT
GOWN SPEC L3 XXLG W/TWL (GOWN DISPOSABLE) ×6 IMPLANT
GOWN STRL REUS W/TWL LRG LVL3 (GOWN DISPOSABLE) ×3 IMPLANT
GRAFT IC CHAMBER MED (Bone Implant) ×3 IMPLANT
HANDPIECE INTERPULSE COAX TIP (DISPOSABLE) ×3
IMMOBILIZER KNEE 16 (SOFTGOODS) ×3 IMPLANT
KIT BASIN OR (CUSTOM PROCEDURE TRAY) ×3 IMPLANT
MANIFOLD NEPTUNE II (INSTRUMENTS) ×3 IMPLANT
NS IRRIG 1000ML POUR BTL (IV SOLUTION) ×6 IMPLANT
PACK TOTAL JOINT (CUSTOM PROCEDURE TRAY) ×3 IMPLANT
POSITIONER SURGICAL ARM (MISCELLANEOUS) ×3 IMPLANT
PRESSURIZER FEMORAL UNIV (MISCELLANEOUS) IMPLANT
SET HNDPC FAN SPRY TIP SCT (DISPOSABLE) ×1 IMPLANT
SPONGE LAP 18X18 X RAY DECT (DISPOSABLE) IMPLANT
SPONGE LAP 4X18 X RAY DECT (DISPOSABLE) IMPLANT
STAPLER VISISTAT 35W (STAPLE) IMPLANT
SUCTION FRAZIER TIP 10 FR DISP (SUCTIONS) ×3 IMPLANT
SUT MNCRL AB 4-0 PS2 18 (SUTURE) ×3 IMPLANT
SUT VIC AB 1 CT1 36 (SUTURE) ×9 IMPLANT
SUT VIC AB 2-0 CT1 27 (SUTURE) ×9
SUT VIC AB 2-0 CT1 TAPERPNT 27 (SUTURE) ×3 IMPLANT
SUT VLOC 180 0 24IN GS25 (SUTURE) ×6 IMPLANT
TOWEL OR 17X26 10 PK STRL BLUE (TOWEL DISPOSABLE) ×6 IMPLANT
TOWER CARTRIDGE SMART MIX (DISPOSABLE) IMPLANT
TRAY FOLEY CATH 14FRSI W/METER (CATHETERS) ×3 IMPLANT
WATER STERILE IRR 1500ML POUR (IV SOLUTION) ×3 IMPLANT

## 2014-04-21 NOTE — Discharge Instructions (Addendum)
Partial weight bearing right lower extremity, 50%. You may shower but the bandage should remain on.  Take Xarelto10mg  daily for prevention of blood clots.  Information on my medicine - XARELTO (Rivaroxaban)  This medication education was reviewed with me or my healthcare representative as part of my discharge preparation.  The pharmacist that spoke with me during my hospital stay was:  Lolita Patella, [place]  Why was Xarelto prescribed for you? Xarelto was prescribed for you to reduce the risk of blood clots forming after orthopedic surgery. The medical term for these abnormal blood clots is venous thromboembolism (VTE).  What do you need to know about xarelto ? Take your Xarelto ONCE DAILY at the same time every day. You may take it either with or without food.  If you have difficulty swallowing the tablet whole, you may crush it and mix in applesauce just prior to taking your dose.  Take Xarelto exactly as prescribed by your doctor and DO NOT stop taking Xarelto without talking to the doctor who prescribed the medication.  Stopping without other VTE prevention medication to take the place of Xarelto may increase your risk of developing a clot.  After discharge, you should have regular check-up appointments with your healthcare provider that is prescribing your Xarelto.    What do you do if you miss a dose? If you miss a dose, take it as soon as you remember on the same day then continue your regularly scheduled once daily regimen the next day. Do not take two doses of Xarelto on the same day.   Important Safety Information A possible side effect of Xarelto is bleeding. You should call your healthcare provider right away if you experience any of the following:   Bleeding from an injury or your nose that does not stop.   Unusual colored urine (red or dark brown) or unusual colored stools (red or black).   Unusual bruising for unknown reasons.   A serious fall or if you  hit your head (even if there is no bleeding).  Some medicines may interact with Xarelto and might increase your risk of bleeding while on Xarelto. To help avoid this, consult your healthcare provider or pharmacist prior to using any new prescription or non-prescription medications, including herbals, vitamins, non-steroidal anti-inflammatory drugs (NSAIDs) and supplements.  This website has more information on Xarelto: [email or web address].[email or web address]

## 2014-04-21 NOTE — Interval H&P Note (Signed)
History and Physical Interval Note:  04/21/2014 8:59 AM  Alexandra Snyder  has presented today for surgery, with the diagnosis of status post compression hip on right with cut out  The various methods of treatment have been discussed with the patient and family. After consideration of risks, benefits and other options for treatment, the patient has consented to  Procedure(s): CONVERSION COMPRESSION HIP SCREW TO TOTAL RIGHT HIP ARTHROPLASTY (Right) as a surgical intervention .  The patient's history has been reviewed, patient examined, no change in status, stable for surgery.  I have reviewed the patient's chart and labs.  Questions were answered to the patient's satisfaction.     Mauri Pole

## 2014-04-21 NOTE — Transfer of Care (Signed)
Immediate Anesthesia Transfer of Care Note  Patient: Alexandra Snyder  Procedure(s) Performed: Procedure(s): CONVERSION COMPRESSION HIP SCREW TO TOTAL RIGHT HIP ARTHROPLASTY (Right)  Patient Location: PACU  Anesthesia Type:General  Level of Consciousness: awake, alert , oriented and patient cooperative  Airway & Oxygen Therapy: Patient Spontanous Breathing and Patient connected to face mask oxygen  Post-op Assessment: Report given to PACU RN and Post -op Vital signs reviewed and stable  Post vital signs: stable  Complications: No apparent anesthesia complications

## 2014-04-21 NOTE — Anesthesia Postprocedure Evaluation (Signed)
  Anesthesia Post-op Note  Patient: Alexandra Snyder  Procedure(s) Performed: Procedure(s) (LRB): CONVERSION COMPRESSION HIP SCREW TO TOTAL RIGHT HIP ARTHROPLASTY (Right)  Patient Location: PACU  Anesthesia Type: General  Level of Consciousness: awake and alert   Airway and Oxygen Therapy: Patient Spontanous Breathing  Post-op Pain: mild  Post-op Assessment: Post-op Vital signs reviewed, Patient's Cardiovascular Status Stable, Respiratory Function Stable, Patent Airway and No signs of Nausea or vomiting  Last Vitals:  Filed Vitals:   04/21/14 1935  BP: 144/80  Pulse: 85  Temp: 36.5 C  Resp: 16    Post-op Vital Signs: stable   Complications: No apparent anesthesia complications

## 2014-04-21 NOTE — Plan of Care (Signed)
Problem: Consults Goal: Diagnosis- Total Joint Replacement Right total hip

## 2014-04-21 NOTE — Anesthesia Preprocedure Evaluation (Signed)
Anesthesia Evaluation  Patient identified by MRN, date of birth, ID band Patient awake    Reviewed: Allergy & Precautions, H&P , NPO status , Patient's Chart, lab work & pertinent test results  History of Anesthesia Complications (+) PONV and history of anesthetic complications  Airway Mallampati: II TM Distance: >3 FB Neck ROM: Full    Dental no notable dental hx.    Pulmonary shortness of breath, asthma , COPD COPD inhaler, former smoker,  breath sounds clear to auscultation  Pulmonary exam normal       Cardiovascular negative cardio ROS  Rhythm:Regular Rate:Normal     Neuro/Psych negative neurological ROS  negative psych ROS   GI/Hepatic negative GI ROS, Neg liver ROS,   Endo/Other  negative endocrine ROS  Renal/GU negative Renal ROS  negative genitourinary   Musculoskeletal negative musculoskeletal ROS (+)   Abdominal   Peds negative pediatric ROS (+)  Hematology  (+) anemia ,   Anesthesia Other Findings   Reproductive/Obstetrics negative OB ROS                           Anesthesia Physical Anesthesia Plan  ASA: II  Anesthesia Plan: General   Post-op Pain Management:    Induction: Intravenous  Airway Management Planned: Oral ETT  Additional Equipment:   Intra-op Plan:   Post-operative Plan: Extubation in OR  Informed Consent: I have reviewed the patients History and Physical, chart, labs and discussed the procedure including the risks, benefits and alternatives for the proposed anesthesia with the patient or authorized representative who has indicated his/her understanding and acceptance.   Dental advisory given  Plan Discussed with: CRNA  Anesthesia Plan Comments:         Anesthesia Quick Evaluation

## 2014-04-21 NOTE — Progress Notes (Signed)
Utilization review completed.  

## 2014-04-22 ENCOUNTER — Encounter (HOSPITAL_COMMUNITY): Payer: Self-pay | Admitting: Orthopedic Surgery

## 2014-04-22 LAB — CBC
HCT: 22.6 % — ABNORMAL LOW (ref 36.0–46.0)
Hemoglobin: 7.5 g/dL — ABNORMAL LOW (ref 12.0–15.0)
MCH: 28.6 pg (ref 26.0–34.0)
MCHC: 33.2 g/dL (ref 30.0–36.0)
MCV: 86.3 fL (ref 78.0–100.0)
PLATELETS: 197 10*3/uL (ref 150–400)
RBC: 2.62 MIL/uL — ABNORMAL LOW (ref 3.87–5.11)
RDW: 15.6 % — AB (ref 11.5–15.5)
WBC: 8.9 10*3/uL (ref 4.0–10.5)

## 2014-04-22 LAB — BASIC METABOLIC PANEL
BUN: 13 mg/dL (ref 6–23)
CALCIUM: 8.8 mg/dL (ref 8.4–10.5)
CO2: 26 mEq/L (ref 19–32)
CREATININE: 0.76 mg/dL (ref 0.50–1.10)
Chloride: 103 mEq/L (ref 96–112)
GFR, EST NON AFRICAN AMERICAN: 79 mL/min — AB (ref >90)
Glucose, Bld: 117 mg/dL — ABNORMAL HIGH (ref 70–99)
Potassium: 4.4 mEq/L (ref 3.7–5.3)
Sodium: 138 mEq/L (ref 137–147)

## 2014-04-22 MED ORDER — GUAIFENESIN ER 600 MG PO TB12
600.0000 mg | ORAL_TABLET | Freq: Two times a day (BID) | ORAL | Status: DC | PRN
  Administered 2014-04-22: 600 mg via ORAL
  Filled 2014-04-22: qty 1

## 2014-04-22 NOTE — Progress Notes (Addendum)
Clinical Social Work Department BRIEF PSYCHOSOCIAL ASSESSMENT 04/22/2014  Patient:  NYELA, CORTINAS     Account Number:  [id number]     Admit date:  04/21/2014  Clinical Social Worker:  Lacie Scotts  Date/Time:  04/22/2014 01:22 PM  Referred by:  Physician  Date Referred:  04/22/2014 Referred for  SNF Placement   Other Referral:   Interview type:  Patient Other interview type:    PSYCHOSOCIAL DATA Living Status:  ALONE Admitted from facility:   Level of care:   Primary support name:  Iantha Fallen Primary support relationship to patient:  FAMILY Degree of support available:   unclear    CURRENT CONCERNS Current Concerns  Post-Acute Placement   Other Concerns:    SOCIAL WORK ASSESSMENT / PLAN Pt is a 80 yr old female living at home prior to hospitalization. This is a planned admission. CSW met with pt to assist with d/c planning needs. Pt will need ST Rehab following hospital d/c. Pt has made prior arrangements to have rehab at [facility] has contacted SNF and d/c plans have been confirmed. CSW will continue to follow to assist with d/c planning to SNF.   Assessment/plan status:  Psychosocial Support/Ongoing Assessment of Needs Other assessment/ plan:   Information/referral to community resources:   Insurance coverage for SNF and Ambulance transport reviewed.    PATIENT'S/FAMILY'S RESPONSE TO PLAN OF CARE: Pt's mood is bright. Pain has been controlled. Pt has been to [facility] in the past for rehab and is looking forward to returning. Pt is motivated to work with therapy.   Werner Lean LCSW [phone]

## 2014-04-22 NOTE — Progress Notes (Signed)
Clinical Social Work Department CLINICAL SOCIAL WORK PLACEMENT NOTE 04/22/2014  Patient:  Alexandra Snyder, Alexandra Snyder  Account Number:  [id number] Admit date:  04/21/2014  Clinical Social Worker:  Werner Lean, LCSW  Date/time:  04/22/2014 01:39 PM  Clinical Social Work is seeking post-discharge placement for this patient at the following level of care:   SKILLED NURSING   (*CSW will update this form in Epic as items are completed)     Patient/family provided with [facility] Department of Clinical Social Work's list of facilities offering this level of care within the geographic area requested by the patient (or if unable, by the patient's family).  04/22/2014  Patient/family informed of their freedom to choose among providers that offer the needed level of care, that participate in Medicare, Medicaid or managed care program needed by the patient, have an available bed and are willing to accept the patient.    Patient/family informed of [facility]' ownership interest in [facility], as well as of the fact that they are under no obligation to receive care at this facility.  PASARR submitted to [employer] on 04/22/2014 PASARR number received from [employer] on 04/22/2014  FL2 transmitted to all facilities in geographic area requested by pt/family on  04/22/2014 FL2 transmitted to all facilities within larger geographic area on   Patient informed that his/her managed care company has contracts with or will negotiate with  certain facilities, including the following:     Patient/family informed of bed offers received:  04/22/2014 Patient chooses bed at [facility] Physician recommends and patient chooses bed at    Patient to be transferred to  on   Patient to be transferred to facility by   The following physician request were entered in Epic:   Additional Comments:  Werner Lean LCSW [phone]

## 2014-04-22 NOTE — Progress Notes (Signed)
Physical Therapy Treatment Patient Details Name: Alexandra Snyder MRN: [id number] DOB: September 22, 1934 Today's Date: 2014/04/25    History of Present Illness CONVERSION COMPRESSION HIP SCREW TO TOTAL RIGHT HIP ARTHROPLASTY     PT Comments    Pt continues pleasant and cooperative but ltd this pm by fatigue.    Follow Up Recommendations  SNF     Equipment Recommendations  None recommended by PT    Recommendations for Other Services OT consult     Precautions / Restrictions Precautions Precautions: Fall Restrictions Weight Bearing Restrictions: Yes RLE Weight Bearing: Touchdown weight bearing Other Position/Activity Restrictions: TDWB    Mobility  Bed Mobility Overal bed mobility: +2 for physical assistance;Needs Assistance Bed Mobility: Sit to Supine       Sit to supine: +2 for physical assistance;Mod assist   General bed mobility comments: cues for sequence and use of L LE to self assist  Transfers Overall transfer level: Needs assistance Equipment used: Rolling walker (2 wheeled) Transfers: Sit to/from Stand Sit to Stand: Min assist;Mod assist;+2 physical assistance         General transfer comment: cues for LE management and use of UEs to self assist  Ambulation/Gait Ambulation/Gait assistance: Mod assist;+2 physical assistance Ambulation Distance (Feet): 6 Feet (and 2') Assistive device: Rolling walker (2 wheeled) Gait Pattern/deviations: Step-to pattern;Decreased step length - right;Decreased step length - left;Shuffle;Trunk flexed Gait velocity: decr   General Gait Details: cues for sequence, posture, stride length, position from RW and TDWB on R; Pt ltd this pm by fatigue   Stairs            Wheelchair Mobility    Modified Rankin (Stroke Patients Only)       Balance                                    Cognition Arousal/Alertness: Awake/alert Behavior During Therapy: WFL for tasks assessed/performed Overall Cognitive Status:  Within Functional Limits for tasks assessed                      Exercises      General Comments        Pertinent Vitals/Pain     Home Living                      Prior Function            PT Goals (current goals can now be found in the care plan section) Acute Rehab PT Goals Patient Stated Goal: go to rehab then home! PT Goal Formulation: With patient Time For Goal Achievement: 04/29/14 Potential to Achieve Goals: Good Progress towards PT goals: Progressing toward goals    Frequency  7X/week    PT Plan Current plan remains appropriate    Co-evaluation             End of Session Equipment Utilized During Treatment: Gait belt Activity Tolerance: Patient limited by fatigue Patient left: in bed;with call bell/phone within reach     Time: 2353-6144 PT Time Calculation (min): 18 min  Charges:  $Gait Training: 8-22 mins                    G Codes:      Mathis Fare 04/25/14, 5:46 PM

## 2014-04-22 NOTE — Evaluation (Signed)
Occupational Therapy Evaluation Patient Details Name: Alexandra Snyder MRN: [id number] DOB: Aug 13, 1934 Today's Date: 04/22/2014    History of Present Illness CONVERSION COMPRESSION HIP SCREW TO TOTAL RIGHT HIP ARTHROPLASTY    Clinical Impression   Pt s/p R THR presents with decreased R LE strength/ROM, post op pain and TDWB limiting ADL and functional mobility. Pt would benefit from follow up rehab at SNF level to maximize IND and safety for return home with ltd assist.      Follow Up Recommendations  SNF    Equipment Recommendations  None recommended by OT    Recommendations for Other Services       Precautions / Restrictions Precautions Precautions: Fall Restrictions Weight Bearing Restrictions: Yes RLE Weight Bearing: Touchdown weight bearing Other Position/Activity Restrictions: TDWB      Mobility Bed Mobility Overal bed mobility: +2 for physical assistance             General bed mobility comments: cues for sequence and use of L LE to self assist  Transfers Overall transfer level: Needs assistance Equipment used: Rolling walker (2 wheeled) Transfers: Sit to/from Stand Sit to Stand: Min assist;Mod assist;+2 physical assistance         General transfer comment: cues for LE management and use of UEs to self assist    Balance                                            ADL Overall ADL's : Needs assistance/impaired Eating/Feeding: Independent;Sitting   Grooming: Set up;Sitting   Upper Body Bathing: Set up;Sitting   Lower Body Bathing: Sitting/lateral leans;Moderate assistance   Upper Body Dressing : Set up;Sitting   Lower Body Dressing: Moderate assistance;Sitting/lateral leans                 General ADL Comments: Pt declined getting on [facility] as she was comfortable in the chair was preparing for her next PT session     Vision                     Perception     Praxis      Pertinent Vitals/Pain      Hand  Dominance Right   Extremity/Trunk Assessment Upper Extremity Assessment Upper Extremity Assessment: Generalized weakness   Lower Extremity Assessment Lower Extremity Assessment: RLE deficits/detail RLE Deficits / Details: Hip strength 2-/5 with AAROM at hip to 70 flex and 15 abd   Cervical / Trunk Assessment Cervical / Trunk Assessment: Normal   Communication Communication Communication: No difficulties   Cognition Arousal/Alertness: Awake/alert Behavior During Therapy: WFL for tasks assessed/performed Overall Cognitive Status: Within Functional Limits for tasks assessed                     General Comments       Exercises Exercises: Total Joint     Shoulder Instructions      Home Living Family/patient expects to be discharged to:: Skilled nursing facility Living Arrangements: Alone                                      Prior Functioning/Environment Level of Independence: Independent with assistive device(s)        Comments: Pt reports she was performing BADLs with AE.  Her family was  assisting with IADLs    OT Diagnosis: Generalized weakness;Acute pain   OT Problem List: Decreased strength;Pain   OT Treatment/Interventions: Self-care/ADL training;DME and/or AE instruction;Patient/family education    OT Goals(Current goals can be found in the care plan section) Acute Rehab OT Goals Patient Stated Goal: go to rehab then home! OT Goal Formulation: With patient Time For Goal Achievement: 04/29/14 ADL Goals Pt Will Perform Grooming: with supervision;standing Pt Will Perform Lower Body Dressing: with supervision;sit to/from stand Pt Will Transfer to Toilet: with supervision;ambulating;regular height toilet Pt Will Perform Toileting - Clothing Manipulation and hygiene: with supervision;sit to/from stand  OT Frequency: Min 2X/week   Barriers to D/C: Decreased caregiver support          Co-evaluation              End of Session  Nurse Communication: Mobility status  Activity Tolerance: Patient tolerated treatment well Patient left: in chair with call bell getting ready to order lunch   Time: 1219-1231 OT Time Calculation (min): 12 min Charges:  OT General Charges $OT Visit: 1 Procedure OT Evaluation $Initial OT Evaluation Tier I: 1 Procedure OT Treatments $Self Care/Home Management : 8-22 mins G-Codes:    Betsy Pries 2014-05-17, 1:02 PM

## 2014-04-22 NOTE — Progress Notes (Signed)
Patient ID: Alexandra Snyder, female   DOB: 08-29-1934, 80 y.o.   MRN: [id number] Subjective: 1 Day Post-Op Procedure(s) (LRB): CONVERSION COMPRESSION HIP SCREW TO TOTAL RIGHT HIP ARTHROPLASTY (Right)    Patient reports pain as mild but has not done much activity as of now No events noted last night  Objective:   VITALS:   Filed Vitals:   04/22/14 0624  BP: 97/67  Pulse: 83  Temp: 98.4 F (36.9 C)  Resp: 16    Neurovascular intact Incision: dressing C/D/I Removed knee immobilizer from surgery  LABS  Recent Labs  04/21/14 0810 04/22/14 0507  HGB 11.7* 7.5*  HCT 34.2* 22.6*  WBC 6.1 8.9  PLT 289 197     Recent Labs  04/21/14 0810 04/22/14 0507  NA 139 138  K 3.6* 4.4  BUN 16 13  CREATININE 0.77 0.76  GLUCOSE 99 117*     Recent Labs  04/21/14 0810  INR 1.04     Assessment/Plan: 1 Day Post-Op Procedure(s) (LRB): CONVERSION COMPRESSION HIP SCREW TO TOTAL RIGHT HIP ARTHROPLASTY (Right)   Up with therapy Discharge to SNF probably Friday  Will monitor her perioperative ABLA If hgb drops tomorrow may consider transfusing

## 2014-04-22 NOTE — Progress Notes (Signed)
Physical Therapy Evaluation Patient Details Name: Alexandra Snyder MRN: [id number] DOB: 1934-03-22 Today's Date: 04/22/2014   History of Present Illness  CONVERSION COMPRESSION HIP SCREW TO TOTAL RIGHT HIP ARTHROPLASTY   Clinical Impression  Pt s/p R THR presents with decreased R LE strength/ROM, post op pain and TDWB limiting functional mobility.  Pt would benefit from follow up rehab at SNF level to maximize IND and safety for return home with ltd assist.    Follow Up Recommendations SNF    Equipment Recommendations  None recommended by PT    Recommendations for Other Services OT consult     Precautions / Restrictions Precautions Precautions: Fall Restrictions Weight Bearing Restrictions: Yes RLE Weight Bearing: Touchdown weight bearing Other Position/Activity Restrictions: TDWB      Mobility  Bed Mobility Overal bed mobility: +2 for physical assistance             General bed mobility comments: cues for sequence and use of L LE to self assist  Transfers Overall transfer level: Needs assistance Equipment used: Rolling walker (2 wheeled) Transfers: Sit to/from Stand Sit to Stand: Min assist;Mod assist;+2 physical assistance         General transfer comment: cues for LE management and use of UEs to self assist  Ambulation/Gait Ambulation/Gait assistance: +2 physical assistance;Min assist;Mod assist Ambulation Distance (Feet): 21 Feet Assistive device: Rolling walker (2 wheeled) Gait Pattern/deviations: Step-to pattern;Decreased step length - right;Decreased step length - left;Shuffle;Antalgic     General Gait Details: cues for sequence, posture, stride length, position from RW and TDWB on R  Stairs            Wheelchair Mobility    Modified Rankin (Stroke Patients Only)       Balance                                             Pertinent Vitals/Pain 4/10; premed, ice pack provided    Home Living Family/patient expects to  be discharged to:: Skilled nursing facility Living Arrangements: Alone                    Prior Function Level of Independence: Independent with assistive device(s)         Comments: Pt reports she was performing BADLs with AE.  Her family was assisting with IADLs     Hand Dominance   Dominant Hand: Right    Extremity/Trunk Assessment   Upper Extremity Assessment: Generalized weakness           Lower Extremity Assessment: RLE deficits/detail RLE Deficits / Details: Hip strength 2-/5 with AAROM at hip to 70 flex and 15 abd    Cervical / Trunk Assessment: Normal  Communication   Communication: No difficulties  Cognition Arousal/Alertness: Awake/alert Behavior During Therapy: WFL for tasks assessed/performed Overall Cognitive Status: Within Functional Limits for tasks assessed                      General Comments      Exercises Total Joint Exercises Ankle Circles/Pumps: AROM;Both;15 reps;Supine Quad Sets: AROM;Both;10 reps;Supine Heel Slides: AAROM;Right;15 reps;Supine Hip ABduction/ADduction: AAROM;Right;10 reps;Supine      Assessment/Plan    PT Assessment Patient needs continued PT services  PT Diagnosis Difficulty walking   PT Problem List Decreased strength;Decreased range of motion;Decreased activity tolerance;Decreased balance;Decreased mobility;Decreased knowledge of use of DME;Pain;Decreased knowledge  of precautions  PT Treatment Interventions DME instruction;Gait training;Stair training;Therapeutic exercise;Therapeutic activities;Functional mobility training;Patient/family education   PT Goals (Current goals can be found in the Care Plan section) Acute Rehab PT Goals Patient Stated Goal: go to rehab then home! PT Goal Formulation: With patient Time For Goal Achievement: 04/29/14 Potential to Achieve Goals: Good    Frequency 7X/week   Barriers to discharge        Co-evaluation               End of Session Equipment  Utilized During Treatment: Gait belt Activity Tolerance: Patient tolerated treatment well Patient left: in chair;with call bell/phone within reach Nurse Communication: Mobility status         Time: 0350-0938 PT Time Calculation (min): 32 min   Charges:   PT Evaluation $Initial PT Evaluation Tier I: 1 Procedure PT Treatments $Gait Training: 8-22 mins $Therapeutic Exercise: 8-22 mins   PT G Codes:          Mathis Fare 04/22/2014, 12:33 PM

## 2014-04-23 LAB — CBC
HCT: 21.7 % — ABNORMAL LOW (ref 36.0–46.0)
Hemoglobin: 7.1 g/dL — ABNORMAL LOW (ref 12.0–15.0)
MCH: 28.2 pg (ref 26.0–34.0)
MCHC: 32.7 g/dL (ref 30.0–36.0)
MCV: 86.1 fL (ref 78.0–100.0)
PLATELETS: 168 10*3/uL (ref 150–400)
RBC: 2.52 MIL/uL — ABNORMAL LOW (ref 3.87–5.11)
RDW: 15.8 % — ABNORMAL HIGH (ref 11.5–15.5)
WBC: 12.4 10*3/uL — ABNORMAL HIGH (ref 4.0–10.5)

## 2014-04-23 LAB — BASIC METABOLIC PANEL
BUN: 13 mg/dL (ref 6–23)
CO2: 23 mEq/L (ref 19–32)
Calcium: 8.7 mg/dL (ref 8.4–10.5)
Chloride: 101 mEq/L (ref 96–112)
Creatinine, Ser: 0.73 mg/dL (ref 0.50–1.10)
GFR, EST NON AFRICAN AMERICAN: 80 mL/min — AB (ref >90)
Glucose, Bld: 122 mg/dL — ABNORMAL HIGH (ref 70–99)
POTASSIUM: 3.9 meq/L (ref 3.7–5.3)
SODIUM: 134 meq/L — AB (ref 137–147)

## 2014-04-23 LAB — PREPARE RBC (CROSSMATCH)

## 2014-04-23 MED ORDER — FUROSEMIDE 10 MG/ML IJ SOLN
10.0000 mg | Freq: Once | INTRAMUSCULAR | Status: AC
  Administered 2014-04-23: 10 mg via INTRAVENOUS
  Filled 2014-04-23: qty 1

## 2014-04-23 NOTE — Progress Notes (Signed)
   Subjective: 2 Days Post-Op Procedure(s) (LRB): CONVERSION COMPRESSION HIP SCREW TO TOTAL RIGHT HIP ARTHROPLASTY (Right) Patient reports pain as mild.   Patient seen in rounds for Dr. Alvan Dame. Patient is well, but has had some minor complaints of pain in the hip, requiring pain medications Plan is to go Skilled nursing facility after hospital stay.  Objective: Vital signs in last 24 hours: Temp:  [99.2 F (37.3 C)-102.3 F (39.1 C)] 99.3 F (37.4 C) (04/30 0719) Pulse Rate:  [102-118] 109 (04/30 0547) Resp:  [16-21] 18 (04/30 0547) BP: (109-162)/(64-83) 124/74 mmHg (04/29 2115) SpO2:  [95 %-100 %] 96 % (04/30 0547)  Intake/Output from previous day:  Intake/Output Summary (Last 24 hours) at 04/23/14 0841 Last data filed at 04/23/14 0600  Gross per 24 hour  Intake    700 ml  Output   1550 ml  Net   -850 ml    Labs:  Recent Labs  04/21/14 0810 04/22/14 0507 04/23/14 0440  HGB 11.7* 7.5* 7.1*    Recent Labs  04/22/14 0507 04/23/14 0440  WBC 8.9 12.4*  RBC 2.62* 2.52*  HCT 22.6* 21.7*  PLT 197 168    Recent Labs  04/22/14 0507 04/23/14 0440  NA 138 134*  K 4.4 3.9  CL 103 101  CO2 26 23  BUN 13 13  CREATININE 0.76 0.73  GLUCOSE 117* 122*  CALCIUM 8.8 8.7    Recent Labs  04/21/14 0810  INR 1.04    EXAM General - Patient is Alert, Appropriate and Oriented Extremity - Neurovascular intact Sensation intact distally Dorsiflexion/Plantar flexion intact Dressing/Incision - clean, dry, no drainage Motor Function - intact, moving foot and toes well on exam.   Past Medical History  Diagnosis Date  . Breast cancer     left  . Asthma   . COPD (chronic obstructive pulmonary disease)   . History of radiation therapy 05/27/2013-06/23/2013    50 Gy to left breast  . Hypercholesteremia   . Shortness of breath   . Arthritis     hips  . PONV (postoperative nausea and vomiting)   . Neuropathy     feet  . History of transfusion     post op  .  Difficulty sleeping   . History of nonmelanoma skin cancer     Assessment/Plan: 2 Days Post-Op Procedure(s) (LRB): CONVERSION COMPRESSION HIP SCREW TO TOTAL RIGHT HIP ARTHROPLASTY (Right) Active Problems:   S/P total hip arthroplasty  Estimated body mass index is 23.06 kg/(m^2) as calculated from the following:   Height as of this encounter: 5\' 1"  (1.549 m).   Weight as of this encounter: 55.339 kg (122 lb). Up with therapy Plan for discharge tomorrow Discharge to SNF  DVT Prophylaxis - Xarelto TDWB to right leg HGB down to 7.1.  Will give blood today and recheck labs in the morning. Plan for SNF tomorrow.  Arlee Muslim, PA-C Orthopaedic Surgery 04/23/2014, 8:41 AM

## 2014-04-23 NOTE — Progress Notes (Signed)
Physical Therapy Treatment Patient Details Name: Alexandra Snyder MRN: [id number] DOB: [birth year] Today's Date: 04/23/2014    History of Present Illness CONVERSION COMPRESSION HIP SCREW TO TOTAL RIGHT HIP ARTHROPLASTY     PT Comments      Follow Up Recommendations  SNF     Equipment Recommendations  None recommended by PT    Recommendations for Other Services OT consult     Precautions / Restrictions Precautions Precautions: Fall;Posterior Hip Precaution Booklet Issued: Yes (comment) Precaution Comments: Sign hung in room and all post THP reviewed Restrictions Weight Bearing Restrictions: Yes RLE Weight Bearing: Touchdown weight bearing Other Position/Activity Restrictions: TDWB    Mobility  Bed Mobility Overal bed mobility: +2 for physical assistance;Needs Assistance Bed Mobility: Supine to Sit;Sit to Supine     Supine to sit: +2 for physical assistance;Mod assist Sit to supine: +2 for physical assistance;Mod assist   General bed mobility comments: cues for sequence and use of L LE to self assist  Transfers Overall transfer level: Needs assistance Equipment used: Rolling walker (2 wheeled) Transfers: Sit to/from Stand Sit to Stand: Min assist;Mod assist;+2 physical assistance         General transfer comment: cues for LE management and use of UEs to self assist  Ambulation/Gait Ambulation/Gait assistance: Mod assist;+2 physical assistance;+2 safety/equipment Ambulation Distance (Feet): 28 Feet Assistive device: Rolling walker (2 wheeled) Gait Pattern/deviations: Step-to pattern;Decreased step length - right;Decreased step length - left;Shuffle;Antalgic;Trunk flexed Gait velocity: decr   General Gait Details: cues for sequence, posture, stride length, position from RW and TDWB on R; Pt ltd this pm by fatigue   Stairs            Wheelchair Mobility    Modified Rankin (Stroke Patients Only)       Balance                                    Cognition Arousal/Alertness: Awake/alert Behavior During Therapy: WFL for tasks assessed/performed Overall Cognitive Status: Within Functional Limits for tasks assessed                      Exercises Total Joint Exercises Ankle Circles/Pumps: AROM;Both;15 reps;Supine Quad Sets: AROM;Both;Supine;15 reps Heel Slides: AAROM;Right;Supine;20 reps Hip ABduction/ADduction: AAROM;Right;Supine;15 reps    General Comments        Pertinent Vitals/Pain 4/10; ice pack provided; pt declines pain meds at this time    Home Living                      Prior Function            PT Goals (current goals can now be found in the care plan section) Acute Rehab PT Goals Patient Stated Goal: go to rehab then home! PT Goal Formulation: With patient Time For Goal Achievement: 04/29/14 Potential to Achieve Goals: Good Progress towards PT goals: Progressing toward goals    Frequency  7X/week    PT Plan Current plan remains appropriate    Co-evaluation             End of Session Equipment Utilized During Treatment: Gait belt Activity Tolerance: Patient tolerated treatment well;Patient limited by fatigue Patient left: in bed;with call bell/phone within reach     Time: 0174-9449 PT Time Calculation (min): 40 min  Charges:  $Gait Training: 8-22 mins $Therapeutic Exercise: 8-22 mins $Therapeutic Activity: 8-22 mins  G Codes:      Mathis Fare 26-Apr-2014, 4:09 PM

## 2014-04-23 NOTE — Op Note (Signed)
[name], MARCHESI NO.:  [id number]  MEDICAL RECORD NO.:  [id number]  LOCATION:  48                         FACILITY:  [facility]  PHYSICIAN:  Pietro Cassis. Alvan Dame, M.D.  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  PROCEDURE:  Conversion of failed right hip surgery to right total hip arthroplasty.  COMPONENTS USED:  DePuy hip system with size 50-mm Pinnacle shell, cancellous screws and combination of autologous and allograft bone grafting.  A 32+ 4 neutral liner, and size 15 small stature AML stem with 32+ 1 metal ball.  SURGEON:  Pietro Cassis. Alvan Dame, M.D.  ASSISTANT:  Judith Part. Chabon, P.A.  ANESTHESIA:  General.  SPECIMENS:  None.  COMPLICATION:  None.  BLOOD LOSS:  Probably about 400 mL.  DRAINS:  None.  INDICATIONS FOR PROCEDURE:  Alexandra Snyder is a pleasant 80 year old female with recent history of right intertrochanteric femur fracture.  This was performed in approximately November of 2014.  She had been seen and evaluated in the office and had been noted to be healing without complication at her last radiographic evaluation.  At that time, she was instructed to come back on an as-needed basis for any pain or complications of further fall.  She was seen most recently in the office, noted increasing pain and dysfunction.  In this region, radiographs were ordered revealing distal displacement of her intertrochanteric fracture with cutout of lag screw into the acetabulum. Due to the obvious failure, nonunion and cutout as well as increasing pain, she wished to proceed with surgical intervention.  Risks, benefits, and  necessities were discussed.  Risks of infection, risk of hardware failure, need for future surgeries were reviewed particularly in the setting.  The postoperative course was reviewed as anticipated. Consent was obtained for benefit of pain relief.  PROCEDURE IN DETAIL:  The patient was brought to the operative theater. Once adequate anesthesia, preoperative antibiotics, Ancef administered, she was positioned into the left lateral decubitus position with right side up.  The right lower extremity was then prepped and draped in sterile fashion.  Time-out was performed identifying the patient, planned procedure, and extremity.  At this point, the incision was made utilizing old incision, but extended for posterior approach to the hip.  Soft tissue planes were created down to the iliotibial band and gluteal fascia.  This was exposed for posterior approach.  Upon exposure of the posterior aspect of the hip, the short external rotators were taken down as well as the posterior capsule from the posterior aspect of the hip.  I then identified the lag screw quite readily and placed the screwdriver for the lag screw on the lag screw. With this in place with into the proximal aspect of the hip at the trochanter and identified the nail that was prominent.  With the nail visible, the proximal locking bolt was back to turn to allow for  lag screw removal.  We then inserted the nail, removing the device at this point and then removed the lag screw.  Once this was in place, we identified the location of the distally placed locking screw and removed this.  Once the screw was removed, the nail was easily removed.  At this point, utilizing a bone hook, I carefully dislocated the hip. With the hip exposed posteriorly using oscillating saw, I created a neck osteotomy into the trochanteric fossa.  There was gross motion of the fracture segments at this point.  Following further exposure to allow for  mobilization of the femur, the femur was retracted anteriorly and attention was first directed to the acetabulum.  With retractors placed and labrum and foveal tissue removed, I began reaming with a 45 reamer and reamed up to a 49 mm reamer, selecting a 50 mm cup.  There was noted to be expected radiographs, a large defect in the ileum that was contained curetted out any fibrous tissue from this area, then packed at the femoral head, which was ground down with reamer as well as combined with 10 mL of cancellous bone chips.  I then reversed reamed the acetabulum to clear of any debris.  The final 50 mm cup was then impacted at approximately 20 degrees to 25 degrees of forward flexion, 40 degrees of abduction.  Two cancellous screws were used to support the initial fixation.  At this point, a trial liner was placed, then attention was directed to the femur.  The femur was then flexed and internally rotated.  The proximal femur was opened with a drill.  Canal finer was utilized to confirm location of the canal.  Given the noted known union of the proximal femur, I selected to use a fully porous-coated type stem to bypass this area as well as the distal interlock.  Following the canal identification, we knew that we were released, going to be higher than 11 mm due to the intramedullary nail previous placed. I began reaming with a 10-mm reamer and then reamed up to 14.5 mm where I had got good purchase on the bone at the isthmus region.  We then began broach with a size 12 small body, small stature broach, then a 13.5, then a 15.  With the 15 broach in place with about 20 degrees of anteversion, the trial reduction was carried out.  With this, I felt based on the contralateral extremity and the position of this that the leg lengths appeared equal.  There were no evidence of impingement or subluxation with flexion, internal rotation, or with extension.  Given these findings, I dislocated  the head, removed the trial femoral component.  After retracting the femur anteriorly, again I removed the trial liner, placed a hole eliminator, and then selected the 32+ 4 neutral liner, which was impacted with good secure visualized rim fit.  At this point, the femur was flexed and internally rotated.  Again, retractor was placed.  The final size 15 small stature AML stem was opened and impacted, maintained an anteversion using an intramedullary rod into the insertion hole.  Once the femoral component sat to the level where we had broached and also reamed, we re-did the trial and again selected the 32+ 1 ball.  The final metal ball was impacted, cleaned and dry trunnion and the hip reduced.  After irrigating the wound again, we reapproximated the pseudocapsule posteriorly.  I then reapproximated the iliotibial band and gluteal fascia using a combination of #1 Vicryl  and #0 V-Loc suture.  The remainder of wound was closed with 2-0 Vicryl and a running 4-0 Monocryl.  The hip was then cleaned, dried and dressed sterilely using Dermabond and Aquacel dressing.  Following this, the patient was extubated, placed into a supine position with the knee immobilizer in place due to contracture of the hip.  She was then brought to the recovery room in stable condition, tolerating the procedure well. Findings were reviewed with the family.  Postoperatively, she will be partial weightbearing to allow for bony healing.  I will see her back in office for routine follow up and review findings.     Pietro Cassis Alvan Dame, M.D.     MDO/MEDQ  D:  04/23/2014  T:  04/23/2014  Job:  [id number]

## 2014-04-23 NOTE — Brief Op Note (Signed)
04/21/2014  12:38 AM  PATIENT:  Alexandra Snyder  80 y.o. female  PRE-OPERATIVE DIAGNOSIS:  status post compression hip on right with cut out  POST-OPERATIVE DIAGNOSIS:  status post compression hip on right with cut out, failed previous right hip surgery, nonunion right intertrochanteric femur fracture  PROCEDURE:  Procedure(s): CONVERSION COMPRESSION HIP SCREW TO TOTAL RIGHT HIP ARTHROPLASTY (Right)  SURGEON:  Surgeon(s) and Role:    * Mauri Pole, MD - Primary  PHYSICIAN ASSISTANT: Molli Barrows, PA-C  ANESTHESIA:   general  EBL:  Total I/O In: -  Out: 150 [Urine:150] EBL:  400cc  BLOOD ADMINISTERED:none  DRAINS: none   LOCAL MEDICATIONS USED:  NONE  SPECIMEN:  No Specimen  DISPOSITION OF SPECIMEN:  N/A  COUNTS:  YES  TOURNIQUET:  * No tourniquets in log *  DICTATION: .Other Dictation: Dictation Number [id number]  PLAN OF CARE: Admit to inpatient   PATIENT DISPOSITION:  PACU - hemodynamically stable.   Delay start of Pharmacological VTE agent (>24hrs) due to surgical blood loss or risk of bleeding: no

## 2014-04-24 LAB — BASIC METABOLIC PANEL
BUN: 12 mg/dL (ref 6–23)
CHLORIDE: 98 meq/L (ref 96–112)
CO2: 26 mEq/L (ref 19–32)
Calcium: 9.1 mg/dL (ref 8.4–10.5)
Creatinine, Ser: 0.66 mg/dL (ref 0.50–1.10)
GFR calc non Af Amer: 82 mL/min — ABNORMAL LOW (ref >90)
Glucose, Bld: 108 mg/dL — ABNORMAL HIGH (ref 70–99)
POTASSIUM: 4.1 meq/L (ref 3.7–5.3)
SODIUM: 134 meq/L — AB (ref 137–147)

## 2014-04-24 LAB — CBC
HCT: 28.5 % — ABNORMAL LOW (ref 36.0–46.0)
HEMOGLOBIN: 9.9 g/dL — AB (ref 12.0–15.0)
MCH: 29.9 pg (ref 26.0–34.0)
MCHC: 35.4 g/dL (ref 30.0–36.0)
MCV: 84.3 fL (ref 78.0–100.0)
Platelets: 165 10*3/uL (ref 150–400)
RBC: 3.38 MIL/uL — ABNORMAL LOW (ref 3.87–5.11)
RDW: 15.5 % (ref 11.5–15.5)
WBC: 10.6 10*3/uL — AB (ref 4.0–10.5)

## 2014-04-24 LAB — TYPE AND SCREEN
ABO/RH(D): O POS
ANTIBODY SCREEN: NEGATIVE
Unit division: 0
Unit division: 0

## 2014-04-24 MED ORDER — MUPIROCIN 2 % EX OINT: TOPICAL_OINTMENT | Freq: Two times a day (BID) | CUTANEOUS | Status: AC

## 2014-04-24 MED ORDER — ALUM & MAG HYDROXIDE-SIMETH 200-200-20 MG/5ML PO SUSP: 30.0000 mL | ORAL | Status: AC | PRN

## 2014-04-24 MED ORDER — DSS 100 MG PO CAPS: 100.0000 mg | ORAL_CAPSULE | Freq: Two times a day (BID) | ORAL | Status: AC

## 2014-04-24 MED ORDER — POLYETHYLENE GLYCOL 3350 17 G PO PACK: 17.0000 g | PACK | Freq: Every day | ORAL | Status: AC | PRN

## 2014-04-24 MED ORDER — GUAIFENESIN ER 600 MG PO TB12: 600.0000 mg | ORAL_TABLET | Freq: Two times a day (BID) | ORAL | Status: AC | PRN

## 2014-04-24 MED ORDER — DIPHENHYDRAMINE HCL 12.5 MG/5ML PO ELIX: 25.0000 mg | ORAL_SOLUTION | Freq: Four times a day (QID) | ORAL | Status: DC | PRN

## 2014-04-24 MED ORDER — SENNA 8.6 MG PO TABS: 1.0000 | ORAL_TABLET | Freq: Two times a day (BID) | ORAL | Status: AC

## 2014-04-24 MED ORDER — RIVAROXABAN 10 MG PO TABS: 10.0000 mg | ORAL_TABLET | Freq: Every day | ORAL | Status: DC

## 2014-04-24 MED ORDER — METHOCARBAMOL 500 MG PO TABS: 500.0000 mg | ORAL_TABLET | Freq: Four times a day (QID) | ORAL | Status: DC | PRN

## 2014-04-24 MED ORDER — MENTHOL 3 MG MT LOZG: 1.0000 | LOZENGE | OROMUCOSAL | Status: AC | PRN

## 2014-04-24 MED ORDER — FERROUS SULFATE 325 (65 FE) MG PO TABS: 325.0000 mg | ORAL_TABLET | Freq: Three times a day (TID) | ORAL | Status: AC

## 2014-04-24 MED ORDER — HYDROCODONE-ACETAMINOPHEN 7.5-325 MG PO TABS: 1.0000 | ORAL_TABLET | ORAL | Status: DC

## 2014-04-24 NOTE — Progress Notes (Signed)
Patient ID: Alexandra Snyder, female   DOB: 04-13-1934, 80 y.o.   MRN: [id number] Subjective: 3 Days Post-Op Procedure(s) (LRB): CONVERSION COMPRESSION HIP SCREW TO TOTAL RIGHT HIP ARTHROPLASTY (Right)    Patient reports pain as mild.  Some lateral hip discomfort  Objective:   VITALS:   Filed Vitals:   04/24/14 0504  BP: 130/80  Pulse: 89  Temp: 100.3 F (37.9 C)  Resp: 18    Neurovascular intact Incision: dressing C/D/I  LABS  Recent Labs  04/22/14 0507 04/23/14 0440 04/24/14 0405  HGB 7.5* 7.1* 9.9*  HCT 22.6* 21.7* 28.5*  WBC 8.9 12.4* 10.6*  PLT 197 168 165     Recent Labs  04/22/14 0507 04/23/14 0440 04/24/14 0405  NA 138 134* 134*  K 4.4 3.9 4.1  BUN 13 13 12   CREATININE 0.76 0.73 0.66  GLUCOSE 117* 122* 108*    No results found for this basename: LABPT, INR,  in the last 72 hours   Assessment/Plan: 3 Days Post-Op Procedure(s) (LRB): CONVERSION COMPRESSION HIP SCREW TO TOTAL RIGHT HIP ARTHROPLASTY (Right)   Discharge to SNF today

## 2014-04-24 NOTE — Care Management Note (Signed)
    Page 1 of 1   04/24/2014     3:39:05 PM CARE MANAGEMENT NOTE 04/24/2014  Patient:  Alexandra Snyder, Alexandra Snyder   Account Number:  [id number]  Date Initiated:  04/24/2014  Documentation initiated by:  Dessa Phi  Subjective/Objective Assessment:   80 Y/O F ADMITTED W/THA.     Action/Plan:   FROM HOME.   Anticipated DC Date:  04/24/2014   Anticipated DC Plan:  [place]  CM consult      Choice offered to / List presented to:             Status of service:  Completed, signed off Medicare Important Message given?   (If response is "NO", the following Medicare IM given date fields will be blank) Date Medicare IM given:  04/24/2014 Date Additional Medicare IM given:  04/24/2014  Discharge Disposition:  [place]  Per UR Regulation:  Reviewed for med. necessity/level of care/duration of stay  If discussed at [place] of Stay Meetings, dates discussed:    Comments:

## 2014-04-24 NOTE — Discharge Summary (Signed)
Physician Discharge Summary   Patient ID: Alexandra Snyder MRN: [id number] DOB/AGE: 1934-03-02 80 y.o.  Admit date: 04/21/2014 Discharge date: 04/24/2014  Primary Diagnosis: Osteoarthritis, right hip   Admission Diagnoses:  Past Medical History  Diagnosis Date  . Breast cancer     left  . Asthma   . COPD (chronic obstructive pulmonary disease)   . History of radiation therapy 05/27/2013-06/23/2013    50 Gy to left breast  . Hypercholesteremia   . Shortness of breath   . Arthritis     hips  . PONV (postoperative nausea and vomiting)   . Neuropathy     feet  . History of transfusion     post op  . Difficulty sleeping   . History of nonmelanoma skin cancer    Discharge Diagnoses:   Active Problems:   S/P total hip arthroplasty  Estimated body mass index is 23.06 kg/(m^2) as calculated from the following:   Height as of this encounter: _0  (1.549 m).   Weight as of this encounter: 55.339 kg (122 lb).  Procedure:  Procedure(s) (LRB): CONVERSION COMPRESSION HIP SCREW TO TOTAL RIGHT HIP ARTHROPLASTY (Right)   Consults: None  HPI: Alexandra Snyder, 80 y.o. female, complaining Alexandra Snyder comes in today for an evaluation of right hip pain. Three weeks ago she started having more increased pain in her right hip. She is five months out from a compression hip screw of her right intertrochanteric fracture. The last X-ray she had in January showed her fracture to be in good position and alignment and the screw to be intraosseous. She has decreased range of motion. There is pain with just about any motion of her right hip. Sensation and circulation are intact. Most recent x-rays show the screw to have cut out the femoral head and to be impinging in the acetabular region. Her fracture has started shifting. She will need removal of her hardware and conversion to a total hip arthroplasty. She understands the issues and procedure. Risks, benefits and expectations were discussed with the patient. Risks  including but not limited to the risk of anesthesia, blood clots, nerve damage, blood vessel damage, failure of the prosthesis, infection and up to and including death. Patient understand the risks, benefits and expectations and wishes to proceed with surgery.      Laboratory Data: Admission on 04/21/2014  Component Date Value Ref Range Status  . MRSA, PCR 04/21/2014 POSITIVE* NEGATIVE Final   Comment: RESULT CALLED TO, READ BACK BY AND VERIFIED WITH:                          L. CAPBELL RN AT [id number] ON 04.2815 BY SHUEA  . Staphylococcus aureus 04/21/2014 POSITIVE* NEGATIVE Final   Comment:                                 The Xpert SA Assay (FDA                          approved for NASAL specimens                          in patients over 65 years of age),                          is one  component of                          a comprehensive surveillance                          program.  Test performance has                          been validated by [facility] for patients greater                          than or equal to 80 year old.                          It is not intended                          to diagnose infection nor to                          guide or monitor treatment.  . WBC 04/21/2014 6.1  4.0 - 10.5 K/uL Final  . RBC 04/21/2014 4.04  3.87 - 5.11 MIL/uL Final  . Hemoglobin 04/21/2014 11.7* 12.0 - 15.0 g/dL Final  . HCT 04/21/2014 34.2* 36.0 - 46.0 % Final  . MCV 04/21/2014 84.7  78.0 - 100.0 fL Final  . MCH 04/21/2014 29.0  26.0 - 34.0 pg Final  . MCHC 04/21/2014 34.2  30.0 - 36.0 g/dL Final  . RDW 04/21/2014 15.3  11.5 - 15.5 % Final  . Platelets 04/21/2014 289  150 - 400 K/uL Final  . Sodium 04/21/2014 139  137 - 147 mEq/L Final  . Potassium 04/21/2014 3.6* 3.7 - 5.3 mEq/L Final  . Chloride 04/21/2014 98  96 - 112 mEq/L Final  . CO2 04/21/2014 27  19 - 32 mEq/L Final  . Glucose, Bld 04/21/2014 99  70 - 99 mg/dL Final  . BUN 04/21/2014  16  6 - 23 mg/dL Final  . Creatinine, Ser 04/21/2014 0.77  0.50 - 1.10 mg/dL Final  . Calcium 04/21/2014 10.7* 8.4 - 10.5 mg/dL Final  . GFR calc non Af Amer 04/21/2014 78* >90 mL/min Final  . GFR calc Af Amer 04/21/2014 >90  >90 mL/min Final   Comment: (NOTE)                          The eGFR has been calculated using the CKD EPI equation.                          This calculation has not been validated in all clinical situations.                          eGFR's persistently <90 mL/min signify possible Chronic Kidney                          Disease.  Marland Kitchen Prothrombin Time 04/21/2014 13.4  11.6 - 15.2 seconds Final  . INR 04/21/2014 1.04  0.00 -  1.49 Final  . aPTT 04/21/2014 35  24 - 37 seconds Final  . ABO/RH(D) 04/21/2014 O POS   Final  . Antibody Screen 04/21/2014 NEG   Final  . Sample Expiration 04/21/2014 04/24/2014   Final  . Unit Number 04/21/2014 [id number]   Final  . Blood Component Type 04/21/2014 RBC LR PHER1   Final  . Unit division 04/21/2014 00   Final  . Status of Unit 04/21/2014 ISSUED,FINAL   Final  . Transfusion Status 04/21/2014 OK TO TRANSFUSE   Final  . Crossmatch Result 04/21/2014 Compatible   Final  . Unit Number 04/21/2014 [id number]   Final  . Blood Component Type 04/21/2014 RBC LR PHER2   Final  . Unit division 04/21/2014 00   Final  . Status of Unit 04/21/2014 ISSUED,FINAL   Final  . Transfusion Status 04/21/2014 OK TO TRANSFUSE   Final  . Crossmatch Result 04/21/2014 Compatible   Final  . Color, Urine 04/21/2014 YELLOW  YELLOW Final  . APPearance 04/21/2014 CLEAR  CLEAR Final  . Specific Gravity, Urine 04/21/2014 1.009  1.005 - 1.030 Final  . pH 04/21/2014 7.0  5.0 - 8.0 Final  . Glucose, UA 04/21/2014 NEGATIVE  NEGATIVE mg/dL Final  . Hgb urine dipstick 04/21/2014 NEGATIVE  NEGATIVE Final  . Bilirubin Urine 04/21/2014 NEGATIVE  NEGATIVE Final  . Ketones, ur 04/21/2014 NEGATIVE  NEGATIVE mg/dL Final  . Protein, ur 04/21/2014 NEGATIVE  NEGATIVE mg/dL  Final  . Urobilinogen, UA 04/21/2014 0.2  0.0 - 1.0 mg/dL Final  . Nitrite 04/21/2014 NEGATIVE  NEGATIVE Final  . Leukocytes, UA 04/21/2014 NEGATIVE  NEGATIVE Final  . RBC / HPF 04/21/2014 0-2  <3 RBC/hpf Final  . WBC 04/22/2014 8.9  4.0 - 10.5 K/uL Final  . RBC 04/22/2014 2.62* 3.87 - 5.11 MIL/uL Final  . Hemoglobin 04/22/2014 7.5* 12.0 - 15.0 g/dL Final   Comment: DELTA CHECK NOTED                          REPEATED TO VERIFY  . HCT 04/22/2014 22.6* 36.0 - 46.0 % Final  . MCV 04/22/2014 86.3  78.0 - 100.0 fL Final  . MCH 04/22/2014 28.6  26.0 - 34.0 pg Final  . MCHC 04/22/2014 33.2  30.0 - 36.0 g/dL Final  . RDW 04/22/2014 15.6* 11.5 - 15.5 % Final  . Platelets 04/22/2014 197  150 - 400 K/uL Final   Comment: DELTA CHECK NOTED                          REPEATED TO VERIFY  . Sodium 04/22/2014 138  137 - 147 mEq/L Final  . Potassium 04/22/2014 4.4  3.7 - 5.3 mEq/L Final   Comment: NO VISIBLE HEMOLYSIS                          DELTA CHECK NOTED  . Chloride 04/22/2014 103  96 - 112 mEq/L Final  . CO2 04/22/2014 26  19 - 32 mEq/L Final  . Glucose, Bld 04/22/2014 117* 70 - 99 mg/dL Final  . BUN 04/22/2014 13  6 - 23 mg/dL Final  . Creatinine, Ser 04/22/2014 0.76  0.50 - 1.10 mg/dL Final  . Calcium 04/22/2014 8.8  8.4 - 10.5 mg/dL Final  . GFR calc non Af Amer 04/22/2014 79* >90 mL/min Final  . GFR calc Af Amer 04/22/2014 >90  >90 mL/min Final  Comment: (NOTE)                          The eGFR has been calculated using the CKD EPI equation.                          This calculation has not been validated in all clinical situations.                          eGFR's persistently <90 mL/min signify possible Chronic Kidney                          Disease.  . WBC 04/23/2014 12.4* 4.0 - 10.5 K/uL Final  . RBC 04/23/2014 2.52* 3.87 - 5.11 MIL/uL Final  . Hemoglobin 04/23/2014 7.1* 12.0 - 15.0 g/dL Final  . HCT 04/23/2014 21.7* 36.0 - 46.0 % Final  . MCV 04/23/2014 86.1  78.0 - 100.0 fL Final   . MCH 04/23/2014 28.2  26.0 - 34.0 pg Final  . MCHC 04/23/2014 32.7  30.0 - 36.0 g/dL Final  . RDW 04/23/2014 15.8* 11.5 - 15.5 % Final  . Platelets 04/23/2014 168  150 - 400 K/uL Final  . Sodium 04/23/2014 134* 137 - 147 mEq/L Final  . Potassium 04/23/2014 3.9  3.7 - 5.3 mEq/L Final  . Chloride 04/23/2014 101  96 - 112 mEq/L Final  . CO2 04/23/2014 23  19 - 32 mEq/L Final  . Glucose, Bld 04/23/2014 122* 70 - 99 mg/dL Final  . BUN 04/23/2014 13  6 - 23 mg/dL Final  . Creatinine, Ser 04/23/2014 0.73  0.50 - 1.10 mg/dL Final  . Calcium 04/23/2014 8.7  8.4 - 10.5 mg/dL Final  . GFR calc non Af Amer 04/23/2014 80* >90 mL/min Final  . GFR calc Af Amer 04/23/2014 >90  >90 mL/min Final   Comment: (NOTE)                          The eGFR has been calculated using the CKD EPI equation.                          This calculation has not been validated in all clinical situations.                          eGFR's persistently <90 mL/min signify possible Chronic Kidney                          Disease.  . Order Confirmation 04/23/2014 ORDER PROCESSED BY BLOOD BANK   Final  . Sodium 04/24/2014 134* 137 - 147 mEq/L Final  . Potassium 04/24/2014 4.1  3.7 - 5.3 mEq/L Final   Comment: MODERATE HEMOLYSIS                          HEMOLYSIS AT THIS LEVEL MAY AFFECT RESULT  . Chloride 04/24/2014 98  96 - 112 mEq/L Final  . CO2 04/24/2014 26  19 - 32 mEq/L Final  . Glucose, Bld 04/24/2014 108* 70 - 99 mg/dL Final  . BUN 04/24/2014 12  6 - 23 mg/dL Final  . Creatinine, Ser 04/24/2014 0.66  0.50 - 1.10 mg/dL Final  .  Calcium 04/24/2014 9.1  8.4 - 10.5 mg/dL Final  . GFR calc non Af Amer 04/24/2014 82* >90 mL/min Final  . GFR calc Af Amer 04/24/2014 >90  >90 mL/min Final   Comment: (NOTE)                          The eGFR has been calculated using the CKD EPI equation.                          This calculation has not been validated in all clinical situations.                          eGFR's persistently  <90 mL/min signify possible Chronic Kidney                          Disease.  . WBC 04/24/2014 10.6* 4.0 - 10.5 K/uL Final  . RBC 04/24/2014 3.38* 3.87 - 5.11 MIL/uL Final  . Hemoglobin 04/24/2014 9.9* 12.0 - 15.0 g/dL Final   Comment: DELTA CHECK NOTED                          REPEATED TO VERIFY                          POST TRANSFUSION SPECIMEN  . HCT 04/24/2014 28.5* 36.0 - 46.0 % Final  . MCV 04/24/2014 84.3  78.0 - 100.0 fL Final  . MCH 04/24/2014 29.9  26.0 - 34.0 pg Final  . MCHC 04/24/2014 35.4  30.0 - 36.0 g/dL Final  . RDW 04/24/2014 15.5  11.5 - 15.5 % Final  . Platelets 04/24/2014 165  150 - 400 K/uL Final     X-Rays:Dg Pelvis Portable  04/21/2014   CLINICAL DATA:  Postop right total hip replacement  EXAM: PORTABLE PELVIS 1-2 VIEWS  COMPARISON:  Intraoperative fluoroscopic radiographs dated 11/22/2013  FINDINGS: Interval placement of a right total hip arthroplasty in satisfactory position. Associated subcutaneous gas.  Removal of a prior IM rod with dynamic hip screw. Fracture fragments remain mildly displaced/angulated.  Left total hip arthroplasty without evidence of complication.  Visualized bony pelvis appears intact.  IMPRESSION: Right total hip arthroplasty in satisfactory position.  Prior left total hip arthroplasty.   Electronically Signed   By: Julian Hy M.D.   On: 04/21/2014 15:21   Dg Hip Portable 1 View Right  04/21/2014   CLINICAL DATA:  postop  EXAM: PORTABLE RIGHT HIP - 1 VIEW  COMPARISON:  DG HIP OPERATIVE*R* dated 11/22/2013  FINDINGS: Patient is status post total right hip arthroplasty. Hardware appears intact. No evidence of loosening or failure. Postsurgical changes are appreciated within the surrounding soft tissues. The native osseous structures demonstrate no gross acute abnormalities. A remote fracture is again appreciated within the proximal femoral shaft.  IMPRESSION: Patient  status post total right hip arthroplasty.   Electronically Signed   By:  Margaree Mackintosh M.D.   On: 04/21/2014 15:29    EKG: Orders placed during the hospital encounter of 11/21/13  . EKG 12-LEAD  . EKG 12-LEAD  . EKG 12-LEAD  . EKG     Hospital Course: Alexandra Snyder is a 80 y.o. who was admitted to [facility]. They were brought to the operating room on 04/21/2014 and underwent  Procedure(s): CONVERSION COMPRESSION HIP SCREW TO TOTAL RIGHT HIP ARTHROPLASTY.  Patient tolerated the procedure well and was later transferred to the recovery room and then to the orthopaedic floor for postoperative care.  They were given PO and IV analgesics for pain control following their surgery.  They were given 24 hours of postoperative antibiotics of  Anti-infectives   Start     Dose/Rate Route Frequency Ordered Stop   04/21/14 1730  ceFAZolin (ANCEF) IVPB 1 g/50 mL premix     1 g 100 mL/hr over 30 Minutes Intravenous Every 6 hours 04/21/14 1636 04/21/14 2349   04/21/14 1115  vancomycin (VANCOCIN) IVPB 1000 mg/200 mL premix     1,000 mg 200 mL/hr over 60 Minutes Intravenous  Once 04/21/14 1112 04/21/14 1307   04/21/14 0741  ceFAZolin (ANCEF) IVPB 2 g/50 mL premix     2 g 100 mL/hr over 30 Minutes Intravenous On call to O.R. 04/21/14 0867 04/21/14 1130     and started on DVT prophylaxis in the form of Xarelto.   PT and OT were ordered for total joint protocol.  Discharge planning consulted to help with postop disposition and equipment needs.  Patient had a decent night on the evening of surgery.  They started to get up OOB with therapy on day one, PWB 50% right LE. Patient developed ABLA with Hgb 7.5. Continued to work with therapy into day two.  She became symptomatic with ABLA requiring her to receive 2 units of blood.  By day three, the patient had progressed with therapy and meeting their goals.  Incision was healing well.  Patient was seen in rounds and was ready to go home.   Discharge Medications: Prior to Admission medications   Medication Sig Start Date End  Date Taking? Authorizing Provider  acetaminophen (TYLENOL) 500 MG tablet Take 500 mg by mouth every 6 (six) hours as needed for mild pain.   Yes Historical Provider, MD  albuterol (PROVENTIL HFA;VENTOLIN HFA) 108 (90 BASE) MCG/ACT inhaler Inhale 2 puffs into the lungs every 4 (four) hours as needed for wheezing.    Yes Historical Provider, MD  anastrozole (ARIMIDEX) 1 MG tablet Take 1 mg by mouth daily. 08/27/13  Yes Deatra Robinson, MD  cholecalciferol (VITAMIN D) 1000 UNITS tablet Take 1,000 Units by mouth 2 (two) times daily.   Yes Historical Provider, MD  traMADol (ULTRAM) 50 MG tablet Take 50 mg by mouth every 6 (six) hours as needed for moderate pain.   Yes Historical Provider, MD  alum & mag hydroxide-simeth (MAALOX/MYLANTA) 200-200-20 MG/5ML suspension Take 30 mLs by mouth every 4 (four) hours as needed for indigestion. 04/24/14   Mauri Pole, MD  diphenhydrAMINE (BENADRYL) 12.5 MG/5ML elixir Take 10 mLs (25 mg total) by mouth every 6 (six) hours as needed for itching. 04/24/14   Mauri Pole, MD  docusate sodium 100 MG CAPS Take 100 mg by mouth 2 (two) times daily. 04/24/14   Mauri Pole, MD  ferrous sulfate 325 (65 FE) MG tablet Take 1 tablet (325 mg total) by mouth 3 (three) times daily after meals. 04/24/14   Mauri Pole, MD  guaiFENesin (MUCINEX) 600 MG 12 hr tablet Take 1 tablet (600 mg total) by mouth 2 (two) times daily as needed for to loosen phlegm. 04/24/14   Mauri Pole, MD  HYDROcodone-acetaminophen ([place]) 7.5-325 MG per tablet Take 1-2 tablets by mouth every 4 (four) hours. 04/24/14   Mauri Pole, MD  menthol-cetylpyridinium (CEPACOL) 3  MG lozenge Take 1 lozenge (3 mg total) by mouth as needed for sore throat (sore throat). 04/24/14   Mauri Pole, MD  methocarbamol (ROBAXIN) 500 MG tablet Take 1 tablet (500 mg total) by mouth every 6 (six) hours as needed for muscle spasms. 04/24/14   Mauri Pole, MD  mupirocin ointment (BACTROBAN) 2 % Place into the nose 2 (two) times daily.  04/24/14 04/27/14  Mauri Pole, MD  polyethylene glycol [facility] / Floria Raveling) packet Take 17 g by mouth daily as needed for mild constipation. 04/24/14   Mauri Pole, MD  rivaroxaban (XARELTO) 10 MG TABS tablet Take 1 tablet (10 mg total) by mouth daily with breakfast. 04/24/14   Mauri Pole, MD  senna (SENOKOT) 8.6 MG TABS tablet Take 1 tablet (8.6 mg total) by mouth 2 (two) times daily. 04/24/14   Mauri Pole, MD    Diet: Regular diet Activity:PWB 50% right LE Follow-up:in 2 weeks Disposition - Skilled nursing facility Discharged Condition: stable   Discharge Orders   Future Appointments Provider Department Dept Phone   05/15/2014 10:30 AM [facility] [phone]   05/15/2014 11:00 AM Deatra Robinson, MD [facility] [phone]   Future Orders Complete By Expires   Call MD / Call 911  As directed    Constipation Prevention  As directed    Discharge instructions  As directed    Driving restrictions  As directed    Follow the hip precautions as taught in Physical Therapy  As directed    Increase activity slowly as tolerated  As directed        Medication List         acetaminophen 500 MG tablet  Commonly known as:  TYLENOL  Take 500 mg by mouth every 6 (six) hours as needed for mild pain.     albuterol 108 (90 BASE) MCG/ACT inhaler  Commonly known as:  PROVENTIL HFA;VENTOLIN HFA  Inhale 2 puffs into the lungs every 4 (four) hours as needed for wheezing.     alum & mag hydroxide-simeth 200-200-20 MG/5ML suspension  Commonly known as:  MAALOX/MYLANTA  Take 30 mLs by mouth every 4 (four) hours as needed for indigestion.     anastrozole 1 MG tablet  Commonly known as:  ARIMIDEX  Take 1 mg by mouth daily.     cholecalciferol 1000 UNITS tablet  Commonly known as:  VITAMIN D  Take 1,000 Units by mouth 2 (two) times daily.     diphenhydrAMINE 12.5 MG/5ML elixir  Commonly known as:  BENADRYL    Take 10 mLs (25 mg total) by mouth every 6 (six) hours as needed for itching.     DSS 100 MG Caps  Take 100 mg by mouth 2 (two) times daily.     ferrous sulfate 325 (65 FE) MG tablet  Take 1 tablet (325 mg total) by mouth 3 (three) times daily after meals.     guaiFENesin 600 MG 12 hr tablet  Commonly known as:  MUCINEX  Take 1 tablet (600 mg total) by mouth 2 (two) times daily as needed for to loosen phlegm.     HYDROcodone-acetaminophen 7.5-325 MG per tablet  Commonly known as:  NORCO  Take 1-2 tablets by mouth every 4 (four) hours.     menthol-cetylpyridinium 3 MG lozenge  Commonly known as:  CEPACOL  Take 1 lozenge (3 mg total) by mouth as needed for sore throat (  sore throat).     methocarbamol 500 MG tablet  Commonly known as:  ROBAXIN  Take 1 tablet (500 mg total) by mouth every 6 (six) hours as needed for muscle spasms.     mupirocin ointment 2 %  Commonly known as:  BACTROBAN  Place into the nose 2 (two) times daily.     polyethylene glycol packet  Commonly known as:  MIRALAX / GLYCOLAX  Take 17 g by mouth daily as needed for mild constipation.     rivaroxaban 10 MG Tabs tablet  Commonly known as:  XARELTO  Take 1 tablet (10 mg total) by mouth daily with breakfast.     senna 8.6 MG Tabs tablet  Commonly known as:  SENOKOT  Take 1 tablet (8.6 mg total) by mouth 2 (two) times daily.     traMADol 50 MG tablet  Commonly known as:  ULTRAM  Take 50 mg by mouth every 6 (six) hours as needed for moderate pain.           Follow-up Information   Follow up with Mauri Pole, MD. Schedule an appointment as soon as possible for a visit in 2 weeks.   Specialty:  Orthopedic Surgery   Contact information:   [address] [place] [address] [phone]       Signed: Ardeen Jourdain, PA-C Orthopaedic Surgery 04/24/2014, 1:11 PM

## 2014-04-24 NOTE — Progress Notes (Signed)
Physical Therapy Treatment Patient Details Name: Alexandra Snyder MRN: [id number] DOB: 11/16/1934 Today's Date: 04/24/2014    History of Present Illness CONVERSION COMPRESSION HIP SCREW TO TOTAL RIGHT HIP ARTHROPLASTY     PT Comments    Marked improvement in activity tolerance following transfusion  Follow Up Recommendations  SNF     Equipment Recommendations  None recommended by PT    Recommendations for Other Services OT consult     Precautions / Restrictions Precautions Precautions: Fall;Posterior Hip Precaution Booklet Issued: Yes (comment) Precaution Comments: Sign hung in room and all post THP reviewed Restrictions Weight Bearing Restrictions: Yes RLE Weight Bearing: Touchdown weight bearing Other Position/Activity Restrictions: TDWB    Mobility  Bed Mobility Overal bed mobility: Needs Assistance Bed Mobility: Supine to Sit     Supine to sit: Min assist     General bed mobility comments: cues for sequence and use of L LE to self assist; increased time  Transfers Overall transfer level: Needs assistance Equipment used: Rolling walker (2 wheeled) Transfers: Sit to/from Stand Sit to Stand: Min assist         General transfer comment: cues for LE management and use of UEs to self assist  Ambulation/Gait Ambulation/Gait assistance: Min assist Ambulation Distance (Feet): 57 Feet Assistive device: Rolling walker (2 wheeled) Gait Pattern/deviations: Step-to pattern;Decreased step length - right;Decreased step length - left;Shuffle;Trunk flexed Gait velocity: decr   General Gait Details: cues for sequence, posture, stride length, position from RW and TDWB on R; Pt ltd this pm by fatigue   Stairs            Wheelchair Mobility    Modified Rankin (Stroke Patients Only)       Balance                                    Cognition Arousal/Alertness: Awake/alert Behavior During Therapy: WFL for tasks assessed/performed Overall  Cognitive Status: Within Functional Limits for tasks assessed                      Exercises Total Joint Exercises Ankle Circles/Pumps: AROM;Both;15 reps;Supine Quad Sets: AROM;Both;Supine;15 reps Gluteal Sets: AROM;Both;10 reps;Supine Heel Slides: AAROM;Right;Supine;20 reps Hip ABduction/ADduction: AAROM;Right;Supine;20 reps    General Comments        Pertinent Vitals/Pain 4/10; premed    Home Living                      Prior Function            PT Goals (current goals can now be found in the care plan section) Acute Rehab PT Goals Patient Stated Goal: go to rehab then home! PT Goal Formulation: With patient Time For Goal Achievement: 04/29/14 Potential to Achieve Goals: Good Progress towards PT goals: Progressing toward goals    Frequency  7X/week    PT Plan Current plan remains appropriate    Co-evaluation             End of Session Equipment Utilized During Treatment: Gait belt Activity Tolerance: Patient tolerated treatment well Patient left: in chair;with call bell/phone within reach     Time: 0818-0856 PT Time Calculation (min): 38 min  Charges:  $Gait Training: 23-37 mins $Therapeutic Exercise: 8-22 mins                    G Codes:  Mathis Fare 04/24/2014, 9:11 AM

## 2014-04-24 NOTE — Progress Notes (Signed)
RN called report to [place] at [facility]. All questions answered.   Patient transported by [facility] to SNF.

## 2014-04-24 NOTE — Progress Notes (Signed)
Clinical Social Work Department CLINICAL SOCIAL WORK PLACEMENT NOTE 04/24/2014  Patient:  Alexandra Snyder, Alexandra Snyder  Account Number:  [id number] Admit date:  04/21/2014  Clinical Social Worker:  Werner Lean, LCSW  Date/time:  04/22/2014 01:39 PM  Clinical Social Work is seeking post-discharge placement for this patient at the following level of care:   SKILLED NURSING   (*CSW will update this form in Epic as items are completed)     Patient/family provided with [facility] Department of Clinical Social Work's list of facilities offering this level of care within the geographic area requested by the patient (or if unable, by the patient's family).  04/22/2014  Patient/family informed of their freedom to choose among providers that offer the needed level of care, that participate in Medicare, Medicaid or managed care program needed by the patient, have an available bed and are willing to accept the patient.    Patient/family informed of [facility]' ownership interest in [facility], as well as of the fact that they are under no obligation to receive care at this facility.  PASARR submitted to EDS on 04/22/2014 PASARR number received from EDS on 04/22/2014  FL2 transmitted to all facilities in geographic area requested by pt/family on  04/22/2014 FL2 transmitted to all facilities within larger geographic area on   Patient informed that his/her managed care company has contracts with or will negotiate with  certain facilities, including the following:     Patient/family informed of bed offers received:  04/22/2014 Patient chooses bed at [facility] Physician recommends and patient chooses bed at    Patient to be transferred to [facility] on  04/24/2014 Patient to be transferred to facility by [facility]  The following physician request were entered in Epic:   Additional Comments:   Werner Lean LCSW [phone]

## 2014-04-24 NOTE — Plan of Care (Signed)
Problem: Consults Goal: Diagnosis- Total Joint Replacement Outcome: Completed/Met Date Met:  04/24/14 Primary Total Hip RIGHT  Problem: Phase II Progression Outcomes Goal: Discharge plan established Outcome: Completed/Met Date Met:  04/24/14 Alexandra Snyder  Problem: Phase III Progression Outcomes Goal: Anticoagulant follow-up in place Outcome: Not Applicable Date Met:  63/89/37 Xarelto for VTE, no f/u needed.

## 2014-04-30 ENCOUNTER — Telehealth: Payer: Self-pay | Admitting: Oncology

## 2014-04-30 NOTE — Telephone Encounter (Signed)
, °

## 2014-05-12 IMAGING — CR DG PORTABLE PELVIS
1 series · 1 of 1 positions shown · non-contrast
Comparison: Intraoperative fluoroscopic radiographs dated
11/22/2013

CLINICAL DATA: Postop right total hip replacement

EXAM:
PORTABLE PELVIS 1-2 VIEWS

[AP]
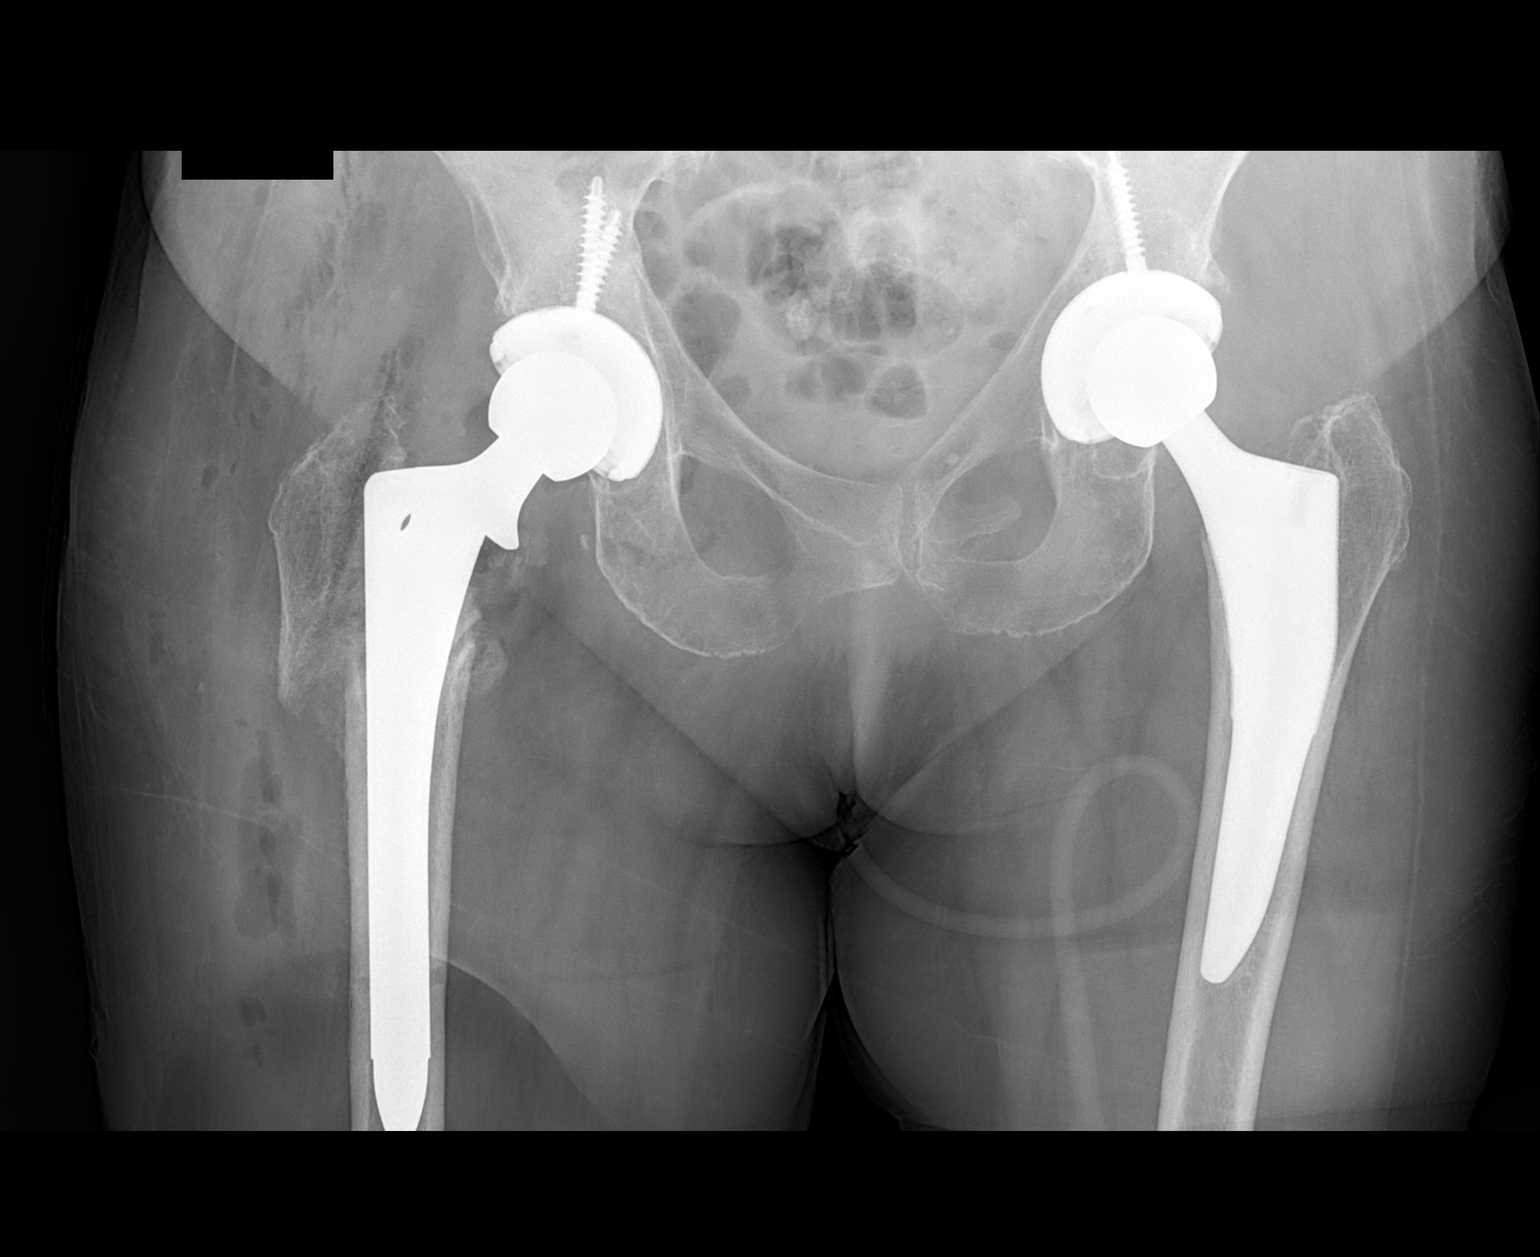

[1 of 1 positions shown; findings below may reference images not displayed]

FINDINGS: Interval placement of a right total hip arthroplasty in satisfactory
position. Associated subcutaneous gas.

Removal of a prior IM rod with dynamic hip screw. Fracture fragments
remain mildly displaced/angulated.

Left total hip arthroplasty without evidence of complication.

Visualized bony pelvis appears intact.
IMPRESSION: Right total hip arthroplasty in satisfactory position.

Prior left total hip arthroplasty.

## 2014-05-15 ENCOUNTER — Other Ambulatory Visit: Payer: Medicare Other

## 2014-05-15 ENCOUNTER — Ambulatory Visit: Payer: Medicare Other | Admitting: Oncology

## 2014-05-15 ENCOUNTER — Encounter: Payer: Self-pay | Admitting: *Deleted

## 2014-05-15 NOTE — [facility] Oncology Navigator Note (Signed)
I called patient to check in and to f/u on her cancelled appointment this AM.  I left a voice mail message with my contact information and request that she return my call.

## 2014-06-18 ENCOUNTER — Encounter (INDEPENDENT_AMBULATORY_CARE_PROVIDER_SITE_OTHER): Payer: Self-pay | Admitting: General Surgery

## 2014-07-21 ENCOUNTER — Encounter: Payer: Self-pay | Admitting: *Deleted

## 2014-07-21 NOTE — [facility] Oncology Navigator Note (Signed)
I called patient to check in and follow up on an appointment with medical oncology.  Patient reports that she has just completed physical therapy following her second hip surgery.  She had cancelled her appointment because she has had two hip surgeries and surgery for a broken leg since 11/14.  She reports that she continues to take anastrazole and is tolerating it without hot flashes or other symptoms.  She has had some breathing difficulties related to her COPD and the hot weather that she says is relieved with her inhaler.  I encouraged her to call me for any needs and to also call scheduling to schedule an appointment with medical oncology and gave her the number for scheduling.  She denied any other questions or concerns at this time.

## 2014-08-28 ENCOUNTER — Ambulatory Visit (INDEPENDENT_AMBULATORY_CARE_PROVIDER_SITE_OTHER): Payer: Medicare Other | Admitting: General Surgery

## 2014-09-07 ENCOUNTER — Other Ambulatory Visit: Payer: Self-pay | Admitting: Dermatology

## 2014-10-24 ENCOUNTER — Other Ambulatory Visit: Payer: Self-pay | Admitting: Oncology

## 2014-10-24 DIAGNOSIS — C50419 Malignant neoplasm of upper-outer quadrant of unspecified female breast: Secondary | ICD-10-CM

## 2014-10-26 ENCOUNTER — Encounter (HOSPITAL_COMMUNITY): Payer: Self-pay | Admitting: Orthopedic Surgery

## 2014-11-16 ENCOUNTER — Encounter: Payer: Self-pay | Admitting: *Deleted

## 2014-11-16 ENCOUNTER — Other Ambulatory Visit: Payer: Self-pay | Admitting: *Deleted

## 2014-11-16 DIAGNOSIS — C50419 Malignant neoplasm of upper-outer quadrant of unspecified female breast: Secondary | ICD-10-CM

## 2014-11-16 NOTE — [facility] Oncology Navigator Note (Signed)
I called patient to check in.  Patient reports that she is doing OK.  She is experiencing some shortness of breath related to her COPD.  She reports that she saw Dr. Donne Hazel in September and her check up was good.  We discussed that she missed her appointment with medical oncology due to her rehab after her hip surgeries.  She reported and his progress notes indicate that Dr. Donne Hazel instructed her to follow up with medical oncology in 6 months and with him again in 1 year.  I sent a request to schedule patient to see Dr. Burr Medico in March, 2016.  I encouraged patient to call me if she has any questions or needs.

## 2014-11-18 ENCOUNTER — Telehealth: Payer: Self-pay | Admitting: Hematology

## 2014-11-18 NOTE — Telephone Encounter (Signed)
, °

## 2015-01-01 ENCOUNTER — Other Ambulatory Visit: Payer: Self-pay | Admitting: *Deleted

## 2015-01-01 DIAGNOSIS — C50419 Malignant neoplasm of upper-outer quadrant of unspecified female breast: Secondary | ICD-10-CM

## 2015-01-01 MED ORDER — ANASTROZOLE 1 MG PO TABS: 1.0000 mg | ORAL_TABLET | Freq: Every day | ORAL | Status: DC

## 2015-02-15 ENCOUNTER — Other Ambulatory Visit: Payer: Self-pay | Admitting: Family

## 2015-02-15 DIAGNOSIS — R946 Abnormal results of thyroid function studies: Secondary | ICD-10-CM

## 2015-02-22 ENCOUNTER — Other Ambulatory Visit: Payer: Self-pay

## 2015-03-01 ENCOUNTER — Telehealth: Payer: Self-pay | Admitting: *Deleted

## 2015-03-01 NOTE — Telephone Encounter (Signed)
I called patient to check in.  Left voice mail message with contact information.

## 2015-03-02 ENCOUNTER — Telehealth: Payer: Self-pay | Admitting: *Deleted

## 2015-03-02 NOTE — Telephone Encounter (Signed)
Patient returned my call.  She reports that she recently had to have her back injected with cortisone and sprained her back when getting on the exam table. She has experienced a lot of discomfort and recently had "lidocaine" injected in her back muscles which has resulted in a little improvement.  She reports that she is also having problems with her rotator cuff in her shoulder.  She continues to take Arimidex and is tolerating it well and denies any side effects. We reviewed her upcoming appointments and I reminded her that she is to see Dr. Burr Medico on 03/18/15.  I encouraged her to call me if she has any needs.

## 2015-03-05 ENCOUNTER — Other Ambulatory Visit: Payer: Self-pay

## 2015-03-12 ENCOUNTER — Emergency Department (HOSPITAL_COMMUNITY): Payer: Medicare Other

## 2015-03-12 ENCOUNTER — Encounter (HOSPITAL_COMMUNITY): Payer: Self-pay | Admitting: Emergency Medicine

## 2015-03-12 ENCOUNTER — Other Ambulatory Visit: Payer: Self-pay

## 2015-03-12 ENCOUNTER — Inpatient Hospital Stay (HOSPITAL_COMMUNITY): Payer: Medicare Other

## 2015-03-12 ENCOUNTER — Inpatient Hospital Stay (HOSPITAL_COMMUNITY)
Admission: EM | Admit: 2015-03-12 | Discharge: 2015-03-16 | DRG: 543 | Disposition: A | Discharge status: Skilled Nursing Facility | Payer: Medicare Other | Attending: Internal Medicine | Admitting: Internal Medicine

## 2015-03-12 DIAGNOSIS — M1990 Unspecified osteoarthritis, unspecified site: Secondary | ICD-10-CM | POA: Diagnosis present

## 2015-03-12 DIAGNOSIS — L299 Pruritus, unspecified: Secondary | ICD-10-CM

## 2015-03-12 DIAGNOSIS — E780 Pure hypercholesterolemia: Secondary | ICD-10-CM | POA: Diagnosis present

## 2015-03-12 DIAGNOSIS — M8448XA Pathological fracture, other site, initial encounter for fracture: Secondary | ICD-10-CM | POA: Diagnosis present

## 2015-03-12 DIAGNOSIS — Z96642 Presence of left artificial hip joint: Secondary | ICD-10-CM | POA: Diagnosis present

## 2015-03-12 DIAGNOSIS — Z79899 Other long term (current) drug therapy: Secondary | ICD-10-CM

## 2015-03-12 DIAGNOSIS — Z8249 Family history of ischemic heart disease and other diseases of the circulatory system: Secondary | ICD-10-CM

## 2015-03-12 DIAGNOSIS — J438 Other emphysema: Secondary | ICD-10-CM

## 2015-03-12 DIAGNOSIS — M549 Dorsalgia, unspecified: Secondary | ICD-10-CM | POA: Diagnosis not present

## 2015-03-12 DIAGNOSIS — C7951 Secondary malignant neoplasm of bone: Secondary | ICD-10-CM | POA: Diagnosis not present

## 2015-03-12 DIAGNOSIS — E876 Hypokalemia: Secondary | ICD-10-CM | POA: Diagnosis present

## 2015-03-12 DIAGNOSIS — G893 Neoplasm related pain (acute) (chronic): Secondary | ICD-10-CM | POA: Insufficient documentation

## 2015-03-12 DIAGNOSIS — R29898 Other symptoms and signs involving the musculoskeletal system: Secondary | ICD-10-CM | POA: Diagnosis present

## 2015-03-12 DIAGNOSIS — Z888 Allergy status to other drugs, medicaments and biological substances status: Secondary | ICD-10-CM

## 2015-03-12 DIAGNOSIS — C7931 Secondary malignant neoplasm of brain: Secondary | ICD-10-CM | POA: Diagnosis present

## 2015-03-12 DIAGNOSIS — C799 Secondary malignant neoplasm of unspecified site: Secondary | ICD-10-CM

## 2015-03-12 DIAGNOSIS — Z923 Personal history of irradiation: Secondary | ICD-10-CM | POA: Diagnosis not present

## 2015-03-12 DIAGNOSIS — J449 Chronic obstructive pulmonary disease, unspecified: Secondary | ICD-10-CM | POA: Diagnosis present

## 2015-03-12 DIAGNOSIS — Z85828 Personal history of other malignant neoplasm of skin: Secondary | ICD-10-CM | POA: Diagnosis not present

## 2015-03-12 DIAGNOSIS — Z8041 Family history of malignant neoplasm of ovary: Secondary | ICD-10-CM

## 2015-03-12 DIAGNOSIS — C3490 Malignant neoplasm of unspecified part of unspecified bronchus or lung: Secondary | ICD-10-CM | POA: Diagnosis present

## 2015-03-12 DIAGNOSIS — Z79891 Long term (current) use of opiate analgesic: Secondary | ICD-10-CM | POA: Diagnosis not present

## 2015-03-12 DIAGNOSIS — D509 Iron deficiency anemia, unspecified: Secondary | ICD-10-CM | POA: Diagnosis present

## 2015-03-12 DIAGNOSIS — Z66 Do not resuscitate: Secondary | ICD-10-CM | POA: Diagnosis present

## 2015-03-12 DIAGNOSIS — Z96641 Presence of right artificial hip joint: Secondary | ICD-10-CM | POA: Diagnosis present

## 2015-03-12 DIAGNOSIS — C787 Secondary malignant neoplasm of liver and intrahepatic bile duct: Secondary | ICD-10-CM | POA: Diagnosis present

## 2015-03-12 DIAGNOSIS — Z7901 Long term (current) use of anticoagulants: Secondary | ICD-10-CM

## 2015-03-12 DIAGNOSIS — C50919 Malignant neoplasm of unspecified site of unspecified female breast: Secondary | ICD-10-CM | POA: Diagnosis present

## 2015-03-12 DIAGNOSIS — C7970 Secondary malignant neoplasm of unspecified adrenal gland: Secondary | ICD-10-CM | POA: Diagnosis present

## 2015-03-12 DIAGNOSIS — Z791 Long term (current) use of non-steroidal anti-inflammatories (NSAID): Secondary | ICD-10-CM | POA: Diagnosis not present

## 2015-03-12 DIAGNOSIS — M545 Low back pain, unspecified: Secondary | ICD-10-CM

## 2015-03-12 DIAGNOSIS — Z515 Encounter for palliative care: Secondary | ICD-10-CM | POA: Diagnosis not present

## 2015-03-12 DIAGNOSIS — C7889 Secondary malignant neoplasm of other digestive organs: Secondary | ICD-10-CM | POA: Diagnosis present

## 2015-03-12 DIAGNOSIS — J9819 Other pulmonary collapse: Secondary | ICD-10-CM | POA: Diagnosis present

## 2015-03-12 DIAGNOSIS — J45909 Unspecified asthma, uncomplicated: Secondary | ICD-10-CM | POA: Diagnosis present

## 2015-03-12 DIAGNOSIS — J439 Emphysema, unspecified: Secondary | ICD-10-CM

## 2015-03-12 DIAGNOSIS — Z87891 Personal history of nicotine dependence: Secondary | ICD-10-CM

## 2015-03-12 DIAGNOSIS — Z885 Allergy status to narcotic agent status: Secondary | ICD-10-CM | POA: Diagnosis not present

## 2015-03-12 HISTORY — DX: Other intervertebral disc degeneration, lumbar region: M51.36

## 2015-03-12 HISTORY — DX: Unspecified osteoarthritis, unspecified site: M19.90

## 2015-03-12 HISTORY — DX: Other intervertebral disc degeneration, lumbar region without mention of lumbar back pain or lower extremity pain: M51.369

## 2015-03-12 LAB — I-STAT CHEM 8, ED
BUN: 21 mg/dL (ref 6–23)
CALCIUM ION: 1.14 mmol/L (ref 1.13–1.30)
CHLORIDE: 103 mmol/L (ref 96–112)
Creatinine, Ser: 0.8 mg/dL (ref 0.50–1.10)
Glucose, Bld: 98 mg/dL (ref 70–99)
HEMATOCRIT: 38 % (ref 36.0–46.0)
Hemoglobin: 12.9 g/dL (ref 12.0–15.0)
POTASSIUM: 2.9 mmol/L — AB (ref 3.5–5.1)
SODIUM: 141 mmol/L (ref 135–145)
TCO2: 23 mmol/L (ref 0–100)

## 2015-03-12 LAB — CBC WITH DIFFERENTIAL/PLATELET
BASOS PCT: 0 % (ref 0–1)
Basophils Absolute: 0 10*3/uL (ref 0.0–0.1)
EOS ABS: 0 10*3/uL (ref 0.0–0.7)
Eosinophils Relative: 0 % (ref 0–5)
HCT: 34.4 % — ABNORMAL LOW (ref 36.0–46.0)
Hemoglobin: 11.3 g/dL — ABNORMAL LOW (ref 12.0–15.0)
Lymphocytes Relative: 16 % (ref 12–46)
Lymphs Abs: 1.8 10*3/uL (ref 0.7–4.0)
MCH: 28.4 pg (ref 26.0–34.0)
MCHC: 32.8 g/dL (ref 30.0–36.0)
MCV: 86.4 fL (ref 78.0–100.0)
Monocytes Absolute: 1.4 10*3/uL — ABNORMAL HIGH (ref 0.1–1.0)
Monocytes Relative: 12 % (ref 3–12)
Neutro Abs: 7.9 10*3/uL — ABNORMAL HIGH (ref 1.7–7.7)
Neutrophils Relative %: 72 % (ref 43–77)
PLATELETS: 171 10*3/uL (ref 150–400)
RBC: 3.98 MIL/uL (ref 3.87–5.11)
RDW: 15.9 % — AB (ref 11.5–15.5)
WBC: 11.1 10*3/uL — ABNORMAL HIGH (ref 4.0–10.5)

## 2015-03-12 LAB — PROTIME-INR
INR: 1.07 (ref 0.00–1.49)
Prothrombin Time: 14 seconds (ref 11.6–15.2)

## 2015-03-12 LAB — I-STAT CREATININE, ED: CREATININE: 0.8 mg/dL (ref 0.50–1.10)

## 2015-03-12 MED ORDER — GADOBENATE DIMEGLUMINE 529 MG/ML IV SOLN
10.0000 mL | Freq: Once | INTRAVENOUS | Status: AC | PRN
  Administered 2015-03-12: 10 mL via INTRAVENOUS

## 2015-03-12 MED ORDER — DIPHENHYDRAMINE HCL 25 MG PO CAPS
25.0000 mg | ORAL_CAPSULE | Freq: Four times a day (QID) | ORAL | Status: DC | PRN
  Administered 2015-03-13 – 2015-03-15 (×2): 25 mg via ORAL
  Filled 2015-03-12 (×2): qty 1

## 2015-03-12 MED ORDER — ACETAMINOPHEN 500 MG PO TABS
1000.0000 mg | ORAL_TABLET | Freq: Once | ORAL | Status: AC
  Administered 2015-03-12: 1000 mg via ORAL
  Filled 2015-03-12: qty 2

## 2015-03-12 MED ORDER — METHOCARBAMOL 500 MG PO TABS
500.0000 mg | ORAL_TABLET | Freq: Four times a day (QID) | ORAL | Status: DC | PRN
  Administered 2015-03-14: 500 mg via ORAL
  Filled 2015-03-12: qty 1

## 2015-03-12 MED ORDER — ANASTROZOLE 1 MG PO TABS
1.0000 mg | ORAL_TABLET | Freq: Every day | ORAL | Status: DC
  Administered 2015-03-13 – 2015-03-15 (×3): 1 mg via ORAL
  Filled 2015-03-12 (×5): qty 1

## 2015-03-12 MED ORDER — LORAZEPAM 1 MG PO TABS
1.0000 mg | ORAL_TABLET | Freq: Once | ORAL | Status: AC
  Administered 2015-03-12: 1 mg via ORAL
  Filled 2015-03-12: qty 1

## 2015-03-12 MED ORDER — DEXAMETHASONE SODIUM PHOSPHATE 4 MG/ML IJ SOLN
4.0000 mg | Freq: Two times a day (BID) | INTRAMUSCULAR | Status: DC
  Administered 2015-03-12 – 2015-03-16 (×8): 4 mg via INTRAVENOUS
  Filled 2015-03-12 (×9): qty 1

## 2015-03-12 MED ORDER — MORPHINE SULFATE 2 MG/ML IJ SOLN: 2.0000 mg | INTRAMUSCULAR | Status: DC | PRN

## 2015-03-12 MED ORDER — POTASSIUM CHLORIDE CRYS ER 20 MEQ PO TBCR
40.0000 meq | EXTENDED_RELEASE_TABLET | Freq: Once | ORAL | Status: AC
  Administered 2015-03-12: 40 meq via ORAL
  Filled 2015-03-12: qty 2

## 2015-03-12 MED ORDER — ACETAMINOPHEN 325 MG PO TABS
650.0000 mg | ORAL_TABLET | Freq: Four times a day (QID) | ORAL | Status: DC | PRN
  Administered 2015-03-13 – 2015-03-14 (×3): 650 mg via ORAL
  Filled 2015-03-12 (×3): qty 2

## 2015-03-12 MED ORDER — ACETAMINOPHEN 650 MG RE SUPP: 650.0000 mg | Freq: Four times a day (QID) | RECTAL | Status: DC | PRN

## 2015-03-12 MED ORDER — ONDANSETRON HCL 4 MG/2ML IJ SOLN: 4.0000 mg | Freq: Four times a day (QID) | INTRAMUSCULAR | Status: DC | PRN

## 2015-03-12 MED ORDER — TRAMADOL HCL 50 MG PO TABS
50.0000 mg | ORAL_TABLET | Freq: Once | ORAL | Status: AC
  Administered 2015-03-12: 50 mg via ORAL
  Filled 2015-03-12: qty 1

## 2015-03-12 MED ORDER — VITAMINS A & D EX OINT
TOPICAL_OINTMENT | CUTANEOUS | Status: AC
  Administered 2015-03-12: 1
  Filled 2015-03-12: qty 5

## 2015-03-12 MED ORDER — ONDANSETRON HCL 4 MG PO TABS: 4.0000 mg | ORAL_TABLET | Freq: Four times a day (QID) | ORAL | Status: DC | PRN

## 2015-03-12 MED ORDER — HYDROCODONE-ACETAMINOPHEN 5-325 MG PO TABS
0.5000 | ORAL_TABLET | ORAL | Status: DC | PRN
  Administered 2015-03-13 – 2015-03-14 (×2): 1 via ORAL
  Filled 2015-03-12 (×3): qty 1

## 2015-03-12 NOTE — Consult Note (Signed)
Radiation Oncology         [phone] ________________________________  Name: Alexandra Snyder MRN: [id number]  Date: 03/12/2015  DOB: 1934/10/21  CC:No primary care provider on file.  No ref. provider found     REFERRING PHYSICIAN: No ref. provider found   DIAGNOSIS: Diagnoses of Lower back pain and Metastatic cancer were pertinent to this visit.   HISTORY OF PRESENT ILLNESS::Alexandra Snyder is a 81 y.o. female who is seen for an initial consultation visit regarding the patient's diagnosis of metastatic cancer.  The patient has a history of left-sided breast cancer and she completed treatment with adjuvant radiation in June 2014. The patient has been on anti-hormonal treatment and she states that she has done well in regards to this diagnosis. More recently however, she has been experiencing some significant, progressive back pain. She has received some injections for this but the pain has been persistent. She began experiencing some increased difficulty with ambulation as well, largely felt to be due to pain according to the patient. She therefore presented to the emergency department.  A MRI scan of the lumbar spine has been completed. Diffuse metastatic tumor was seen throughout the entire visualized portion of the spine as well as the bony pelvis. Pathologic fracture of T10 is present in addition to significant posterior involvement of L3 with some compression of the thecal sac in this area. Intradural metastases are also seen within the thecal sac at L1. Additional findings include apparent extensive metastatic disease in the liver as well as probable metastatic disease to the adrenal glands.  The patient denies any headaches or nausea. She denies any numbness. She denies any changes in terms of bowel movements or urination.    PREVIOUS RADIATION THERAPY: Yes - as above adjuvant radiation treatment to the left breast   PAST MEDICAL HISTORY:  has a past medical history of Breast cancer;  Asthma; COPD (chronic obstructive pulmonary disease); History of radiation therapy (05/27/2013-06/23/2013); Hypercholesteremia; Shortness of breath; Arthritis; PONV (postoperative nausea and vomiting); Neuropathy; History of transfusion; Difficulty sleeping; History of nonmelanoma skin cancer; Degenerative disc disease, lumbar; and Osteoarthritis.     PAST SURGICAL HISTORY: Past Surgical History  Procedure Laterality Date  . Colonoscopy w/ polypectomy  2004  . Breast lumpectomy with needle localization Left 04/21/2013    Procedure: LEFT BREAST WIRE GUIDED  LUMPECTOMY ;  Surgeon: Rolm Bookbinder, MD;  Location: [facility];  Service: General;  Laterality: Left;  . Tonsillectomy      as child  . Eye surgery Bilateral 2010    Cataract removal  . Total hip arthroplasty Left 11/18/2013    Procedure: LEFT TOTAL HIP ARTHROPLASTY ANTERIOR APPROACH;  Surgeon: Mauri Pole, MD;  Location: [facility];  Service: Orthopedics;  Laterality: Left;  . Femur im nail Right 11/22/2013    Procedure: INTRAMEDULLARY (IM) NAIL FEMORAL;  Surgeon: Mauri Pole, MD;  Location: [facility];  Service: Orthopedics;  Laterality: Right;  . Cataracts removed    . Conversion to total hip Right 04/21/2014    Procedure: CONVERSION COMPRESSION HIP SCREW TO TOTAL RIGHT HIP ARTHROPLASTY;  Surgeon: Mauri Pole, MD;  Location: [facility];  Service: Orthopedics;  Laterality: Right;  . Total hip arthroplasty Bilateral      FAMILY HISTORY: family history includes Heart Problems in her brother; Hypertension in her mother; Ovarian cancer in her sister.   SOCIAL HISTORY:  reports that she quit smoking about 11 years ago. Her smoking use included Cigarettes. She has a 41  pack-year smoking history. She has never used smokeless tobacco. She reports that she does not drink alcohol or use illicit drugs.   ALLERGIES: Celebrex and Hydrocodone   MEDICATIONS:  Current Facility-Administered Medications  Medication Dose Route Frequency Provider Last  Rate Last Dose  . dexamethasone (DECADRON) injection 4 mg  4 mg Intravenous Q12H Orson Eva, MD   4 mg at 03/12/15 1828  . diphenhydrAMINE (BENADRYL) capsule 25 mg  25 mg Oral Q6H PRN Orson Eva, MD      . methocarbamol (ROBAXIN) tablet 500 mg  500 mg Oral Q6H PRN Orson Eva, MD      . morphine 2 MG/ML injection 2 mg  2 mg Intravenous Q4H PRN Orson Eva, MD       Current Outpatient Prescriptions  Medication Sig Dispense Refill  . acetaminophen (TYLENOL) 500 MG tablet Take 500 mg by mouth every 6 (six) hours as needed for mild pain.    Marland Kitchen albuterol (PROVENTIL HFA;VENTOLIN HFA) 108 (90 BASE) MCG/ACT inhaler Inhale 2 puffs into the lungs every 4 (four) hours as needed for wheezing.     Marland Kitchen anastrozole (ARIMIDEX) 1 MG tablet Take 1 tablet (1 mg total) by mouth daily. 90 tablet 0  . cholecalciferol (VITAMIN D) 1000 UNITS tablet Take 1,000 Units by mouth 2 (two) times daily.    . diphenhydrAMINE (SOMINEX) 25 MG tablet Take 25 mg by mouth at bedtime as needed for allergies or sleep.    Marland Kitchen HYDROcodone-acetaminophen (NORCO) 7.5-325 MG per tablet Take 1-2 tablets by mouth every 4 (four) hours. 80 tablet 0  . ibuprofen (ADVIL,MOTRIN) 200 MG tablet Take 200 mg by mouth every 6 (six) hours as needed for mild pain or moderate pain.    . methocarbamol (ROBAXIN) 500 MG tablet Take 1 tablet (500 mg total) by mouth every 6 (six) hours as needed for muscle spasms. 90 tablet 0  . alum & mag hydroxide-simeth (MAALOX/MYLANTA) 200-200-20 MG/5ML suspension Take 30 mLs by mouth every 4 (four) hours as needed for indigestion. 355 mL 0  . diphenhydrAMINE (BENADRYL) 12.5 MG/5ML elixir Take 10 mLs (25 mg total) by mouth every 6 (six) hours as needed for itching. 120 mL 0  . docusate sodium 100 MG CAPS Take 100 mg by mouth 2 (two) times daily. 10 capsule 0  . ferrous sulfate 325 (65 FE) MG tablet Take 1 tablet (325 mg total) by mouth 3 (three) times daily after meals. 45 tablet 0  . guaiFENesin (MUCINEX) 600 MG 12 hr tablet Take 1  tablet (600 mg total) by mouth 2 (two) times daily as needed for to loosen phlegm. 30 tablet 0  . menthol-cetylpyridinium (CEPACOL) 3 MG lozenge Take 1 lozenge (3 mg total) by mouth as needed for sore throat (sore throat). 100 tablet 12  . polyethylene glycol (MIRALAX / GLYCOLAX) packet Take 17 g by mouth daily as needed for mild constipation. 14 each 0  . rivaroxaban (XARELTO) 10 MG TABS tablet Take 1 tablet (10 mg total) by mouth daily with breakfast. 12 tablet 0  . senna (SENOKOT) 8.6 MG TABS tablet Take 1 tablet (8.6 mg total) by mouth 2 (two) times daily. 120 each 0     REVIEW OF SYSTEMS:  A 15 point review of systems is documented in the electronic medical record. This was obtained by the nursing staff. However, I reviewed this with the patient to discuss relevant findings and make appropriate changes.  Pertinent items are noted in HPI.    PHYSICAL EXAM:  oral temperature  is 98.3 F (36.8 C). Her blood pressure is 164/93 and her pulse is 89. Her respiration is 16 and oxygen saturation is 97%.   ECOG = 2  0 - Asymptomatic (Fully active, able to carry on all predisease activities without restriction)  1 - Symptomatic but completely ambulatory (Restricted in physically strenuous activity but ambulatory and able to carry out work of a light or sedentary nature. For example, light housework, office work)  2 - Symptomatic, <50% in bed during the day (Ambulatory and capable of all self care but unable to carry out any work activities. Up and about more than 50% of waking hours)  3 - Symptomatic, >50% in bed, but not bedbound (Capable of only limited self-care, confined to bed or chair 50% or more of waking hours)  4 - Bedbound (Completely disabled. Cannot carry on any self-care. Totally confined to bed or chair)  5 - Death   Eustace Pen MM, Creech RH, Tormey DC, et al. [phone]). "Toxicity and response criteria of the [facility]". [place] Oncol. 5 (6): 649-55  The  patient was examined with her lying in the hospital bed in the emergency department today. I did not have her stand, but by report she has had difficulty with weightbearing due to pain and some degree potentially weakness. In a supine position, the patient had good dorsiflexion and plantar flexion bilaterally. She also had good proximal strength in terms of hip flexion bilaterally.   LABORATORY DATA:  Lab Results  Component Value Date   WBC 11.1* 03/12/2015   HGB 11.3* 03/12/2015   HCT 34.4* 03/12/2015   MCV 86.4 03/12/2015   PLT 171 03/12/2015   Lab Results  Component Value Date   NA 141 03/12/2015   K 2.9* 03/12/2015   CL 103 03/12/2015   CO2 26 04/24/2014   Lab Results  Component Value Date   ALT 11 10/28/2013   AST 16 10/28/2013   ALKPHOS 71 10/28/2013   BILITOT 0.36 10/28/2013      RADIOGRAPHY: Mr Lumbar Spine W Wo Contrast  03/12/2015   CLINICAL DATA:  Low back pain with lower extremity weakness. Sciatica.  EXAM: MRI LUMBAR SPINE WITHOUT AND WITH CONTRAST  TECHNIQUE: Multiplanar and multiecho pulse sequences of the lumbar spine were obtained without and with intravenous contrast.  CONTRAST:  65mL MULTIHANCE GADOBENATE DIMEGLUMINE 529 MG/ML IV SOLN  COMPARISON:  MRI dated 07/30/2009  FINDINGS: The conus tip at L1-2. There are numerous metastatic lesions in the liver. Both adrenal glands are enlarged, right more than left consistent with metastatic disease.  All of the visualized vertebral from T9 through L5 as well as the entire sacrum and over the visualized pelvic bones are riddled with metastatic lesions. There is a pathologic compression fracture of T10. There is no neural impingement at T10.  There is an enhancing 5 mm intradural intramedullary mass in the conus at L1. There are 2 small intradural extramedullary nodules posterior to the conus at L1.  Tumor essentially replaces the L4 vertebral body. The posterior elements of L3 are completely replaced by tumor and tumor does  extend into the posterior aspect of the spinal canal and compress the thecal sac at that the L3-4 level.  There is diffuse degenerative disc disease in the lumbar spine without disc protrusions. Slight retrolisthesis of L1 on L2, L2 on L3, and L3 on L4.  Moderate right foraminal stenosis at L4-5 and severe bilateral foraminal stenosis at L5-S1, right greater than left.  IMPRESSION:  1. Diffuse metastatic tumor throughout the entire visualized portion of the spine as well as the sacrum and visualized pelvic bones. 2. Pathologic fracture of T10 without neural impingement. 3. Extension of tumor into the spinal canal from the posterior elements of L3 with compression of the thecal sac to a moderately severe degree at L3-4. 4. Intradural intramedullary and extramedullary metastases in the thecal sac at L1. 5. Extensive metastatic disease in the liver. Probable metastatic disease in the adrenal glands.   Electronically Signed   By: Lorriane Shire M.D.   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 I discussed with the patient that this most likely represents breast cancer although a second cancer cannot be ruled out with metastatic involvement at this time. She has extensive disease within the spine with several areas in the lower thoracic and lumbar spine in particular of concern. I believe that she is appropriate for palliative radiation treatment to the spine and I discussed this with the patient.    PLAN:  -Continue Decadron over the weekend  -I will plan for the patient to undergo simulation on Monday such that we can begin treatment planning.  I anticipate beginning an approximate 2 week course of treatment hopefully Monday afternoon.  -Medical oncology consultation. -Recommend MRI scan of the cervical and thoracic spine. Given the extensive disease seen thus far, the patient may have additional concerning areas elsewhere within  the spine. -Defer to medical oncology regarding additional workup and possible biopsy. For my purpose, I do not believe I would need additional tissue to proceed with radiation treatment given her history of cancer and the extensive changes seen on her MRI scan.       ________________________________   Jodelle Gross, MD, PhD   **Disclaimer: This note was dictated with voice recognition software. Similar sounding words can inadvertently be transcribed and this note may contain transcription errors which may not have been corrected upon publication of note.**

## 2015-03-12 NOTE — ED Notes (Signed)
Floor nurse unable to take report at this time.

## 2015-03-12 NOTE — ED Notes (Signed)
Hospitalist at bedside 

## 2015-03-12 NOTE — ED Notes (Signed)
Phlebotomy at bedside.

## 2015-03-12 NOTE — ED Notes (Signed)
MRI called and sts that they are on their way to get pt now

## 2015-03-12 NOTE — ED Notes (Signed)
Order just placed for istat-creatinine after pt was transported to MRI. MRI was called and notified but reports that they are not using contrast. Will obtain after scan is complete.

## 2015-03-12 NOTE — ED Notes (Signed)
Bed: [facility] Expected date:  Expected time:  Means of arrival:  Comments: EMS-back pain

## 2015-03-12 NOTE — ED Notes (Signed)
Patient transported to MRI 

## 2015-03-12 NOTE — ED Notes (Signed)
Bed: [facility] Expected date:  Expected time:  Means of arrival:  Comments: EMS  

## 2015-03-12 NOTE — ED Notes (Signed)
Per EMS-patient has chronic back pain-twisted it in bed last week answering the phone-goes to ortho, wants further eval

## 2015-03-12 NOTE — ED Provider Notes (Signed)
CSN: [id number]     Arrival date & time 03/12/15  1023 History   First MD Initiated Contact with Patient 03/12/15 1037     Chief Complaint  Patient presents with  . Back Pain     (Consider location/radiation/quality/duration/timing/severity/associated sxs/prior Treatment) HPI The patient presents describing chronic back pain. She reports that she has had a long-standing problem with back pain and has had injections. She reports she injections only very temporarily helped. She states at this point her back pain feels much different than what she had typically experienced and has worsened to the point that she has significant weakness and impairment of her ability to walk. She states that things got significantly worse about a week ago when she had rolled over in bed to answer a phone and got a sudden severe pain in the center of her back. She has been getting around with a cane but it is becoming progressively more difficult. She denies recent illness with fever, chills or abdominal pain. She does not experience pain or burning with urination. Past Medical History  Diagnosis Date  . Breast cancer     left  . Asthma   . COPD (chronic obstructive pulmonary disease)   . History of radiation therapy 05/27/2013-06/23/2013    50 Gy to left breast  . Hypercholesteremia   . Shortness of breath   . Arthritis     hips  . PONV (postoperative nausea and vomiting)   . Neuropathy     feet  . History of transfusion     post op  . Difficulty sleeping   . History of nonmelanoma skin cancer   . Degenerative disc disease, lumbar   . Osteoarthritis    Past Surgical History  Procedure Laterality Date  . Colonoscopy w/ polypectomy  2004  . Breast lumpectomy with needle localization Left 04/21/2013    Procedure: LEFT BREAST WIRE GUIDED  LUMPECTOMY ;  Surgeon: Rolm Bookbinder, MD;  Location: [place];  Service: General;  Laterality: Left;  . Tonsillectomy      as child  . Eye surgery Bilateral 2010     Cataract removal  . Total hip arthroplasty Left 11/18/2013    Procedure: LEFT TOTAL HIP ARTHROPLASTY ANTERIOR APPROACH;  Surgeon: Mauri Pole, MD;  Location: [facility];  Service: Orthopedics;  Laterality: Left;  . Femur im nail Right 11/22/2013    Procedure: INTRAMEDULLARY (IM) NAIL FEMORAL;  Surgeon: Mauri Pole, MD;  Location: [facility];  Service: Orthopedics;  Laterality: Right;  . Cataracts removed    . Conversion to total hip Right 04/21/2014    Procedure: CONVERSION COMPRESSION HIP SCREW TO TOTAL RIGHT HIP ARTHROPLASTY;  Surgeon: Mauri Pole, MD;  Location: [facility];  Service: Orthopedics;  Laterality: Right;  . Total hip arthroplasty Bilateral    Family History  Problem Relation Age of Onset  . Ovarian cancer Sister   . Hypertension Mother   . Heart Problems Brother     Pacemaker   History  Substance Use Topics  . Smoking status: Former Smoker -- 1.00 packs/day for 41 years    Types: Cigarettes    Quit date: 05/08/2003  . Smokeless tobacco: Never Used  . Alcohol Use: No   OB History    Obstetric Comments   13 at first period Took estrogen for 2 years No childern     Review of Systems  10 Systems reviewed and are negative for acute change except as noted in the HPI.   Allergies  Celebrex and Hydrocodone  Home Medications   Prior to Admission medications   Medication Sig Start Date End Date Taking? Authorizing Provider  acetaminophen (TYLENOL) 500 MG tablet Take 500 mg by mouth every 6 (six) hours as needed for mild pain.   Yes Historical Provider, MD  albuterol (PROVENTIL HFA;VENTOLIN HFA) 108 (90 BASE) MCG/ACT inhaler Inhale 2 puffs into the lungs every 4 (four) hours as needed for wheezing.    Yes Historical Provider, MD  anastrozole (ARIMIDEX) 1 MG tablet Take 1 tablet (1 mg total) by mouth daily. 01/01/15  Yes Chauncey Cruel, MD  cholecalciferol (VITAMIN D) 1000 UNITS tablet Take 1,000 Units by mouth 2 (two) times daily.   Yes Historical Provider, MD   diphenhydrAMINE (SOMINEX) 25 MG tablet Take 25 mg by mouth at bedtime as needed for allergies or sleep.   Yes Historical Provider, MD  HYDROcodone-acetaminophen (NORCO) 7.5-325 MG per tablet Take 1-2 tablets by mouth every 4 (four) hours. 04/24/14  Yes Paralee Cancel, MD  ibuprofen (ADVIL,MOTRIN) 200 MG tablet Take 200 mg by mouth every 6 (six) hours as needed for mild pain or moderate pain.   Yes Historical Provider, MD  methocarbamol (ROBAXIN) 500 MG tablet Take 1 tablet (500 mg total) by mouth every 6 (six) hours as needed for muscle spasms. 04/24/14  Yes Paralee Cancel, MD  alum & mag hydroxide-simeth (MAALOX/MYLANTA) 200-200-20 MG/5ML suspension Take 30 mLs by mouth every 4 (four) hours as needed for indigestion. 04/24/14   Paralee Cancel, MD  diphenhydrAMINE (BENADRYL) 12.5 MG/5ML elixir Take 10 mLs (25 mg total) by mouth every 6 (six) hours as needed for itching. 04/24/14   Paralee Cancel, MD  docusate sodium 100 MG CAPS Take 100 mg by mouth 2 (two) times daily. 04/24/14   Paralee Cancel, MD  ferrous sulfate 325 (65 FE) MG tablet Take 1 tablet (325 mg total) by mouth 3 (three) times daily after meals. 04/24/14   Paralee Cancel, MD  guaiFENesin (MUCINEX) 600 MG 12 hr tablet Take 1 tablet (600 mg total) by mouth 2 (two) times daily as needed for to loosen phlegm. 04/24/14   Paralee Cancel, MD  menthol-cetylpyridinium (CEPACOL) 3 MG lozenge Take 1 lozenge (3 mg total) by mouth as needed for sore throat (sore throat). 04/24/14   Paralee Cancel, MD  polyethylene glycol [facility] / Floria Raveling) packet Take 17 g by mouth daily as needed for mild constipation. 04/24/14   Paralee Cancel, MD  rivaroxaban (XARELTO) 10 MG TABS tablet Take 1 tablet (10 mg total) by mouth daily with breakfast. 04/24/14   Paralee Cancel, MD  senna (SENOKOT) 8.6 MG TABS tablet Take 1 tablet (8.6 mg total) by mouth 2 (two) times daily. 04/24/14   Paralee Cancel, MD   BP 156/92 mmHg  Pulse 97  Temp(Src) 97.9 F (36.6 C) (Oral)  Resp 16  Ht 5\' 1"  (1.549 m)  Wt 122 lb  2.2 oz (55.4 kg)  BMI 23.09 kg/m2  SpO2 95% Physical Exam  Constitutional: She is oriented to person, place, and time. She appears well-developed and well-nourished.  The patient is alert and generally well in appearance. Her mental status is clear and she is interactive with no respiratory distress. She is in good physical condition for age.  HENT:  Head: Normocephalic and atraumatic.  Eyes: EOM are normal. Pupils are equal, round, and reactive to light.  Neck: Neck supple.  Cardiovascular: Normal rate, regular rhythm, normal heart sounds and intact distal pulses.   Pulmonary/Chest: Effort normal and breath sounds  normal.  Abdominal: Soft. Bowel sounds are normal. She exhibits no distension. There is no tenderness.  Musculoskeletal: Normal range of motion. She exhibits tenderness. She exhibits no edema.  Patient endorses some tenderness to palpation in the lower back particularly she identifies the sacroiliac regions. She does not have exquisite pain to focal palpation. There is no CVA tenderness.  Neurological: She is alert and oriented to person, place, and time. She has normal strength. Coordination normal. GCS eye subscore is 4. GCS verbal subscore is 5. GCS motor subscore is 6.  In a supine position the patient is able to extend and push against resistance with bilateral lower extremities. Sensation is intact to light touch. The patient is not ambulated.  Skin: Skin is warm, dry and intact.  Psychiatric: She has a normal mood and affect.    ED Course  Procedures (including critical care time) Labs Review Labs Reviewed  CBC WITH DIFFERENTIAL/PLATELET - Abnormal; Notable for the following:    WBC 11.1 (*)    Hemoglobin 11.3 (*)    HCT 34.4 (*)    RDW 15.9 (*)    Neutro Abs 7.9 (*)    Monocytes Absolute 1.4 (*)    All other components within normal limits  URINALYSIS, ROUTINE W REFLEX MICROSCOPIC - Abnormal; Notable for the following:    Hgb urine dipstick TRACE (*)    All other  components within normal limits  CBC - Abnormal; Notable for the following:    Hemoglobin 11.4 (*)    HCT 34.8 (*)    RDW 16.0 (*)    All other components within normal limits  COMPREHENSIVE METABOLIC PANEL - Abnormal; Notable for the following:    Glucose, Bld 122 (*)    Albumin 3.4 (*)    AST 47 (*)    ALT 65 (*)    Alkaline Phosphatase 167 (*)    GFR calc non Af Amer 78 (*)    All other components within normal limits  I-STAT CHEM 8, ED - Abnormal; Notable for the following:    Potassium 2.9 (*)    All other components within normal limits  MRSA PCR SCREENING  PROTIME-INR  MAGNESIUM  URINE MICROSCOPIC-ADD ON  I-STAT CREATININE, ED    Imaging Review Mr Lumbar Spine W Wo Contrast  03/12/2015   CLINICAL DATA:  Low back pain with lower extremity weakness. Sciatica.  EXAM: MRI LUMBAR SPINE WITHOUT AND WITH CONTRAST  TECHNIQUE: Multiplanar and multiecho pulse sequences of the lumbar spine were obtained without and with intravenous contrast.  CONTRAST:  75mL MULTIHANCE GADOBENATE DIMEGLUMINE 529 MG/ML IV SOLN  COMPARISON:  MRI dated 07/30/2009  FINDINGS: The conus tip at L1-2. There are numerous metastatic lesions in the liver. Both adrenal glands are enlarged, right more than left consistent with metastatic disease.  All of the visualized vertebral from T9 through L5 as well as the entire sacrum and over the visualized pelvic bones are riddled with metastatic lesions. There is a pathologic compression fracture of T10. There is no neural impingement at T10.  There is an enhancing 5 mm intradural intramedullary mass in the conus at L1. There are 2 small intradural extramedullary nodules posterior to the conus at L1.  Tumor essentially replaces the L4 vertebral body. The posterior elements of L3 are completely replaced by tumor and tumor does extend into the posterior aspect of the spinal canal and compress the thecal sac at that the L3-4 level.  There is diffuse degenerative disc disease in the  lumbar spine  without disc protrusions. Slight retrolisthesis of L1 on L2, L2 on L3, and L3 on L4.  Moderate right foraminal stenosis at L4-5 and severe bilateral foraminal stenosis at L5-S1, right greater than left.  IMPRESSION: 1. Diffuse metastatic tumor throughout the entire visualized portion of the spine as well as the sacrum and visualized pelvic bones. 2. Pathologic fracture of T10 without neural impingement. 3. Extension of tumor into the spinal canal from the posterior elements of L3 with compression of the thecal sac to a moderately severe degree at L3-4. 4. Intradural intramedullary and extramedullary metastases in the thecal sac at L1. 5. Extensive metastatic disease in the liver. Probable metastatic disease in the adrenal glands.   Electronically Signed   By: Lorriane Shire M.D.   On: 03/12/2015 14:28   Dg Chest Port 1 View  03/12/2015   CLINICAL DATA:  Patient states she came into the hospital with back pain. Patient denies any SOB, cough or chest pain at this time. Patient has a hx of COPD, asthma and breast cancer. Patient is a previous smoker and has been quit for 6 years and smoked a pack a day. Patient states she has had no surgeries to the heart or lungs.  Order comments: patient with metastatic cancer to spine with long hx of tobacco.  EXAM: PORTABLE CHEST - 1 VIEW  COMPARISON:  04/18/2013  FINDINGS: There is masslike fullness of the left hilum, new from the prior exam. Although this could potentially be vascular accentuated by technique, a mass or adenopathy is of concern. Followup chest CT is indicated.  Cardiac silhouette is normal in size. No mediastinal or right hilar mass or evidence of adenopathy.  Lungs are clear. No pleural effusion or pneumothorax. Surgical vascular clips project over the left breast shadow.  Bony thorax is demineralized.  IMPRESSION: 1. Left hilar fullness which may reflect a mass or adenopathy. Followup chest CT with contrast is recommended. 2. No acute  cardiopulmonary disease.   Electronically Signed   By: Lajean Manes M.D.   On: 03/12/2015 18:53     EKG Interpretation None      MDM   Final diagnoses:  Metastatic cancer   MRI was obtained based on the patient's history of progressive pain coupled with weakness and gait dysfunction. MRI has identified extensive metastatic disease which based on the patient's history of breast cancer is the suspected primary. MRI also indicates compression of the thecal sac and spinal metastases. The patient will be admitted for further diagnostic evaluation and evaluation for home and function with gait disorder.    Charlesetta Shanks, MD 03/13/15 [phone]

## 2015-03-12 NOTE — ED Notes (Signed)
MRI called and sts that it will be approx 1.5 hours until they can get to pt due to other scans

## 2015-03-12 NOTE — H&P (Signed)
History and Physical  Alexandra Snyder PNT:[id number] DOB: Nov 21, 1934 DOA: 03/12/2015   PCP: No primary care provider on file.   Chief Complaint: Back pain and leg weakness  HPI:  81 year old female with a history of COPD, iron deficiency anemia, and left-sided breast cancer presents with one-week history of worsening back pain and lower extremity weakness. The patient states that she has had back pain "for years". She has had some injections in her back in January and February 2016 which have given her some temporary relief. However in the past week her pain has progressed to the point where she is having bilateral leg weakness and having much difficulty ambulating secondary to the pain. She denies any bowel or bladder incontinence. Because of her pain, she came to the emergency department. MRI of the lumbar spine revealed diffuse metastatic disease from T9 vertebra to L5. There is also a T10 pathologic fracture without neural impingement. In addition, there was extensive tumor in the spinal canal causing moderate to severe degree of thecal sac compression at L3. There was also liver and adrenal metastasis. Remarkably, the patient's pain was controlled with tramadol and Ativan in the emergency department.  Patient denies fevers, chills, headache, chest pain, dyspnea, nausea, vomiting, diarrhea, abdominal pain, dysuria, hematuria. At baseline, the patient lives at home alone and she is able to ambulate with a cane.  In the emergency Department, WBC was 11.1, hemoglobin 11.3, platelets 171,000. Point of care BMP showed potassium 2.9, serum creatinine 0.80. 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 Lisbeth Renshaw -I  have consulted med onc--spoke with Dr. Lindi Adie -no cord level isolated on sensory exam -start dexamethasone -start opioids for pain -cathartics -CXR -UA -Defer further imaging to medical and radiation oncology -check LFTs invasive ductal carcinoma of the left breast -Status post lumpectomy and radiation-- last radiation 06/23/2013 -Continue Arimidex COPD  -Stable without respiratory distress  hypokalemia -Replete -Check magnesium Elevated blood pressure -Likely due to the patient's pain -Continue to monitor        Past Medical History  Diagnosis Date  . Breast cancer     left  . Asthma   . COPD (chronic obstructive pulmonary disease)   . History of radiation therapy 05/27/2013-06/23/2013    50 Gy to left breast  . Hypercholesteremia   . Shortness of breath   . Arthritis     hips  . PONV (postoperative nausea and vomiting)   . Neuropathy     feet  . History of transfusion     post op  . Difficulty sleeping   . History of nonmelanoma skin cancer   . Degenerative disc disease, lumbar   . Osteoarthritis    Past Surgical History  Procedure Laterality Date  . Colonoscopy w/ polypectomy  2004  . Breast lumpectomy with needle localization Left 04/21/2013    Procedure: LEFT BREAST WIRE GUIDED  LUMPECTOMY ;  Surgeon: Rolm Bookbinder, MD;  Location: [facility];  Service: General;  Laterality: Left;  . Tonsillectomy      as child  . Eye surgery Bilateral 2010    Cataract removal  . Total hip arthroplasty Left 11/18/2013    Procedure: LEFT TOTAL HIP ARTHROPLASTY ANTERIOR APPROACH;  Surgeon: Mauri Pole, MD;  Location: [facility];  Service: Orthopedics;  Laterality: Left;  . Femur im nail Right 11/22/2013    Procedure: INTRAMEDULLARY (IM) NAIL FEMORAL;  Surgeon: Mauri Pole, MD;  Location: [facility];  Service: Orthopedics;  Laterality: Right;  . Cataracts removed    . Conversion to total hip Right 04/21/2014    Procedure: CONVERSION COMPRESSION HIP SCREW TO TOTAL RIGHT HIP  ARTHROPLASTY;  Surgeon: Mauri Pole, MD;  Location: [facility];  Service: Orthopedics;  Laterality: Right;  . Total hip arthroplasty Bilateral    Social History:  reports that she quit smoking about 11 years ago. Her smoking use included Cigarettes. She has a 41 pack-year smoking history. She has never used smokeless tobacco. She reports that she does not drink alcohol or use illicit drugs.   Family History  Problem Relation Age of Onset  . Ovarian cancer Sister   . Hypertension Mother   . Heart Problems Brother     Pacemaker     Allergies  Allergen Reactions  . Celebrex [Celecoxib] Itching    Hands itch  . Hydrocodone Itching    Pt. states that she tolerates 1/2 to 1 tablet, but may need to take Benadryl if she takes 2 tablets.      Prior to Admission medications   Medication Sig Start Date End Date Taking? Authorizing Provider  acetaminophen (TYLENOL) 500 MG tablet Take 500 mg by mouth every 6 (six) hours as needed for mild pain.   Yes Historical Provider, MD  albuterol (PROVENTIL HFA;VENTOLIN HFA) 108 (90 BASE) MCG/ACT inhaler Inhale 2 puffs into the lungs every 4 (four) hours as needed for wheezing.    Yes Historical Provider, MD  anastrozole (ARIMIDEX) 1 MG tablet Take 1 tablet (1 mg total) by mouth daily. 01/01/15  Yes Chauncey Cruel, MD  cholecalciferol (VITAMIN D) 1000 UNITS tablet Take 1,000 Units by mouth 2 (two) times daily.   Yes Historical Provider, MD  diphenhydrAMINE (SOMINEX) 25 MG tablet Take 25 mg by mouth at bedtime as needed for allergies or sleep.   Yes Historical Provider, MD  HYDROcodone-acetaminophen (NORCO) 7.5-325 MG per tablet Take 1-2 tablets by mouth every 4 (four) hours. 04/24/14  Yes Paralee Cancel, MD  ibuprofen (ADVIL,MOTRIN) 200 MG tablet Take 200 mg by mouth every 6 (six) hours as needed for mild pain or moderate pain.   Yes Historical Provider, MD  methocarbamol (ROBAXIN) 500 MG tablet Take 1 tablet (500 mg total) by mouth every 6 (six) hours as  needed for muscle spasms. 04/24/14  Yes Paralee Cancel, MD  alum & mag hydroxide-simeth (MAALOX/MYLANTA) 200-200-20 MG/5ML suspension Take 30 mLs by mouth every 4 (four) hours as needed for indigestion. 04/24/14   Paralee Cancel, MD  diphenhydrAMINE (BENADRYL) 12.5 MG/5ML elixir Take 10 mLs (25 mg total) by mouth every 6 (six) hours as needed for itching. 04/24/14   Paralee Cancel, MD  docusate sodium 100 MG CAPS Take 100 mg by mouth 2 (two) times daily. 04/24/14   Paralee Cancel, MD  ferrous sulfate 325 (65 FE) MG tablet Take 1 tablet (325 mg total) by mouth 3 (three) times daily after meals. 04/24/14   Paralee Cancel, MD  guaiFENesin (MUCINEX) 600 MG 12 hr tablet Take 1 tablet (600 mg total) by mouth 2 (two) times daily as needed for to loosen phlegm. 04/24/14   Paralee Cancel, MD  menthol-cetylpyridinium (CEPACOL) 3 MG lozenge Take 1 lozenge (3 mg total) by mouth as needed for sore throat (sore throat). 04/24/14   Rodman Key  Alvan Dame, MD  polyethylene glycol (MIRALAX / GLYCOLAX) packet Take 17 g by mouth daily as needed for mild constipation. 04/24/14   Paralee Cancel, MD  rivaroxaban (XARELTO) 10 MG TABS tablet Take 1 tablet (10 mg total) by mouth daily with breakfast. 04/24/14   Paralee Cancel, MD  senna (SENOKOT) 8.6 MG TABS tablet Take 1 tablet (8.6 mg total) by mouth 2 (two) times daily. 04/24/14   Paralee Cancel, MD    Review of Systems:  Constitutional:  No weight loss, night sweats, Fevers, chills Head&Eyes: No headache.  No vision loss.  No eye pain or scotoma ENT:  No Difficulty swallowing,Tooth/dental problems,Sore throat,   Cardio-vascular:  No chest pain, Orthopnea, PND, swelling in lower extremities,  dizziness, palpitations  GI:  No  abdominal pain,vomiting, diarrhea, loss of appetite, hematochezia, melena, heartburn, indigestion, Resp:  No shortness of breath with exertion or at rest. No cough. No coughing up of blood .No wheezing.No chest wall deformity  Skin:  no rash or lesions.  GU:  no dysuria, change in  color of urine, no urgency or frequency. No flank pain.  Musculoskeletal:  No joint pain or swelling. No decreased range of motion.  Psych:  No change in mood or affect. No depression or anxiety. Neurologic: No headache, no dysesthesia, no focal weakness, no vision loss. No syncope  Physical Exam: Filed Vitals:   03/12/15 1027 03/12/15 1306  BP: 160/84 145/78  Pulse: 102 90  Temp: 98.2 F (36.8 C) 98.3 F (36.8 C)  TempSrc: Oral Oral  Resp: 18 16  SpO2: 97% 96%   General:  A&O x 3, NAD, nontoxic, pleasant/cooperative Head/Eye: No conjunctival hemorrhage, no icterus, [place]/AT, No nystagmus ENT:  No icterus,  No thrush, good dentition, no pharyngeal exudate Neck:  No masses, no lymphadenpathy, no bruits CV:  RRR, no rub, no gallop, no S3 Lung:  Diminished breath sounds but clear to auscultation Abdomen: soft/NT, +BS, nondistended, no peritoneal signs Ext: No cyanosis, No rashes, No petechiae, No lymphangitis, No edema Neuro: CNII-XII intact, strength 3/5 in bilateral lower extremities, no dysmetria.  Sensation intact b/l UE, LE  Labs on Admission:  Basic Metabolic Panel:  Recent Labs Lab 03/12/15 1343 03/12/15 1435  NA 141  --   K 2.9*  --   CL 103  --   GLUCOSE 98  --   BUN 21  --   CREATININE 0.80 0.80   Liver Function Tests: No results for input(s): AST, ALT, ALKPHOS, BILITOT, PROT, ALBUMIN in the last 168 hours. No results for input(s): LIPASE, AMYLASE in the last 168 hours. No results for input(s): AMMONIA in the last 168 hours. CBC:  Recent Labs Lab 03/12/15 1343 03/12/15 1426  WBC  --  11.1*  NEUTROABS  --  7.9*  HGB 12.9 11.3*  HCT 38.0 34.4*  MCV  --  86.4  PLT  --  171   Cardiac Enzymes: No results for input(s): CKTOTAL, CKMB, CKMBINDEX, TROPONINI in the last 168 hours. BNP: Invalid input(s): POCBNP CBG: No results for input(s): GLUCAP in the last 168 hours.  Radiological Exams on Admission: Mr Lumbar Spine W Wo Contrast  03/12/2015    CLINICAL DATA:  Low back pain with lower extremity weakness. Sciatica.  EXAM: MRI LUMBAR SPINE WITHOUT AND WITH CONTRAST  TECHNIQUE: Multiplanar and multiecho pulse sequences of the lumbar spine were obtained without and with intravenous contrast.  CONTRAST:  53mL MULTIHANCE GADOBENATE DIMEGLUMINE 529 MG/ML IV SOLN  COMPARISON:  MRI dated 07/30/2009  FINDINGS: The  conus tip at L1-2. There are numerous metastatic lesions in the liver. Both adrenal glands are enlarged, right more than left consistent with metastatic disease.  All of the visualized vertebral from T9 through L5 as well as the entire sacrum and over the visualized pelvic bones are riddled with metastatic lesions. There is a pathologic compression fracture of T10. There is no neural impingement at T10.  There is an enhancing 5 mm intradural intramedullary mass in the conus at L1. There are 2 small intradural extramedullary nodules posterior to the conus at L1.  Tumor essentially replaces the L4 vertebral body. The posterior elements of L3 are completely replaced by tumor and tumor does extend into the posterior aspect of the spinal canal and compress the thecal sac at that the L3-4 level.  There is diffuse degenerative disc disease in the lumbar spine without disc protrusions. Slight retrolisthesis of L1 on L2, L2 on L3, and L3 on L4.  Moderate right foraminal stenosis at L4-5 and severe bilateral foraminal stenosis at L5-S1, right greater than left.  IMPRESSION: 1. Diffuse metastatic tumor throughout the entire visualized portion of the spine as well as the sacrum and visualized pelvic bones. 2. Pathologic fracture of T10 without neural impingement. 3. Extension of tumor into the spinal canal from the posterior elements of L3 with compression of the thecal sac to a moderately severe degree at L3-4. 4. Intradural intramedullary and extramedullary metastases in the thecal sac at L1. 5. Extensive metastatic disease in the liver. Probable metastatic disease  in the adrenal glands.   Electronically Signed   By: Lorriane Shire M.D.   On: 03/12/2015 14:28        Time spent:65 minutes Code Status:   DNR Family Communication: No  Family at bedside   Tracy Gerken, DO  [facility] Pager [phone]  If 7PM-7AM, please contact night-coverage [email or web address] Password [facility] 03/12/2015, 4:02 PM

## 2015-03-12 NOTE — ED Notes (Signed)
Pt has visitor at bedside.

## 2015-03-12 NOTE — ED Notes (Signed)
Pt notified of wait time for MRI

## 2015-03-13 ENCOUNTER — Encounter (HOSPITAL_COMMUNITY): Payer: Self-pay | Admitting: Radiology

## 2015-03-13 ENCOUNTER — Inpatient Hospital Stay (HOSPITAL_COMMUNITY): Payer: Medicare Other

## 2015-03-13 DIAGNOSIS — C799 Secondary malignant neoplasm of unspecified site: Secondary | ICD-10-CM

## 2015-03-13 DIAGNOSIS — C50919 Malignant neoplasm of unspecified site of unspecified female breast: Secondary | ICD-10-CM

## 2015-03-13 DIAGNOSIS — C800 Disseminated malignant neoplasm, unspecified: Secondary | ICD-10-CM

## 2015-03-13 LAB — COMPREHENSIVE METABOLIC PANEL
ALT: 65 U/L — ABNORMAL HIGH (ref 0–35)
ANION GAP: 11 (ref 5–15)
AST: 47 U/L — AB (ref 0–37)
Albumin: 3.4 g/dL — ABNORMAL LOW (ref 3.5–5.2)
Alkaline Phosphatase: 167 U/L — ABNORMAL HIGH (ref 39–117)
BUN: 22 mg/dL (ref 6–23)
CO2: 27 mmol/L (ref 19–32)
Calcium: 9.2 mg/dL (ref 8.4–10.5)
Chloride: 105 mmol/L (ref 96–112)
Creatinine, Ser: 0.76 mg/dL (ref 0.50–1.10)
GFR calc non Af Amer: 78 mL/min — ABNORMAL LOW (ref >90)
Glucose, Bld: 122 mg/dL — ABNORMAL HIGH (ref 70–99)
POTASSIUM: 3.8 mmol/L (ref 3.5–5.1)
Sodium: 143 mmol/L (ref 135–145)
Total Bilirubin: 0.8 mg/dL (ref 0.3–1.2)
Total Protein: 6.8 g/dL (ref 6.0–8.3)

## 2015-03-13 LAB — URINALYSIS, ROUTINE W REFLEX MICROSCOPIC
BILIRUBIN URINE: NEGATIVE
Glucose, UA: NEGATIVE mg/dL
Ketones, ur: NEGATIVE mg/dL
LEUKOCYTES UA: NEGATIVE
Nitrite: NEGATIVE
PROTEIN: NEGATIVE mg/dL
Specific Gravity, Urine: 1.012 (ref 1.005–1.030)
UROBILINOGEN UA: 0.2 mg/dL (ref 0.0–1.0)
pH: 6 (ref 5.0–8.0)

## 2015-03-13 LAB — URINE MICROSCOPIC-ADD ON

## 2015-03-13 LAB — CBC
HEMATOCRIT: 34.8 % — AB (ref 36.0–46.0)
Hemoglobin: 11.4 g/dL — ABNORMAL LOW (ref 12.0–15.0)
MCH: 28.4 pg (ref 26.0–34.0)
MCHC: 32.8 g/dL (ref 30.0–36.0)
MCV: 86.6 fL (ref 78.0–100.0)
Platelets: 197 10*3/uL (ref 150–400)
RBC: 4.02 MIL/uL (ref 3.87–5.11)
RDW: 16 % — ABNORMAL HIGH (ref 11.5–15.5)
WBC: 9.6 10*3/uL (ref 4.0–10.5)

## 2015-03-13 LAB — MRSA PCR SCREENING: MRSA by PCR: NEGATIVE

## 2015-03-13 LAB — MAGNESIUM: MAGNESIUM: 1.8 mg/dL (ref 1.5–2.5)

## 2015-03-13 MED ORDER — IOHEXOL 300 MG/ML  SOLN
50.0000 mL | Freq: Once | INTRAMUSCULAR | Status: AC | PRN
  Administered 2015-03-13: 50 mL via ORAL

## 2015-03-13 MED ORDER — GADOBENATE DIMEGLUMINE 529 MG/ML IV SOLN
10.0000 mL | Freq: Once | INTRAVENOUS | Status: AC | PRN
  Administered 2015-03-13: 10 mL via INTRAVENOUS

## 2015-03-13 MED ORDER — IOHEXOL 300 MG/ML  SOLN
100.0000 mL | Freq: Once | INTRAMUSCULAR | Status: AC | PRN
  Administered 2015-03-13: 100 mL via INTRAVENOUS

## 2015-03-13 NOTE — Progress Notes (Signed)
Patient arrived from ED to room 1331. VSS. Patient with pain in legs/ left shoulder which are chronic. Oriented to room and to unit.

## 2015-03-13 NOTE — Plan of Care (Signed)
Problem: Phase I Progression Outcomes Goal: OOB as tolerated unless otherwise ordered Outcome: Progressing oob with assist

## 2015-03-13 NOTE — Consult Note (Signed)
[facility] CONSULT NOTE  No care team member to display  CHIEF COMPLAINTS/PURPOSE OF CONSULTATION:  Metastatic carcinoma  HISTORY OF PRESENTING ILLNESS:  Alexandra Snyder 81 y.o. female is here because of severe low back pain. She underwent an MRI in the emergency room which revealed extensive metastatic disease involving her entire spine in addition to that there were abnormalities in the liver as well as adrenal glands. She has a prior history of left-sided page 1 breast cancer treated with lumpectomy and adjuvant radiation therapy which was completed in June 2014. The tumor was 0.8 cm and it was grade 1 with low-grade DCIS. She then went on to receive antiestrogen therapy with anastrozole 1 mg daily. It appears that for a couple of months she may not have received antiestrogen therapy but overall she has been very compliant with this medication. She has had chronic low back pain ever since 2002 and has been seen by orthopedic and had previously been treated with injections locally. She had left hip replacement surgery November 2014. There was no pathology from that time. Right hip arthroplasty 2015  In January she underwent another injection to her back and since then she was unable to ambulate very well and continued to have extensive and severe back pain. MRI done 03/12/2015 revealed diffuse metastatic disease throughout the visualized portion of the spine as well as sacrum and pelvis, pathologic fracture T10 without neural impingement, extension of tumor into the spinal canal from the posterior elements of L3, intradural intramedullary and extramedullary metastases at L1 as well as extensive metastatic disease in the liver and probably an adrenal glands.  I reviewed her records extensively and collaborated the history with the patient.  MEDICAL HISTORY:  Past Medical History  Diagnosis Date  . Breast cancer     left  . Asthma   . COPD (chronic obstructive pulmonary disease)    . History of radiation therapy 05/27/2013-06/23/2013    50 Gy to left breast  . Hypercholesteremia   . Shortness of breath   . Arthritis     hips  . PONV (postoperative nausea and vomiting)   . Neuropathy     feet  . History of transfusion     post op  . Difficulty sleeping   . History of nonmelanoma skin cancer   . Degenerative disc disease, lumbar   . Osteoarthritis     SURGICAL HISTORY: Past Surgical History  Procedure Laterality Date  . Colonoscopy w/ polypectomy  2004  . Breast lumpectomy with needle localization Left 04/21/2013    Procedure: LEFT BREAST WIRE GUIDED  LUMPECTOMY ;  Surgeon: Rolm Bookbinder, MD;  Location: [facility];  Service: General;  Laterality: Left;  . Tonsillectomy      as child  . Eye surgery Bilateral 2010    Cataract removal  . Total hip arthroplasty Left 11/18/2013    Procedure: LEFT TOTAL HIP ARTHROPLASTY ANTERIOR APPROACH;  Surgeon: Mauri Pole, MD;  Location: [facility];  Service: Orthopedics;  Laterality: Left;  . Femur im nail Right 11/22/2013    Procedure: INTRAMEDULLARY (IM) NAIL FEMORAL;  Surgeon: Mauri Pole, MD;  Location: [facility];  Service: Orthopedics;  Laterality: Right;  . Cataracts removed    . Conversion to total hip Right 04/21/2014    Procedure: CONVERSION COMPRESSION HIP SCREW TO TOTAL RIGHT HIP ARTHROPLASTY;  Surgeon: Mauri Pole, MD;  Location: [facility];  Service: Orthopedics;  Laterality: Right;  . Total hip arthroplasty Bilateral  SOCIAL HISTORY: History   Social History  . Marital Status: Widowed    Spouse Name: N/A  . Number of Children: 0  . Years of Education: N/A   Occupational History  . Not on file.   Social History Main Topics  . Smoking status: Former Smoker -- 1.00 packs/day for 41 years    Types: Cigarettes    Quit date: 05/08/2003  . Smokeless tobacco: Never Used  . Alcohol Use: No  . Drug Use: No  . Sexual Activity: Not Currently    Birth Control/ Protection: Post-menopausal   Other Topics  Concern  . Not on file   Social History Narrative    FAMILY HISTORY: Family History  Problem Relation Age of Onset  . Ovarian cancer Sister   . Hypertension Mother   . Heart Problems Brother     Pacemaker    ALLERGIES:  is allergic to celebrex and hydrocodone.  MEDICATIONS:  Current Facility-Administered Medications  Medication Dose Route Frequency Provider Last Rate Last Dose  . acetaminophen (TYLENOL) tablet 650 mg  650 mg Oral Q6H PRN Orson Eva, MD   650 mg at 03/13/15 1856   Or  . acetaminophen (TYLENOL) suppository 650 mg  650 mg Rectal Q6H PRN Orson Eva, MD      . anastrozole (ARIMIDEX) tablet 1 mg  1 mg Oral Daily David Tat, MD      . dexamethasone (DECADRON) injection 4 mg  4 mg Intravenous Q12H Orson Eva, MD   4 mg at 03/12/15 1828  . diphenhydrAMINE (BENADRYL) capsule 25 mg  25 mg Oral Q6H PRN Orson Eva, MD      . HYDROcodone-acetaminophen (NORCO/VICODIN) 5-325 MG per tablet 0.5-1 tablet  0.5-1 tablet Oral Q4H PRN Orson Eva, MD      . methocarbamol (ROBAXIN) tablet 500 mg  500 mg Oral Q6H PRN Orson Eva, MD      . morphine 2 MG/ML injection 2 mg  2 mg Intravenous Q4H PRN Orson Eva, MD      . ondansetron (ZOFRAN) tablet 4 mg  4 mg Oral Q6H PRN Orson Eva, MD       Or  . ondansetron (ZOFRAN) injection 4 mg  4 mg Intravenous Q6H PRN Orson Eva, MD        REVIEW OF SYSTEMS:   Constitutional: Denies fevers, chills or abnormal night sweats Eyes: Denies blurriness of vision, double vision or watery eyes Ears, nose, mouth, throat, and face: Denies mucositis or sore throat Respiratory: Denies cough, dyspnea or wheezes Cardiovascular: Denies palpitation, chest discomfort or lower extremity swelling Gastrointestinal:  Denies nausea, heartburn or change in bowel habits Skin: Denies abnormal skin rashes Lymphatics: Denies new lymphadenopathy or easy bruising Neurological: Difficulty with ambulation and lower extremity weakness Behavioral/Psych: Mood is stable, no new changes   All other systems were reviewed with the patient and are negative.  PHYSICAL EXAMINATION: ECOG PERFORMANCE STATUS: 2 - Symptomatic, <50% confined to bed  Filed Vitals:   03/13/15 0629  BP: 156/92  Pulse:   Temp:   Resp:    Filed Weights   03/12/15 2015  Weight: 122 lb 2.2 oz (55.4 kg)    GENERAL:alert, no distress and comfortable SKIN: skin color, texture, turgor are normal, no rashes or significant lesions EYES: normal, conjunctiva are pink and non-injected, sclera clear OROPHARYNX:no exudate, no erythema and lips, buccal mucosa, and tongue normal  NECK: supple, thyroid normal size, non-tender, without nodularity LYMPH:  no palpable lymphadenopathy in the cervical, axillary or inguinal LUNGS: clear  to auscultation and percussion with normal breathing effort HEART: regular rate & rhythm and no murmurs and no lower extremity edema ABDOMEN:abdomen soft, non-tender and normal bowel sounds Musculoskeletal:no cyanosis of digits and no clubbing  PSYCH: alert & oriented x 3 with fluent speech NEURO: no focal motor/sensory deficits Breast:  no palpable abnormalities noted  LABORATORY DATA:  I have reviewed the data as listed Lab Results  Component Value Date   WBC 9.6 03/13/2015   HGB 11.4* 03/13/2015   HCT 34.8* 03/13/2015   MCV 86.6 03/13/2015   PLT 197 03/13/2015   Lab Results  Component Value Date   NA 143 03/13/2015   K 3.8 03/13/2015   CL 105 03/13/2015   CO2 27 03/13/2015    RADIOGRAPHIC STUDIES: I have personally reviewed the radiological reports and agreed with the findings in the report. MRI of the spine as reported before  ASSESSMENT AND PLAN:  1. Metastatic carcinoma: With a prior history of breast cancer that was T1b N0 M0 stage IA ER 100%, PR 0%, HER-2 negative treated with lumpectomy and radiation and adjuvant antiestrogen therapy with anastrozole. Patient has not been off this therapy for any great lengths of time. Therefore it is unusual for her to  develop metastatic disease while on antiestrogen therapy.   2. In order to get more information, we would like to obtain CT of her chest abdomen and pelvis and a bone scan for further assessment.   3. We would like to order a liver biopsy in order to get more definitive diagnosis as well as to get breast prognostic panel to determine what her treatment options are (assuming it is breast cancer).   4. Bone metastases: Patient has been seen by radiation oncology with plans to initiate radiation therapy. She is on steroids to help decrease inflammation. She will need bisphosphonate therapy which we can discuss as an outpatient.   Patient lives alone and gets assistance from her neighbors. She does not seem to have an extensive social network other than her church group as well as her neighbors.  Once we have more information I will discuss all treatment options with her.   Thank you much for consultation.  All questions were answered. The patient knows to call the clinic with any problems, questions or concerns.    Rulon Eisenmenger, MD 8:55 AM

## 2015-03-13 NOTE — Plan of Care (Signed)
Problem: Phase I Progression Outcomes Goal: Pain controlled with appropriate interventions Outcome: Progressing Patient rating pain 7-8 in left shoulder and legs. Patient states she does not like to take pain medication unless she "has to".

## 2015-03-13 NOTE — Progress Notes (Signed)
[facility] PROGRESS NOTE  Alexandra Snyder NWG:[id number] DOB: [birth year] DOA: 03/12/2015 PCP: No primary care provider on file.  Assessment/Plan: 1. Metastatic cancer.  -Patient with history of breast cancer T1b N0 M0 stage IA status post lumpectomy and radiation therapy who had been on anastrozole therapy. -She presents with complaints of back pain associate with bilateral weakness, as MRI of the lumbar spine showing diffuse metastatic disease involving T9-L5. Radiology also reported a T10 pathologic fracture.  -Dr. Lisbeth Renshaw of radiation oncology and Dr. Lindi Adie of medical oncology consulted -MRI of cervical thoracic spinal recommended as this study has been ordered. Will likely undergo radiation therapy -Plan for liver biopsy to obtain tissue diagnosis.  2.  Chronic obstructive point disease -Stable from a respiratory standpoint  3.  Pain management. -Patient reporting improvement to pain with the menstruation of IV steroids. She is also on morphine 2 mg every 4 hours as needed and Norco 5/325 one tablet every 4 hours as in it for severe breakthrough pain.  Code Status: DO NOT RESUSCITATE Family Communication:  Disposition Plan: Continue IV steroids, plan for biopsy of liver, likely to begin radiation therapy next week   Consultants:  Radiation oncology  Medical oncology    HPI/Subjective: Patient is a pleasant 81 year old female with a past medical history of placental breast cancer, history of COPD, who was admitted to the medicine service on 03/12/2015 presenting with complaints of bilateral lower extremity weakness and back pain. She had an MRI of lumbar spine which revealed diffuse metastatic disease from T9 to L5, with a T10 pathologic fracture without neural impingement. She was seen and evaluated by Dr. Lisbeth Renshaw of radiation oncology and Dr.Gudena of medical oncology. She was started on 4 mg IV every 12 hours.   Objective: Filed Vitals:   03/13/15 0629  BP: 156/92   Pulse:   Temp:   Resp:     Intake/Output Summary (Last 24 hours) at 03/13/15 1304 Last data filed at 03/13/15 0530  Gross per 24 hour  Intake    340 ml  Output    750 ml  Net   -410 ml   Filed Weights   03/12/15 2015  Weight: 55.4 kg (122 lb 2.2 oz)    Exam:   General:  Patient reports feeling better she is in no acute distress awake and alert  Cardiovascular: Regular rate and rhythm normal S1-S2 no murmurs rubs or gallops  Respiratory: Normal respiratory effort, lungs are clear to auscultation bilaterally  Abdomen: Soft nontender nondistended  Musculoskeletal: No edema  Neurological: Patient reporting improvement to bilateral lower extremity achiness, she. I have 4-5 muscle strain tube left and right lower extremities  Data Reviewed: Basic Metabolic Panel:  Recent Labs Lab 03/12/15 1343 03/12/15 1435 03/13/15 0440  NA 141  --  143  K 2.9*  --  3.8  CL 103  --  105  CO2  --   --  27  GLUCOSE 98  --  122*  BUN 21  --  22  CREATININE 0.80 0.80 0.76  CALCIUM  --   --  9.2  MG  --   --  1.8   Liver Function Tests:  Recent Labs Lab 03/13/15 0440  AST 47*  ALT 65*  ALKPHOS 167*  BILITOT 0.8  PROT 6.8  ALBUMIN 3.4*   No results for input(s): LIPASE, AMYLASE in the last 168 hours. No results for input(s): AMMONIA in the last 168 hours. CBC:  Recent Labs Lab 03/12/15 1343 03/12/15 1426 03/13/15  0440  WBC  --  11.1* 9.6  NEUTROABS  --  7.9*  --   HGB 12.9 11.3* 11.4*  HCT 38.0 34.4* 34.8*  MCV  --  86.4 86.6  PLT  --  171 197   Cardiac Enzymes: No results for input(s): CKTOTAL, CKMB, CKMBINDEX, TROPONINI in the last 168 hours. BNP (last 3 results) No results for input(s): BNP in the last 8760 hours.  ProBNP (last 3 results) No results for input(s): PROBNP in the last 8760 hours.  CBG: No results for input(s): GLUCAP in the last 168 hours.  Recent Results (from the past 240 hour(s))  MRSA PCR Screening     Status: None   Collection  Time: 03/13/15  5:08 AM  Result Value Ref Range Status   MRSA by PCR NEGATIVE NEGATIVE Final    Comment:        The GeneXpert MRSA Assay (FDA approved for NASAL specimens only), is one component of a comprehensive MRSA colonization surveillance program. It is not intended to diagnose MRSA infection nor to guide or monitor treatment for MRSA infections.      Studies: Mr Lumbar Spine W Wo Contrast  03/12/2015   CLINICAL DATA:  Low back pain with lower extremity weakness. Sciatica.  EXAM: MRI LUMBAR SPINE WITHOUT AND WITH CONTRAST  TECHNIQUE: Multiplanar and multiecho pulse sequences of the lumbar spine were obtained without and with intravenous contrast.  CONTRAST:  51mL MULTIHANCE GADOBENATE DIMEGLUMINE 529 MG/ML IV SOLN  COMPARISON:  MRI dated 07/30/2009  FINDINGS: The conus tip at L1-2. There are numerous metastatic lesions in the liver. Both adrenal glands are enlarged, right more than left consistent with metastatic disease.  All of the visualized vertebral from T9 through L5 as well as the entire sacrum and over the visualized pelvic bones are riddled with metastatic lesions. There is a pathologic compression fracture of T10. There is no neural impingement at T10.  There is an enhancing 5 mm intradural intramedullary mass in the conus at L1. There are 2 small intradural extramedullary nodules posterior to the conus at L1.  Tumor essentially replaces the L4 vertebral body. The posterior elements of L3 are completely replaced by tumor and tumor does extend into the posterior aspect of the spinal canal and compress the thecal sac at that the L3-4 level.  There is diffuse degenerative disc disease in the lumbar spine without disc protrusions. Slight retrolisthesis of L1 on L2, L2 on L3, and L3 on L4.  Moderate right foraminal stenosis at L4-5 and severe bilateral foraminal stenosis at L5-S1, right greater than left.  IMPRESSION: 1. Diffuse metastatic tumor throughout the entire visualized portion  of the spine as well as the sacrum and visualized pelvic bones. 2. Pathologic fracture of T10 without neural impingement. 3. Extension of tumor into the spinal canal from the posterior elements of L3 with compression of the thecal sac to a moderately severe degree at L3-4. 4. Intradural intramedullary and extramedullary metastases in the thecal sac at L1. 5. Extensive metastatic disease in the liver. Probable metastatic disease in the adrenal glands.   Electronically Signed   By: Lorriane Shire M.D.   On: 03/12/2015 14:28   Dg Chest Port 1 View  03/12/2015   CLINICAL DATA:  Patient states she came into the hospital with back pain. Patient denies any SOB, cough or chest pain at this time. Patient has a hx of COPD, asthma and breast cancer. Patient is a previous smoker and has been quit for 6 years  and smoked a pack a day. Patient states she has had no surgeries to the heart or lungs.  Order comments: patient with metastatic cancer to spine with long hx of tobacco.  EXAM: PORTABLE CHEST - 1 VIEW  COMPARISON:  04/18/2013  FINDINGS: There is masslike fullness of the left hilum, new from the prior exam. Although this could potentially be vascular accentuated by technique, a mass or adenopathy is of concern. Followup chest CT is indicated.  Cardiac silhouette is normal in size. No mediastinal or right hilar mass or evidence of adenopathy.  Lungs are clear. No pleural effusion or pneumothorax. Surgical vascular clips project over the left breast shadow.  Bony thorax is demineralized.  IMPRESSION: 1. Left hilar fullness which may reflect a mass or adenopathy. Followup chest CT with contrast is recommended. 2. No acute cardiopulmonary disease.   Electronically Signed   By: Lajean Manes M.D.   On: 03/12/2015 18:53    Scheduled Meds: . anastrozole  1 mg Oral Daily  . dexamethasone  4 mg Intravenous Q12H   Continuous Infusions:   Active Problems:   Anemia, iron deficiency   Leg weakness   Metastatic cancer to  spine   COPD with emphysema   Hypokalemia    Time spent: 35 minutes   Kelvin Cellar  [facility] Pager [phone]. If 7PM-7AM, please contact night-coverage at [email or web address], password [facility] 03/13/2015, 1:04 PM  LOS: 1 day

## 2015-03-13 NOTE — Progress Notes (Addendum)
Obtained report from [employer].

## 2015-03-14 DIAGNOSIS — Z515 Encounter for palliative care: Secondary | ICD-10-CM

## 2015-03-14 DIAGNOSIS — L299 Pruritus, unspecified: Secondary | ICD-10-CM

## 2015-03-14 DIAGNOSIS — G893 Neoplasm related pain (acute) (chronic): Secondary | ICD-10-CM | POA: Insufficient documentation

## 2015-03-14 DIAGNOSIS — D509 Iron deficiency anemia, unspecified: Secondary | ICD-10-CM

## 2015-03-14 DIAGNOSIS — C3490 Malignant neoplasm of unspecified part of unspecified bronchus or lung: Secondary | ICD-10-CM

## 2015-03-14 LAB — BASIC METABOLIC PANEL
Anion gap: 12 (ref 5–15)
BUN: 23 mg/dL (ref 6–23)
CHLORIDE: 101 mmol/L (ref 96–112)
CO2: 29 mmol/L (ref 19–32)
Calcium: 9.4 mg/dL (ref 8.4–10.5)
Creatinine, Ser: 0.75 mg/dL (ref 0.50–1.10)
GFR, EST NON AFRICAN AMERICAN: 78 mL/min — AB (ref >90)
Glucose, Bld: 156 mg/dL — ABNORMAL HIGH (ref 70–99)
Potassium: 3.8 mmol/L (ref 3.5–5.1)
Sodium: 142 mmol/L (ref 135–145)

## 2015-03-14 LAB — CBC
HEMATOCRIT: 38 % (ref 36.0–46.0)
HEMOGLOBIN: 12.4 g/dL (ref 12.0–15.0)
MCH: 28.4 pg (ref 26.0–34.0)
MCHC: 32.6 g/dL (ref 30.0–36.0)
MCV: 87.2 fL (ref 78.0–100.0)
Platelets: 229 10*3/uL (ref 150–400)
RBC: 4.36 MIL/uL (ref 3.87–5.11)
RDW: 16.3 % — AB (ref 11.5–15.5)
WBC: 13.7 10*3/uL — AB (ref 4.0–10.5)

## 2015-03-14 MED ORDER — AMLODIPINE BESYLATE 5 MG PO TABS
5.0000 mg | ORAL_TABLET | Freq: Every day | ORAL | Status: DC
  Administered 2015-03-14: 5 mg via ORAL
  Filled 2015-03-14: qty 1

## 2015-03-14 MED ORDER — TRAMADOL HCL 50 MG PO TABS
50.0000 mg | ORAL_TABLET | Freq: Four times a day (QID) | ORAL | Status: DC | PRN
  Administered 2015-03-14 – 2015-03-15 (×3): 50 mg via ORAL
  Filled 2015-03-14 (×3): qty 1

## 2015-03-14 MED ORDER — TRAMADOL HCL 50 MG PO TABS
50.0000 mg | ORAL_TABLET | Freq: Once | ORAL | Status: AC
  Administered 2015-03-14: 50 mg via ORAL
  Filled 2015-03-14: qty 1

## 2015-03-14 MED ORDER — AMLODIPINE BESYLATE 10 MG PO TABS
10.0000 mg | ORAL_TABLET | Freq: Every day | ORAL | Status: DC
  Administered 2015-03-15 – 2015-03-16 (×2): 10 mg via ORAL
  Filled 2015-03-14 (×2): qty 1

## 2015-03-14 MED ORDER — PANTOPRAZOLE SODIUM 40 MG PO TBEC
40.0000 mg | DELAYED_RELEASE_TABLET | Freq: Every day | ORAL | Status: DC
  Administered 2015-03-14 – 2015-03-16 (×3): 40 mg via ORAL
  Filled 2015-03-14 (×3): qty 1

## 2015-03-14 NOTE — Consult Note (Signed)
Patient Alexandra Snyder      DOB: 08/25/1934      DHR:[id number]     Consult Note from the Palliative Medicine Team at [facility] Requested by: Dr Coralyn Pear     PCP: No primary care provider on file. Reason for Consultation: Symptom Management, [place]     Phone Number:None  Assessment/Recommendations: 81 yo female with PMHx of Breast CA admitted with diffuse bony mets liver/pancreatic/adrenal mets with likely lung primary  1.  Code Status: DNR  2.GOC: Met with Alexandra Snyder today.  She expressed good understanding of her illness with likely lung cancer and degree of metastatic today. She talked with Dr Lindi Adie about disease and what to expect. She does not want to pursue bx or systemic chemotherapy.  She talks about her prognosis being likely in months.  She wants Dr Lindi Adie to talk with family about disease and what to expect (he reached out to them today).  I would be happy to assist in any way I can.  She is askign her brother and step son Alexandra Snyder to help put together living will. She understands that she is too weak to return home again and is planning on nursing facility care for the remainder of her life (will need SW/CM assistance here).  She has talked with other physicians about hospice care and is interested in this to ensure comfort as her disease progresses.  Hospice care certainly appropriate, but may be delayed in setting up until she completes radiation. I do not know of any hospice agencies in area that take people under hospice care while still receiving this. When she is discharged to a nursing facility, palliative care consultation can be requested and this should help with transition to hospice care after XRT is complete. I think given prognosis form her presumed lung CA, would be reasonable to dc arimidex.  Alexandra Snyder is remarkably stoic for all this new information and admits that she is still processing everything. Trying to be realistic and make the best of difficult situation.      3. Symptom Management:   1. Cancer Related Pain/Spine Mets: Tramadol/Tylenol/Steroids working well. Pruritis with hydrocodone.  If needing stronger pain medicine, consider oxycodone which may not cause prurutis.  Can consider bisphosphonate as well. i agree that XRT can help with pain management and also may help prevent cord compression.   2. Pruritis: d/c hydrocodone. PRN benadryl 3. GI PPx: will add protonix with steroids that she will be on for some time.     4. Psychosocial/Spiritual: Talked to her pastor last night.  Has a step son in [place] Alexandra Snyder) and another in [place] Alexandra Snyder).  2 brothers her in [place] and [place]).  Lives in [place] alone and planning on long-term care.  Formerly worked for [employer]. Widowed, her husband passed away 6 years ago.    Brief HPI: 81 yo female with PMHx of COPD, IDA, L breast CA on maintenance arimidex.  She was admitted on 3/15 with 1 week history of acute on chronic back pain with associated lower ext weakness and decreasing ability to ambulate.  She also has had some weight loss over the past few years.  No reported bowel/bladder incontinence or saddle anesthesia with above. She had MRI of her back revealing diffuse spinal mets as noted below.  Further imaging revealed L suprahilar mass with lesions concerning for metastasis to pancreas/liver/adrenals consistent with metastatic lung CA.  She has had discussions with Oncology and does not wish to pursue  bx or even the potential of systemic chemotherapy.  Currently her pain is very well managed with Tramadol/steroids/tylenol. She had pruritis develop with hydrocodone.  Eating well without n/v.  Denies insomnia, anxiety/depression, dyspnea, constipation/diarrhea.        PMH:  Past Medical History  Diagnosis Date  . Breast cancer     left  . Asthma   . COPD (chronic obstructive pulmonary disease)   . History of radiation therapy 05/27/2013-06/23/2013    50 Gy to left breast  . Hypercholesteremia   .  Shortness of breath   . Arthritis     hips  . PONV (postoperative nausea and vomiting)   . Neuropathy     feet  . History of transfusion     post op  . Difficulty sleeping   . History of nonmelanoma skin cancer   . Degenerative disc disease, lumbar   . Osteoarthritis      PSH: Past Surgical History  Procedure Laterality Date  . Colonoscopy w/ polypectomy  2004  . Breast lumpectomy with needle localization Left 04/21/2013    Procedure: LEFT BREAST WIRE GUIDED  LUMPECTOMY ;  Surgeon: Emelia Loron, MD;  Location: [facility];  Service: General;  Laterality: Left;  . Tonsillectomy      as child  . Eye surgery Bilateral 2010    Cataract removal  . Total hip arthroplasty Left 11/18/2013    Procedure: LEFT TOTAL HIP ARTHROPLASTY ANTERIOR APPROACH;  Surgeon: Shelda Pal, MD;  Location: [facility];  Service: Orthopedics;  Laterality: Left;  . Femur im nail Right 11/22/2013    Procedure: INTRAMEDULLARY (IM) NAIL FEMORAL;  Surgeon: Shelda Pal, MD;  Location: [facility];  Service: Orthopedics;  Laterality: Right;  . Cataracts removed    . Conversion to total hip Right 04/21/2014    Procedure: CONVERSION COMPRESSION HIP SCREW TO TOTAL RIGHT HIP ARTHROPLASTY;  Surgeon: Shelda Pal, MD;  Location: [facility];  Service: Orthopedics;  Laterality: Right;  . Total hip arthroplasty Bilateral    I have reviewed the FH and SH and  If appropriate update it with new information. Allergies  Allergen Reactions  . Celebrex [Celecoxib] Itching    Hands itch  . Hydrocodone Itching    Pt. states that she tolerates 1/2 to 1 tablet, but may need to take Benadryl if she takes 2 tablets.   Scheduled Meds: . [START ON 03/15/2015] amLODipine  10 mg Oral Daily  . anastrozole  1 mg Oral Daily  . dexamethasone  4 mg Intravenous Q12H   Continuous Infusions:  PRN Meds:.acetaminophen **OR** acetaminophen, diphenhydrAMINE, HYDROcodone-acetaminophen, methocarbamol, morphine injection, ondansetron **OR** ondansetron  (ZOFRAN) IV    BP 158/88 mmHg  Pulse 88  Temp(Src) 97.7 F (36.5 C) (Oral)  Resp 16  Ht 5\' 1"  (1.549 m)  Wt 55.4 kg (122 lb 2.2 oz)  BMI 23.09 kg/m2  SpO2 98%   PPS: 40   Intake/Output Summary (Last 24 hours) at 03/14/15 1412 Last data filed at 03/14/15 0610  Gross per 24 hour  Intake    790 ml  Output   1550 ml  Net   -760 ml    Physical Exam:  General:  Alert, NAD HEENT:  [place], sclera anicteric Neck: supple Chest:  CTAB CVS: regular rate Abdomen:soft, NT, +BS Ext: warm Neuro: moves all 4 ext   Labs: CBC    Component Value Date/Time   WBC 13.7* 03/14/2015 0430   WBC 7.6 10/28/2013 0946   RBC 4.36 03/14/2015 0430  RBC 2.82* 11/21/2013 1113   RBC 4.11 10/28/2013 0946   HGB 12.4 03/14/2015 0430   HGB 11.9 10/28/2013 0946   HCT 38.0 03/14/2015 0430   HCT 36.4 10/28/2013 0946   PLT 229 03/14/2015 0430   PLT 223 10/28/2013 0946   MCV 87.2 03/14/2015 0430   MCV 88.6 10/28/2013 0946   MCH 28.4 03/14/2015 0430   MCH 28.8 10/28/2013 0946   MCHC 32.6 03/14/2015 0430   MCHC 32.5 10/28/2013 0946   RDW 16.3* 03/14/2015 0430   RDW 14.9* 10/28/2013 0946   LYMPHSABS 1.8 03/12/2015 1426   LYMPHSABS 1.6 10/28/2013 0946   MONOABS 1.4* 03/12/2015 1426   MONOABS 1.0* 10/28/2013 0946   EOSABS 0.0 03/12/2015 1426   EOSABS 0.1 10/28/2013 0946   BASOSABS 0.0 03/12/2015 1426   BASOSABS 0.0 10/28/2013 0946    BMET    Component Value Date/Time   NA 142 03/14/2015 0430   NA 142 10/28/2013 0946   K 3.8 03/14/2015 0430   K 4.2 10/28/2013 0946   CL 101 03/14/2015 0430   CL 103 05/06/2013 0923   CO2 29 03/14/2015 0430   CO2 24 10/28/2013 0946   GLUCOSE 156* 03/14/2015 0430   GLUCOSE 90 10/28/2013 0946   GLUCOSE 92 05/06/2013 0923   BUN 23 03/14/2015 0430   BUN 19.3 10/28/2013 0946   CREATININE 0.75 03/14/2015 0430   CREATININE 0.8 10/28/2013 0946   CALCIUM 9.4 03/14/2015 0430   CALCIUM 10.4 10/28/2013 0946   GFRNONAA 78* 03/14/2015 0430   GFRAA >90 03/14/2015  0430    CMP     Component Value Date/Time   NA 142 03/14/2015 0430   NA 142 10/28/2013 0946   K 3.8 03/14/2015 0430   K 4.2 10/28/2013 0946   CL 101 03/14/2015 0430   CL 103 05/06/2013 0923   CO2 29 03/14/2015 0430   CO2 24 10/28/2013 0946   GLUCOSE 156* 03/14/2015 0430   GLUCOSE 90 10/28/2013 0946   GLUCOSE 92 05/06/2013 0923   BUN 23 03/14/2015 0430   BUN 19.3 10/28/2013 0946   CREATININE 0.75 03/14/2015 0430   CREATININE 0.8 10/28/2013 0946   CALCIUM 9.4 03/14/2015 0430   CALCIUM 10.4 10/28/2013 0946   PROT 6.8 03/13/2015 0440   PROT 8.0 10/28/2013 0946   ALBUMIN 3.4* 03/13/2015 0440   ALBUMIN 3.9 10/28/2013 0946   AST 47* 03/13/2015 0440   AST 16 10/28/2013 0946   ALT 65* 03/13/2015 0440   ALT 11 10/28/2013 0946   ALKPHOS 167* 03/13/2015 0440   ALKPHOS 71 10/28/2013 0946   BILITOT 0.8 03/13/2015 0440   BILITOT 0.36 10/28/2013 0946   GFRNONAA 78* 03/14/2015 0430   GFRAA >90 03/14/2015 0430  3/18 MRI Lumbar IMPRESSION: 1. Diffuse metastatic tumor throughout the entire visualized portion of the spine as well as the sacrum and visualized pelvic bones. 2. Pathologic fracture of T10 without neural impingement. 3. Extension of tumor into the spinal canal from the posterior elements of L3 with compression of the thecal sac to a moderately severe degree at L3-4. 4. Intradural intramedullary and extramedullary metastases in the thecal sac at L1. 5. Extensive metastatic disease in the liver. Probable metastatic disease in the adrenal glands.    3/19 MRI C/T Spine IMPRESSION: Widespread spinal metastatic disease without cervical or thoracic cord compression.  Intramedullary metastasis at T12 with suspected thoracic subarachnoid spread of tumor.  Pathologic compression fracture T10 with minimal retropulsion but no cord compression.  No significant epidural tumor is seen  in the cervical or thoracic spinal canal.  Cerebellar metastases with clival,  suspected calvarial, and possible dural invasion. MRI brain without and with contrast recommended.  Significant disease in the chest and abdomen is suspected. This will be better assessed on CT imaging.   3/19 CT Chest/Abd/Pelvis IMPRESSION: Chest Impression:  1. Large left suprahilar mass constricting the left main pulmonary artery and left upper lobe bronchus and extending into the mediastinum is most consistent with primary bronchogenic carcinoma. 2. Postobstructive collapse of the left upper lobe.  Abdomen / Pelvis Impression:  1. Widespread hepatic metastasis. 2. Multiple enlarged necrotic periportal metastatic lymph nodes. 3. Right adrenal gland metastasis. 4. Lesion within the head of the pancreas. Favor pancreatic metastasis. 5. Widespread skeletal metastasis throughout the spine and pelvis. 6. 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    Total Time: 65 minutes Greater than 50%  of this time was spent counseling and coordinating care related to the above assessment and plan.   Doran Clay D.O. Palliative Medicine Team at [facility]  Pager: [phone] Team Phone: [phone]

## 2015-03-14 NOTE — Progress Notes (Signed)
[facility] PROGRESS NOTE  Alexandra Snyder EXH:[id number] DOB: 1934-02-24 DOA: 03/12/2015 PCP: No primary care provider on file.  Assessment/Plan: 1. Metastatic cancer.  -Patient with history of breast cancer T1b N0 M0 stage IA status post lumpectomy and radiation therapy who had been on anastrozole therapy. -She presents with complaints of back pain associate with bilateral weakness, as MRI of the lumbar spine showing diffuse metastatic disease involving T9-L5. Radiology also reported a T10 pathologic fracture.  -Dr. Lisbeth Renshaw of radiation oncology and Dr. Lindi Adie of medical oncology consulted -CT scan of chest, abdomen and pelvis revealed large left suprahilar mass measuring 5.2 x 5.8 x 5.2 cm, per radiology appearing to be most consistent with primary bronchogenic carcinoma, with widespread metastatic disease involving liver, pancreas and adrenal gland, widespread skeletal metastasis. Patient expressing wishes to focus her care on comfort rather than undergoing potentially burdensome/invasive procedures if there would be no change in end point. Palliative care consulted   2.  Chronic obstructive point disease -Stable from a respiratory standpoint  3.  Pain management. -Patient reporting improvement to pain with the administration of IV steroids. She is also on morphine 2 mg every 4 hours as needed and Norco 5/325 one tablet every 4 hours as in it for severe breakthrough pain. -Palliative care consult for assist with pain management  4. Goals of Care -She expressed wishes to not undergo further chemotherapy, rather focus her care on comfort and quality of life. Palliative Care consulted.   Code Status: DO NOT RESUSCITATE Family Communication:  Disposition Plan: Continue IV steroids, palliative care consulted. Will likely need SNF placement   Consultants:  Radiation oncology  Medical oncology    HPI/Subjective: Patient is a pleasant 81 year old female with a past medical history of  placental breast cancer, history of COPD, who was admitted to the medicine service on 03/12/2015 presenting with complaints of bilateral lower extremity weakness and back pain. She had an MRI of lumbar spine which revealed diffuse metastatic disease from T9 to L5, with a T10 pathologic fracture without neural impingement. She was seen and evaluated by Dr. Lisbeth Renshaw of radiation oncology and Dr.Gudena of medical oncology. She was started on 4 mg IV every 12 hours.   Objective: Filed Vitals:   03/14/15 0614  BP: 158/88  Pulse:   Temp:   Resp:     Intake/Output Summary (Last 24 hours) at 03/14/15 1132 Last data filed at 03/14/15 0610  Gross per 24 hour  Intake    790 ml  Output   1550 ml  Net   -760 ml   Filed Weights   03/12/15 2015  Weight: 55.4 kg (122 lb 2.2 oz)    Exam:   General:  Patient reports difficulty sleeping at night, has ongoing back pain  Cardiovascular: Regular rate and rhythm normal S1-S2 no murmurs rubs or gallops  Respiratory: Normal respiratory effort, lungs are clear to auscultation bilaterally  Abdomen: Soft nontender nondistended  Musculoskeletal: No edema  Neurological: Patient reporting improvement to bilateral lower extremity achiness, she has 4-5 muscle strength to b/l legs  Data Reviewed: Basic Metabolic Panel:  Recent Labs Lab 03/12/15 1343 03/12/15 1435 03/13/15 0440 03/14/15 0430  NA 141  --  143 142  K 2.9*  --  3.8 3.8  CL 103  --  105 101  CO2  --   --  27 29  GLUCOSE 98  --  122* 156*  BUN 21  --  22 23  CREATININE 0.80 0.80 0.76 0.75  CALCIUM  --   --  9.2 9.4  MG  --   --  1.8  --    Liver Function Tests:  Recent Labs Lab 03/13/15 0440  AST 47*  ALT 65*  ALKPHOS 167*  BILITOT 0.8  PROT 6.8  ALBUMIN 3.4*   No results for input(s): LIPASE, AMYLASE in the last 168 hours. No results for input(s): AMMONIA in the last 168 hours. CBC:  Recent Labs Lab 03/12/15 1343 03/12/15 1426 03/13/15 0440 03/14/15 0430  WBC  --   11.1* 9.6 13.7*  NEUTROABS  --  7.9*  --   --   HGB 12.9 11.3* 11.4* 12.4  HCT 38.0 34.4* 34.8* 38.0  MCV  --  86.4 86.6 87.2  PLT  --  171 197 229   Cardiac Enzymes: No results for input(s): CKTOTAL, CKMB, CKMBINDEX, TROPONINI in the last 168 hours. BNP (last 3 results) No results for input(s): BNP in the last 8760 hours.  ProBNP (last 3 results) No results for input(s): PROBNP in the last 8760 hours.  CBG: No results for input(s): GLUCAP in the last 168 hours.  Recent Results (from the past 240 hour(s))  MRSA PCR Screening     Status: None   Collection Time: 03/13/15  5:08 AM  Result Value Ref Range Status   MRSA by PCR NEGATIVE NEGATIVE Final    Comment:        The GeneXpert MRSA Assay (FDA approved for NASAL specimens only), is one component of a comprehensive MRSA colonization surveillance program. It is not intended to diagnose MRSA infection nor to guide or monitor treatment for MRSA infections.      Studies: Ct Chest W Contrast  03/13/2015   CLINICAL DATA:  81 year old female with a history of COPD, iron deficiency anemia, and left-sided breast cancer presents with one-week history of worsening back pain and lower extremity weakness. The patient states that she has had back pain "for years". She has had some injections in her back in January and February 2016 which have given her some temporary relief. However in the past week her pain has progressed to the point where she is having bilateral leg weakness and having much difficulty ambulating secondary to the pain. She denies any bowel or bladder incontinence. Because of her pain, she came to the emergency department. MRI of the lumbar spine revealed diffuse metastatic disease from T9 vertebra to L5. There is also a T10 pathologic fracture without neural impingement. In addition, there was extensive tumor in the spinal canal causing moderate to severe degree of thecal sac compression at L3. There was also liver and  adrenal metastasis.  EXAM: CT CHEST, ABDOMEN, AND PELVIS WITH CONTRAST  TECHNIQUE: Multidetector CT imaging of the chest, abdomen and pelvis was performed following the standard protocol during bolus administration of intravenous contrast.  CONTRAST:  54mL OMNIPAQUE IOHEXOL 300 MG/ML SOLN, 129mL OMNIPAQUE IOHEXOL 300 MG/ML SOLN  COMPARISON:  MRI cervical spine 03/13/2015, MRI lumbar spine 03/12/2015  FINDINGS: CT CHEST FINDINGS  Mediastinum/Nodes: There is a left suprahilar mass surrounding and distorting the left main pulmonary artery. This mass measures approximately 5.2 x 5.8 x 5.2 cm. The mass extends into the AP window and central mediastinum to the level of the mid trachea. The mass surrounds and constricts the left upper lobe bronchus. There is collapse of the left upper lobe.  There is no additional discrete mediastinal adenopathy. Prevascular node measures 9 mm is likely metastatic  The left lobe of thyroid gland is lobular extends into the mediastinum.  Lungs/Pleura: Left upper lobe collapse related to the left supra hilar mass.  CT ABDOMEN AND PELVIS FINDINGS  Hepatobiliary: Widespread hepatic metastasis with round low-density enhancing lesions. There approximately 30 lesions within the liver. Example lesion in the left hepatic lobe measures 2.5 cm. Example lesion in the right hepatic lobe measures 3.0 cm (image 57, series 2)  There is bulky periportal metastatic necrotic lymphadenopathy.  Pancreas: There is alesion in the pancreatic head measuring 19 mm (image 66, series 2). No pancreatic duct dilatation dilatation.  Spleen: Normal spleen  Adrenals/Urinary Tract: Right adrenal gland metastasis measures 2.2 x 3.1 cm. Low-density lesions of the left renal cortex. No renal obstruction. Bladder is difficult to evaluate due to bilateral hip prosthetics.  Stomach/Bowel: The stomach, small bowel, appendix, and colon are normal.  Vascular/Lymphatic: The aorta normal caliber. There is no retroperitoneal  adenopathy. Extensive periportal lymphadenopathy.  Reproductive: Uterus is small. Difficult to evaluate the pelvis due to streak artifact from the hip prosthetics. No clear pelvic adenopathy.  Other: No peritoneal carcinomatosis.  Musculoskeletal: Widespread skeletal metastasis with multiple lytic and sclerotic lesions within the iliac bones, sacrum, and throughout the spine. Epidural tumor at L 3 is much better depicted on MRI.  IMPRESSION: Chest Impression:  1. Large left suprahilar mass constricting the left main pulmonary artery and left upper lobe bronchus and extending into the mediastinum is most consistent with primary bronchogenic carcinoma. 2. Postobstructive collapse of the left upper lobe.  Abdomen / Pelvis Impression:  1. Widespread hepatic metastasis. 2. Multiple enlarged necrotic periportal metastatic lymph nodes. 3. Right adrenal gland metastasis. 4. Lesion within the head of the pancreas. Favor pancreatic metastasis. 5. Widespread skeletal metastasis throughout the spine and pelvis. 6. 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   Electronically Signed   By: Suzy Bouchard M.D.   On: 03/13/2015 18:26   Mr Cervical Spine W Wo Contrast  03/13/2015   CLINICAL DATA:  Breast cancer with new lower extremity weakness. Initial encounter.  EXAM: MRI CERVICAL AND THORACIC SPINE WITHOUT AND WITH CONTRAST  TECHNIQUE: Multiplanar and multiecho pulse sequences of the cervical spine, to include the craniocervical junction and cervicothoracic junction, and lumbar spine, were obtained without and with intravenous contrast.  CONTRAST:  20mL MULTIHANCE GADOBENATE DIMEGLUMINE 529 MG/ML IV SOLN  COMPARISON:  MRI LUMBAR SPINE REPORTED SEPARATELY 03/12/2015.  FINDINGS: MRI CERVICAL SPINE FINDINGS  The patient was unable to remain motionless for the exam. Small or subtle lesions could be overlooked.  Metastatic lesions are seen throughout all cervical vertebrae.  There is complete replacement at the C6 and C7 vertebral bodies, at risk for pathologic fracture. No epidural tumor. Minor cervical disc disease. Craniocervical junction unremarkable.  Limited visualization of the intracranial compartment shows multiple cerebellar metastases which appear intra-axial. There may be subarachnoid spread of tumor along the dorsal aspect of the cerebellum. There appears to be pachymeningeal thickening of the dura ventral to the pons. The clivus is abnormal with tumor replacement. Suspected calvarial metastases. MRI brain imaging is recommended for further evaluation.  MRI THORACIC SPINE FINDINGS  Numbering of the thoracic spine was confirmed by counting down from the odontoid.  Widespread metastatic disease is seen throughout all thoracic vertebrae. There is early pathologic compression of the T10 vertebral body. There is minimal posterior protrusion of tumor into the ventral epidural space but no significant cord compression. No epidural tumor at any location in the thoracic spine. No thoracic disc protrusion.  Redemonstrated is  an intramedullary metastasis opposite the T12 vertebral body. This was better visualized on prior MR from yesterday. Subarachnoid spread of tumor over the dorsal conus was better visualized also on yesterday's exam. There are subtle areas on postcontrast imaging which suggests subarachnoid spread of tumor may be present over the mid to upper thoracic region, but unfortunately cannot be confirmed on postcontrast axial images. Areas of concern for subarachnoid coating of tumor along the cord are best seen on image 8 series 16 postcontrast sagittal.  No paravertebral masses. LEFT upper lobe atelectatic change is observed,, with LEFT hilar mass, incompletely evaluated. Widespread liver metastases. Suspected BILATERAL adrenal metastases. CT chest abdomen pelvis will be reported separately.  IMPRESSION: Widespread spinal metastatic disease without cervical or thoracic  cord compression.  Intramedullary metastasis at T12 with suspected thoracic subarachnoid spread of tumor.  Pathologic compression fracture T10 with minimal retropulsion but no cord compression.  No significant epidural tumor is seen in the cervical or thoracic spinal canal.  Cerebellar metastases with clival, suspected calvarial, and possible dural invasion. MRI brain without and with contrast recommended.  Significant disease in the chest and abdomen is suspected. This will be better assessed on CT imaging.   Electronically Signed   By: Rolla Flatten M.D.   On: 03/13/2015 13:33   Mr Thoracic Spine W Wo Contrast  03/13/2015   CLINICAL DATA:  Breast cancer with new lower extremity weakness. Initial encounter.  EXAM: MRI CERVICAL AND THORACIC SPINE WITHOUT AND WITH CONTRAST  TECHNIQUE: Multiplanar and multiecho pulse sequences of the cervical spine, to include the craniocervical junction and cervicothoracic junction, and lumbar spine, were obtained without and with intravenous contrast.  CONTRAST:  39mL MULTIHANCE GADOBENATE DIMEGLUMINE 529 MG/ML IV SOLN  COMPARISON:  MRI LUMBAR SPINE REPORTED SEPARATELY 03/12/2015.  FINDINGS: MRI CERVICAL SPINE FINDINGS  The patient was unable to remain motionless for the exam. Small or subtle lesions could be overlooked.  Metastatic lesions are seen throughout all cervical vertebrae. There is complete replacement at the C6 and C7 vertebral bodies, at risk for pathologic fracture. No epidural tumor. Minor cervical disc disease. Craniocervical junction unremarkable.  Limited visualization of the intracranial compartment shows multiple cerebellar metastases which appear intra-axial. There may be subarachnoid spread of tumor along the dorsal aspect of the cerebellum. There appears to be pachymeningeal thickening of the dura ventral to the pons. The clivus is abnormal with tumor replacement. Suspected calvarial metastases. MRI brain imaging is recommended for further evaluation.  MRI  THORACIC SPINE FINDINGS  Numbering of the thoracic spine was confirmed by counting down from the odontoid.  Widespread metastatic disease is seen throughout all thoracic vertebrae. There is early pathologic compression of the T10 vertebral body. There is minimal posterior protrusion of tumor into the ventral epidural space but no significant cord compression. No epidural tumor at any location in the thoracic spine. No thoracic disc protrusion.  Redemonstrated is an intramedullary metastasis opposite the T12 vertebral body. This was better visualized on prior MR from yesterday. Subarachnoid spread of tumor over the dorsal conus was better visualized also on yesterday's exam. There are subtle areas on postcontrast imaging which suggests subarachnoid spread of tumor may be present over the mid to upper thoracic region, but unfortunately cannot be confirmed on postcontrast axial images. Areas of concern for subarachnoid coating of tumor along the cord are best seen on image 8 series 16 postcontrast sagittal.  No paravertebral masses. LEFT upper lobe atelectatic change is observed,, with LEFT hilar mass, incompletely evaluated. Widespread liver  metastases. Suspected BILATERAL adrenal metastases. CT chest abdomen pelvis will be reported separately.  IMPRESSION: Widespread spinal metastatic disease without cervical or thoracic cord compression.  Intramedullary metastasis at T12 with suspected thoracic subarachnoid spread of tumor.  Pathologic compression fracture T10 with minimal retropulsion but no cord compression.  No significant epidural tumor is seen in the cervical or thoracic spinal canal.  Cerebellar metastases with clival, suspected calvarial, and possible dural invasion. MRI brain without and with contrast recommended.  Significant disease in the chest and abdomen is suspected. This will be better assessed on CT imaging.   Electronically Signed   By: Rolla Flatten M.D.   On: 03/13/2015 13:33   Mr Lumbar Spine W  Wo Contrast  03/12/2015   CLINICAL DATA:  Low back pain with lower extremity weakness. Sciatica.  EXAM: MRI LUMBAR SPINE WITHOUT AND WITH CONTRAST  TECHNIQUE: Multiplanar and multiecho pulse sequences of the lumbar spine were obtained without and with intravenous contrast.  CONTRAST:  49mL MULTIHANCE GADOBENATE DIMEGLUMINE 529 MG/ML IV SOLN  COMPARISON:  MRI dated 07/30/2009  FINDINGS: The conus tip at L1-2. There are numerous metastatic lesions in the liver. Both adrenal glands are enlarged, right more than left consistent with metastatic disease.  All of the visualized vertebral from T9 through L5 as well as the entire sacrum and over the visualized pelvic bones are riddled with metastatic lesions. There is a pathologic compression fracture of T10. There is no neural impingement at T10.  There is an enhancing 5 mm intradural intramedullary mass in the conus at L1. There are 2 small intradural extramedullary nodules posterior to the conus at L1.  Tumor essentially replaces the L4 vertebral body. The posterior elements of L3 are completely replaced by tumor and tumor does extend into the posterior aspect of the spinal canal and compress the thecal sac at that the L3-4 level.  There is diffuse degenerative disc disease in the lumbar spine without disc protrusions. Slight retrolisthesis of L1 on L2, L2 on L3, and L3 on L4.  Moderate right foraminal stenosis at L4-5 and severe bilateral foraminal stenosis at L5-S1, right greater than left.  IMPRESSION: 1. Diffuse metastatic tumor throughout the entire visualized portion of the spine as well as the sacrum and visualized pelvic bones. 2. Pathologic fracture of T10 without neural impingement. 3. Extension of tumor into the spinal canal from the posterior elements of L3 with compression of the thecal sac to a moderately severe degree at L3-4. 4. Intradural intramedullary and extramedullary metastases in the thecal sac at L1. 5. Extensive metastatic disease in the liver.  Probable metastatic disease in the adrenal glands.   Electronically Signed   By: Lorriane Shire M.D.   On: 03/12/2015 14:28   Ct Abdomen Pelvis W Contrast  03/13/2015   CLINICAL DATA:  81 year old female with a history of COPD, iron deficiency anemia, and left-sided breast cancer presents with one-week history of worsening back pain and lower extremity weakness. The patient states that she has had back pain "for years". She has had some injections in her back in January and February 2016 which have given her some temporary relief. However in the past week her pain has progressed to the point where she is having bilateral leg weakness and having much difficulty ambulating secondary to the pain. She denies any bowel or bladder incontinence. Because of her pain, she came to the emergency department. MRI of the lumbar spine revealed diffuse metastatic disease from T9 vertebra to L5. There is also  a T10 pathologic fracture without neural impingement. In addition, there was extensive tumor in the spinal canal causing moderate to severe degree of thecal sac compression at L3. There was also liver and adrenal metastasis.  EXAM: CT CHEST, ABDOMEN, AND PELVIS WITH CONTRAST  TECHNIQUE: Multidetector CT imaging of the chest, abdomen and pelvis was performed following the standard protocol during bolus administration of intravenous contrast.  CONTRAST:  60mL OMNIPAQUE IOHEXOL 300 MG/ML SOLN, 187mL OMNIPAQUE IOHEXOL 300 MG/ML SOLN  COMPARISON:  MRI cervical spine 03/13/2015, MRI lumbar spine 03/12/2015  FINDINGS: CT CHEST FINDINGS  Mediastinum/Nodes: There is a left suprahilar mass surrounding and distorting the left main pulmonary artery. This mass measures approximately 5.2 x 5.8 x 5.2 cm. The mass extends into the AP window and central mediastinum to the level of the mid trachea. The mass surrounds and constricts the left upper lobe bronchus. There is collapse of the left upper lobe.  There is no additional discrete  mediastinal adenopathy. Prevascular node measures 9 mm is likely metastatic  The left lobe of thyroid gland is lobular extends into the mediastinum.  Lungs/Pleura: Left upper lobe collapse related to the left supra hilar mass.  CT ABDOMEN AND PELVIS FINDINGS  Hepatobiliary: Widespread hepatic metastasis with round low-density enhancing lesions. There approximately 30 lesions within the liver. Example lesion in the left hepatic lobe measures 2.5 cm. Example lesion in the right hepatic lobe measures 3.0 cm (image 57, series 2)  There is bulky periportal metastatic necrotic lymphadenopathy.  Pancreas: There is alesion in the pancreatic head measuring 19 mm (image 66, series 2). No pancreatic duct dilatation dilatation.  Spleen: Normal spleen  Adrenals/Urinary Tract: Right adrenal gland metastasis measures 2.2 x 3.1 cm. Low-density lesions of the left renal cortex. No renal obstruction. Bladder is difficult to evaluate due to bilateral hip prosthetics.  Stomach/Bowel: The stomach, small bowel, appendix, and colon are normal.  Vascular/Lymphatic: The aorta normal caliber. There is no retroperitoneal adenopathy. Extensive periportal lymphadenopathy.  Reproductive: Uterus is small. Difficult to evaluate the pelvis due to streak artifact from the hip prosthetics. No clear pelvic adenopathy.  Other: No peritoneal carcinomatosis.  Musculoskeletal: Widespread skeletal metastasis with multiple lytic and sclerotic lesions within the iliac bones, sacrum, and throughout the spine. Epidural tumor at L 3 is much better depicted on MRI.  IMPRESSION: Chest Impression:  1. Large left suprahilar mass constricting the left main pulmonary artery and left upper lobe bronchus and extending into the mediastinum is most consistent with primary bronchogenic carcinoma. 2. Postobstructive collapse of the left upper lobe.  Abdomen / Pelvis Impression:  1. Widespread hepatic metastasis. 2. Multiple enlarged necrotic periportal metastatic lymph  nodes. 3. Right adrenal gland metastasis. 4. Lesion within the head of the pancreas. Favor pancreatic metastasis. 5. Widespread skeletal metastasis throughout the spine and pelvis. 6. 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   Electronically Signed   By: Suzy Bouchard M.D.   On: 03/13/2015 18:26   Dg Chest Port 1 View  03/12/2015   CLINICAL DATA:  Patient states she came into the hospital with back pain. Patient denies any SOB, cough or chest pain at this time. Patient has a hx of COPD, asthma and breast cancer. Patient is a previous smoker and has been quit for 6 years and smoked a pack a day. Patient states she has had no surgeries to the heart or lungs.  Order comments: patient with metastatic cancer to spine with long  hx of tobacco.  EXAM: PORTABLE CHEST - 1 VIEW  COMPARISON:  04/18/2013  FINDINGS: There is masslike fullness of the left hilum, new from the prior exam. Although this could potentially be vascular accentuated by technique, a mass or adenopathy is of concern. Followup chest CT is indicated.  Cardiac silhouette is normal in size. No mediastinal or right hilar mass or evidence of adenopathy.  Lungs are clear. No pleural effusion or pneumothorax. Surgical vascular clips project over the left breast shadow.  Bony thorax is demineralized.  IMPRESSION: 1. Left hilar fullness which may reflect a mass or adenopathy. Followup chest CT with contrast is recommended. 2. No acute cardiopulmonary disease.   Electronically Signed   By: Lajean Manes M.D.   On: 03/12/2015 18:53    Scheduled Meds: . [START ON 03/15/2015] amLODipine  10 mg Oral Daily  . anastrozole  1 mg Oral Daily  . dexamethasone  4 mg Intravenous Q12H   Continuous Infusions:   Active Problems:   Anemia, iron deficiency   Leg weakness   Metastatic cancer to spine   COPD with emphysema   Hypokalemia   Metastasis from malignant tumor of breast    Time spent: 35  minutes   Kelvin Cellar  [facility] Pager [phone]. If 7PM-7AM, please contact night-coverage at [email or web address], password [facility] 03/14/2015, 11:32 AM  LOS: 2 days

## 2015-03-14 NOTE — Progress Notes (Signed)
SUBJECTIVE: Continued problems with pain involving her bones of shoulder, lower back and also throughout her body.   OBJECTIVE PHYSICAL EXAMINATION: ECOG PERFORMANCE STATUS: 3 - Symptomatic, >50% confined to bed  Filed Vitals:   03/14/15 0614  BP: 158/88  Pulse:   Temp:   Resp:    Filed Weights   03/12/15 2015  Weight: 122 lb 2.2 oz (55.4 kg)    GENERAL: Awake, alert, and oriented SKIN: skin color, texture, turgor are normal, no rashes or significant lesions EYES: normal, Conjunctiva are pink and non-injected, sclera clear OROPHARYNX:no exudate, no erythema and lips, buccal mucosa, and tongue normal  NECK: supple, thyroid normal size, non-tender, without nodularity LUNGS: clear to auscultation  HEART: regular rate & rhythm and no murmurs and no lower extremity edema ABDOMEN:abdomen soft, non-tender and normal bowel sounds NEURO: alert & oriented x 3 with fluent speech, lower extremity weakness  LABORATORY DATA:  I have reviewed the data as listed CMP Latest Ref Rng 03/14/2015 03/13/2015 03/12/2015  Glucose 70 - 99 mg/dL 156(H) 122(H) -  BUN 6 - 23 mg/dL 23 22 -  Creatinine 0.50 - 1.10 mg/dL 0.75 0.76 0.80  Sodium 135 - 145 mmol/L 142 143 -  Potassium 3.5 - 5.1 mmol/L 3.8 3.8 -  Chloride 96 - 112 mmol/L 101 105 -  CO2 19 - 32 mmol/L 29 27 -  Calcium 8.4 - 10.5 mg/dL 9.4 9.2 -  Total Protein 6.0 - 8.3 g/dL - 6.8 -  Total Bilirubin 0.3 - 1.2 mg/dL - 0.8 -  Alkaline Phos 39 - 117 U/L - 167(H) -  AST 0 - 37 U/L - 47(H) -  ALT 0 - 35 U/L - 65(H) -      Lab Results  Component Value Date   WBC 13.7* 03/14/2015   HGB 12.4 03/14/2015   HCT 38.0 03/14/2015   MCV 87.2 03/14/2015   PLT 229 03/14/2015   NEUTROABS 7.9* 03/12/2015    ASSESSMENT AND PLAN:  1. Metastatic carcinoma: I reviewed the CT chest abdomen and pelvis result which showed a 5.8 cm tumor in the left suprahilar region distorting the left main pulmonary artery this is associated with constriction of left  upper lobe bronchus with collapse of left upper lobe., In addition widespread liver metastases was identified with over 30 lesions within the liver measuring maximum 3 cm and 2.5 cm and right and left liver lobes. There was also bulky periportal lymphadenopathy as well as pancreatic mass and intrarenal metastases in addition to widespread bone metastases  2. Discussion: After yesterday's discussion with the patient regarding different treatment options for metastatic lung cancer or metastatic breast cancer, patient had made up her mind that she does not want to be treated with chemotherapy and does not want to take medications to prolong her life. She wanted to keep her comfortable and pursue with comfort care measures and end-of-life care with hospice.  Recommendation: 1. Since it is no plan for systemic chemotherapy, we decided to not pursue the liver or lung biopsy. I will cancel that order. 2. Please consult hospice care to assist with end-of-life care. 3. Patient may benefit from radiation therapy for palliation. She will discuss with Dr. Lisbeth Renshaw and make up her final decision. I recommended that might help with pain and prevent immediate neurological event.  Patient understands fully that she has very short life span measured in weeks to months. I called her Quentin Angst and discussed with him the results and her decisions at patient's request. Patient  would most likely go to a nursing home hospice for end-of-life care after radiation is complete.

## 2015-03-15 ENCOUNTER — Ambulatory Visit
Admit: 2015-03-15 | Discharge: 2015-03-15 | Disposition: A | Discharge status: Home / Self Care | Payer: Medicare Other | Attending: Radiation Oncology | Admitting: Radiation Oncology

## 2015-03-15 ENCOUNTER — Encounter: Payer: Self-pay | Admitting: *Deleted

## 2015-03-15 ENCOUNTER — Telehealth: Payer: Self-pay | Admitting: *Deleted

## 2015-03-15 ENCOUNTER — Ambulatory Visit
Admit: 2015-03-15 | Discharge: 2015-03-15 | Disposition: A | Discharge status: Home / Self Care | Payer: Medicare Other | Source: Ambulatory Visit | Attending: Radiation Oncology | Admitting: Radiation Oncology

## 2015-03-15 DIAGNOSIS — C7951 Secondary malignant neoplasm of bone: Secondary | ICD-10-CM | POA: Insufficient documentation

## 2015-03-15 DIAGNOSIS — C50412 Malignant neoplasm of upper-outer quadrant of left female breast: Secondary | ICD-10-CM | POA: Insufficient documentation

## 2015-03-15 DIAGNOSIS — C3491 Malignant neoplasm of unspecified part of right bronchus or lung: Secondary | ICD-10-CM

## 2015-03-15 DIAGNOSIS — G893 Neoplasm related pain (acute) (chronic): Secondary | ICD-10-CM

## 2015-03-15 MED ORDER — SALINE SPRAY 0.65 % NA SOLN
1.0000 | NASAL | Status: DC | PRN
  Administered 2015-03-15: 1 via NASAL
  Filled 2015-03-15: qty 44

## 2015-03-15 NOTE — [facility] Oncology Navigator Note (Signed)
Patient admitted to [facility] Oncology Department on Saturday for management of back pain.  I visited patient who was in good spirits.  She had just completed a session with the physical therapist and had walked in the hallway with assistance.  We discussed the plans for radiation therapy and discharge to a skilled nursing facility.  Patient prefers [facility].  She reports that she is adjusting to her diagnosis and prognosis and that her pain is being managed well.  She asked that I make calls to cancel her upcoming appointments.  I sent pof to cancel appointment with Dr. Burr Medico and notified Dr. Aurea Graff office.  I encouraged her to call me when needed.

## 2015-03-15 NOTE — Progress Notes (Signed)
Clinical Social Work Department BRIEF PSYCHOSOCIAL ASSESSMENT 03/15/2015  Patient:  Alexandra Snyder, Alexandra Snyder     Account Number:  [id number]     Admit date:  03/12/2015  Clinical Social Worker:  Maryln Manuel  Date/Time:  03/15/2015 11:30 AM  Referred by:  Physician  Date Referred:  03/15/2015 Referred for  SNF Placement   Other Referral:   Interview type:  Patient Other interview type:    PSYCHOSOCIAL DATA Living Status:  ALONE Admitted from facility:   Level of care:   Primary support name:  Pamala Hurry [phone] sister-in-law Primary support relationship to patient:  FAMILY Degree of support available:   adequate    CURRENT CONCERNS Current Concerns  Post-Acute Placement   Other Concerns:    SOCIAL WORK ASSESSMENT / PLAN CSW received referral for New SNF.    CSW met with pt at bedside. CSW introduced self and explained role. Pt discussed that she received simulation for radiation this morning. Pt shared that simulation went fine besides the "hard cold table". CSW explored with pt prior living situation. Pt shared that she lived alone prior to admission. Pt openly discussed her diagnosis and prognosis and states that she does not expect to every return home because she recogonizes that she needs care. CSW discussed recommendation for SNF. Pt aware of recommendation and states that she is hopeful for [facility]. CSW discussed about palliative care following at SNF and discussed with pt insurance coverage and that insurance will only cover while pt participating in PT and making progress with PT. Pt expressed understanding and expressed that she anticipates that she will become less funcitional as her disease progresses and eventually anticipates that she will need residential hospice placement. Support provided.    CSW completed FL2 and initiated SNF search to [facility]. CSW contacted facility and spoke with admissions coordinator to notify of pt interest in the facility.    CSW  received return phone call from [facility] who stated that they can offer pt a bed as long as pt willing to pay privately for transportation to radiation treatment.    CSW met with pt at bedside to notify pt of bed offer, but pt would have to pay privately for transportation to radiation treatments. Pt stated that radiation oncologist said that she would remain in the hospital for treatments. CSW discussed that unfortunately radiation does not meet acuity for extended hospitalization, but [facility] could transport pt to and from radiation treatments. Pt expressed understanding and discussed that she is agreeable to paying privately for transportation as she does not have family that can provide the transportation for her.    CSW notified [facility] who confirmed that they can accept pt tomorrow.    CSW updated MD.    CSW to continue to follow to provide support and assist with pt disposition to [facility]   Assessment/plan status:  Psychosocial Support/Ongoing Assessment of Needs Other assessment/ plan:   discharge planning   Information/referral to community resources:   Referral to [facility] per MD request.    PATIENT'S/FAMILY'S RESPONSE TO PLAN OF CARE: Pt alert and oriented x 4. Pt pleasant and actively engaged in conversation. Pt displays good understanding of her illness and prognosis. Pt is relieved to know that [facility] can accept her, but aware that decisions will have to be made along the way especially if pt plateaus with therapy or becomes unable to participate in therapy and pt insurance no longer covers SNF. Pt aware that palliative care will  follow at SNF to have continued discussions about goals of care and plans when pt begins to further decline. Emotional support and counseling provided.   Alison Murray, MSW, [facility] [phone]

## 2015-03-15 NOTE — Progress Notes (Signed)
[facility] PROGRESS NOTE  Alexandra Snyder RCV:[id number] DOB: [birth year] DOA: 03/12/2015 PCP: No primary care provider on file.  Interim Summary Patient is a pleasant 81 year old female with a past medical history of placental breast cancer, history of COPD, who was admitted to the medicine service on 03/12/2015 presenting with complaints of bilateral lower extremity weakness and back pain. She had an MRI of lumbar spine which revealed diffuse metastatic disease from T9 to L5, with a T10 pathologic fracture without neural impingement. She was seen and evaluated by Dr. Lisbeth Renshaw of radiation oncology and Dr.Gudena of medical oncology. She was started on 4 mg IV every 12 hours. She was further worked up with a CT scan of chest abdomen and pelvis which unfortunately revealed large left suprahilar mass measuring 5.2 x 5.8 x 5.2 cm, per radiology. 3 most consistent with primary bronchogenic carcinoma. She was also noted have widespread metastatic disease involving liver, pancreas, adrenal gland, widespread skeletal metastasis. MRI of cervical and thoracic spine revealed widespread spinal metastatic disease along with cerebellar metastasis. Given advanced age, patient expressed desire to not undergo chemotherapy. Dr. Deitra Mayo of palliative care was consulted. Plan for palliative radiation therapy.   Assessment/Plan: 1. Metastatic cancer.  -Patient with history of breast cancer T1b N0 M0 stage IA status post lumpectomy and radiation therapy who had been on anastrozole therapy. -She presents with complaints of back pain associate with bilateral weakness, as MRI of the lumbar spine showing diffuse metastatic disease involving T9-L5. Radiology also reported a T10 pathologic fracture.  -Dr. Lisbeth Renshaw of radiation oncology and Dr. Lindi Adie of medical oncology consulted -CT scan of chest, abdomen and pelvis revealed large left suprahilar mass measuring 5.2 x 5.8 x 5.2 cm, per radiology appearing to be most consistent with  primary bronchogenic carcinoma, with widespread metastatic disease involving liver, pancreas and adrenal gland, widespread skeletal metastasis. Patient expressing wishes to focus her care on comfort rather than undergoing potentially burdensome/invasive procedures if there would be no change in end point.  -Palliative care consulted. Plan for palliative radiation therapy  2.  Chronic obstructive point disease -Stable from a respiratory standpoint  3.  Pain management. -Patient reporting improvement to pain with the administration of IV steroids and tramadol.  -Palliative care consulted for assist with pain management  4. Goals of Care -She expressed wishes to not undergo further chemotherapy, rather focus her care on comfort and quality of life. Palliative Care consulted. She is a DO NOT RESUSCITATE. She would like to transition to skilled nursing facility. Case discussed with social work as a physical therapy consultation has been placed. Anticipate discharge to skilled nursing facility in the next 24-48 hours.  Code Status: DO NOT RESUSCITATE Family Communication:  Disposition Plan: Patient wishing to transition to skilled nursing facility with palliative care following. Physical therapy consulted. Anticipate discharge to skilled facility in the next 24-48 hours   Consultants:  Radiation oncology  Medical oncology   HPI/Subjective: Patient reports ongoing back pain overall though patient might be doing a little better. She is tolerating by mouth intake. There were no events overnight.  Objective: Filed Vitals:   03/15/15 0512  BP: 157/93  Pulse: 77  Temp: 98 F (36.7 C)  Resp: 16    Intake/Output Summary (Last 24 hours) at 03/15/15 0654 Last data filed at 03/15/15 0518  Gross per 24 hour  Intake    240 ml  Output    900 ml  Net   -660 ml   Filed Weights   03/12/15  2015  Weight: 55.4 kg (122 lb 2.2 oz)    Exam:   General:  Patient reports difficulty sleeping at  night, has ongoing back pain  Cardiovascular: Regular rate and rhythm normal S1-S2 no murmurs rubs or gallops  Respiratory: Normal respiratory effort, lungs are clear to auscultation bilaterally  Abdomen: Soft nontender nondistended  Musculoskeletal: No edema  Neurological: Patient reporting improvement to bilateral lower extremity achiness, she has 4-5 muscle strength to b/l legs  Data Reviewed: Basic Metabolic Panel:  Recent Labs Lab 03/12/15 1343 03/12/15 1435 03/13/15 0440 03/14/15 0430  NA 141  --  143 142  K 2.9*  --  3.8 3.8  CL 103  --  105 101  CO2  --   --  27 29  GLUCOSE 98  --  122* 156*  BUN 21  --  22 23  CREATININE 0.80 0.80 0.76 0.75  CALCIUM  --   --  9.2 9.4  MG  --   --  1.8  --    Liver Function Tests:  Recent Labs Lab 03/13/15 0440  AST 47*  ALT 65*  ALKPHOS 167*  BILITOT 0.8  PROT 6.8  ALBUMIN 3.4*   No results for input(s): LIPASE, AMYLASE in the last 168 hours. No results for input(s): AMMONIA in the last 168 hours. CBC:  Recent Labs Lab 03/12/15 1343 03/12/15 1426 03/13/15 0440 03/14/15 0430  WBC  --  11.1* 9.6 13.7*  NEUTROABS  --  7.9*  --   --   HGB 12.9 11.3* 11.4* 12.4  HCT 38.0 34.4* 34.8* 38.0  MCV  --  86.4 86.6 87.2  PLT  --  171 197 229   Cardiac Enzymes: No results for input(s): CKTOTAL, CKMB, CKMBINDEX, TROPONINI in the last 168 hours. BNP (last 3 results) No results for input(s): BNP in the last 8760 hours.  ProBNP (last 3 results) No results for input(s): PROBNP in the last 8760 hours.  CBG: No results for input(s): GLUCAP in the last 168 hours.  Recent Results (from the past 240 hour(s))  MRSA PCR Screening     Status: None   Collection Time: 03/13/15  5:08 AM  Result Value Ref Range Status   MRSA by PCR NEGATIVE NEGATIVE Final    Comment:        The GeneXpert MRSA Assay (FDA approved for NASAL specimens only), is one component of a comprehensive MRSA colonization surveillance program. It is  not intended to diagnose MRSA infection nor to guide or monitor treatment for MRSA infections.      Studies: Ct Chest W Contrast  03/13/2015   CLINICAL DATA:  81 year old female with a history of COPD, iron deficiency anemia, and left-sided breast cancer presents with one-week history of worsening back pain and lower extremity weakness. The patient states that she has had back pain "for years". She has had some injections in her back in January and February 2016 which have given her some temporary relief. However in the past week her pain has progressed to the point where she is having bilateral leg weakness and having much difficulty ambulating secondary to the pain. She denies any bowel or bladder incontinence. Because of her pain, she came to the emergency department. MRI of the lumbar spine revealed diffuse metastatic disease from T9 vertebra to L5. There is also a T10 pathologic fracture without neural impingement. In addition, there was extensive tumor in the spinal canal causing moderate to severe degree of thecal sac compression at L3. There  was also liver and adrenal metastasis.  EXAM: CT CHEST, ABDOMEN, AND PELVIS WITH CONTRAST  TECHNIQUE: Multidetector CT imaging of the chest, abdomen and pelvis was performed following the standard protocol during bolus administration of intravenous contrast.  CONTRAST:  32mL OMNIPAQUE IOHEXOL 300 MG/ML SOLN, 179mL OMNIPAQUE IOHEXOL 300 MG/ML SOLN  COMPARISON:  MRI cervical spine 03/13/2015, MRI lumbar spine 03/12/2015  FINDINGS: CT CHEST FINDINGS  Mediastinum/Nodes: There is a left suprahilar mass surrounding and distorting the left main pulmonary artery. This mass measures approximately 5.2 x 5.8 x 5.2 cm. The mass extends into the AP window and central mediastinum to the level of the mid trachea. The mass surrounds and constricts the left upper lobe bronchus. There is collapse of the left upper lobe.  There is no additional discrete mediastinal adenopathy.  Prevascular node measures 9 mm is likely metastatic  The left lobe of thyroid gland is lobular extends into the mediastinum.  Lungs/Pleura: Left upper lobe collapse related to the left supra hilar mass.  CT ABDOMEN AND PELVIS FINDINGS  Hepatobiliary: Widespread hepatic metastasis with round low-density enhancing lesions. There approximately 30 lesions within the liver. Example lesion in the left hepatic lobe measures 2.5 cm. Example lesion in the right hepatic lobe measures 3.0 cm (image 57, series 2)  There is bulky periportal metastatic necrotic lymphadenopathy.  Pancreas: There is alesion in the pancreatic head measuring 19 mm (image 66, series 2). No pancreatic duct dilatation dilatation.  Spleen: Normal spleen  Adrenals/Urinary Tract: Right adrenal gland metastasis measures 2.2 x 3.1 cm. Low-density lesions of the left renal cortex. No renal obstruction. Bladder is difficult to evaluate due to bilateral hip prosthetics.  Stomach/Bowel: The stomach, small bowel, appendix, and colon are normal.  Vascular/Lymphatic: The aorta normal caliber. There is no retroperitoneal adenopathy. Extensive periportal lymphadenopathy.  Reproductive: Uterus is small. Difficult to evaluate the pelvis due to streak artifact from the hip prosthetics. No clear pelvic adenopathy.  Other: No peritoneal carcinomatosis.  Musculoskeletal: Widespread skeletal metastasis with multiple lytic and sclerotic lesions within the iliac bones, sacrum, and throughout the spine. Epidural tumor at L 3 is much better depicted on MRI.  IMPRESSION: Chest Impression:  1. Large left suprahilar mass constricting the left main pulmonary artery and left upper lobe bronchus and extending into the mediastinum is most consistent with primary bronchogenic carcinoma. 2. Postobstructive collapse of the left upper lobe.  Abdomen / Pelvis Impression:  1. Widespread hepatic metastasis. 2. Multiple enlarged necrotic periportal metastatic lymph nodes. 3. Right adrenal  gland metastasis. 4. Lesion within the head of the pancreas. Favor pancreatic metastasis. 5. Widespread skeletal metastasis throughout the spine and pelvis. 6. 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   Electronically Signed   By: Suzy Bouchard M.D.   On: 03/13/2015 18:26   Mr Cervical Spine W Wo Contrast  03/13/2015   CLINICAL DATA:  Breast cancer with new lower extremity weakness. Initial encounter.  EXAM: MRI CERVICAL AND THORACIC SPINE WITHOUT AND WITH CONTRAST  TECHNIQUE: Multiplanar and multiecho pulse sequences of the cervical spine, to include the craniocervical junction and cervicothoracic junction, and lumbar spine, were obtained without and with intravenous contrast.  CONTRAST:  66mL MULTIHANCE GADOBENATE DIMEGLUMINE 529 MG/ML IV SOLN  COMPARISON:  MRI LUMBAR SPINE REPORTED SEPARATELY 03/12/2015.  FINDINGS: MRI CERVICAL SPINE FINDINGS  The patient was unable to remain motionless for the exam. Small or subtle lesions could be overlooked.  Metastatic lesions are seen throughout all  cervical vertebrae. There is complete replacement at the C6 and C7 vertebral bodies, at risk for pathologic fracture. No epidural tumor. Minor cervical disc disease. Craniocervical junction unremarkable.  Limited visualization of the intracranial compartment shows multiple cerebellar metastases which appear intra-axial. There may be subarachnoid spread of tumor along the dorsal aspect of the cerebellum. There appears to be pachymeningeal thickening of the dura ventral to the pons. The clivus is abnormal with tumor replacement. Suspected calvarial metastases. MRI brain imaging is recommended for further evaluation.  MRI THORACIC SPINE FINDINGS  Numbering of the thoracic spine was confirmed by counting down from the odontoid.  Widespread metastatic disease is seen throughout all thoracic vertebrae. There is early pathologic compression of the T10 vertebral body. There is  minimal posterior protrusion of tumor into the ventral epidural space but no significant cord compression. No epidural tumor at any location in the thoracic spine. No thoracic disc protrusion.  Redemonstrated is an intramedullary metastasis opposite the T12 vertebral body. This was better visualized on prior MR from yesterday. Subarachnoid spread of tumor over the dorsal conus was better visualized also on yesterday's exam. There are subtle areas on postcontrast imaging which suggests subarachnoid spread of tumor may be present over the mid to upper thoracic region, but unfortunately cannot be confirmed on postcontrast axial images. Areas of concern for subarachnoid coating of tumor along the cord are best seen on image 8 series 16 postcontrast sagittal.  No paravertebral masses. LEFT upper lobe atelectatic change is observed,, with LEFT hilar mass, incompletely evaluated. Widespread liver metastases. Suspected BILATERAL adrenal metastases. CT chest abdomen pelvis will be reported separately.  IMPRESSION: Widespread spinal metastatic disease without cervical or thoracic cord compression.  Intramedullary metastasis at T12 with suspected thoracic subarachnoid spread of tumor.  Pathologic compression fracture T10 with minimal retropulsion but no cord compression.  No significant epidural tumor is seen in the cervical or thoracic spinal canal.  Cerebellar metastases with clival, suspected calvarial, and possible dural invasion. MRI brain without and with contrast recommended.  Significant disease in the chest and abdomen is suspected. This will be better assessed on CT imaging.   Electronically Signed   By: Rolla Flatten M.D.   On: 03/13/2015 13:33   Mr Thoracic Spine W Wo Contrast  03/13/2015   CLINICAL DATA:  Breast cancer with new lower extremity weakness. Initial encounter.  EXAM: MRI CERVICAL AND THORACIC SPINE WITHOUT AND WITH CONTRAST  TECHNIQUE: Multiplanar and multiecho pulse sequences of the cervical spine,  to include the craniocervical junction and cervicothoracic junction, and lumbar spine, were obtained without and with intravenous contrast.  CONTRAST:  54mL MULTIHANCE GADOBENATE DIMEGLUMINE 529 MG/ML IV SOLN  COMPARISON:  MRI LUMBAR SPINE REPORTED SEPARATELY 03/12/2015.  FINDINGS: MRI CERVICAL SPINE FINDINGS  The patient was unable to remain motionless for the exam. Small or subtle lesions could be overlooked.  Metastatic lesions are seen throughout all cervical vertebrae. There is complete replacement at the C6 and C7 vertebral bodies, at risk for pathologic fracture. No epidural tumor. Minor cervical disc disease. Craniocervical junction unremarkable.  Limited visualization of the intracranial compartment shows multiple cerebellar metastases which appear intra-axial. There may be subarachnoid spread of tumor along the dorsal aspect of the cerebellum. There appears to be pachymeningeal thickening of the dura ventral to the pons. The clivus is abnormal with tumor replacement. Suspected calvarial metastases. MRI brain imaging is recommended for further evaluation.  MRI THORACIC SPINE FINDINGS  Numbering of the thoracic spine was confirmed by counting down from  the odontoid.  Widespread metastatic disease is seen throughout all thoracic vertebrae. There is early pathologic compression of the T10 vertebral body. There is minimal posterior protrusion of tumor into the ventral epidural space but no significant cord compression. No epidural tumor at any location in the thoracic spine. No thoracic disc protrusion.  Redemonstrated is an intramedullary metastasis opposite the T12 vertebral body. This was better visualized on prior MR from yesterday. Subarachnoid spread of tumor over the dorsal conus was better visualized also on yesterday's exam. There are subtle areas on postcontrast imaging which suggests subarachnoid spread of tumor may be present over the mid to upper thoracic region, but unfortunately cannot be  confirmed on postcontrast axial images. Areas of concern for subarachnoid coating of tumor along the cord are best seen on image 8 series 16 postcontrast sagittal.  No paravertebral masses. LEFT upper lobe atelectatic change is observed,, with LEFT hilar mass, incompletely evaluated. Widespread liver metastases. Suspected BILATERAL adrenal metastases. CT chest abdomen pelvis will be reported separately.  IMPRESSION: Widespread spinal metastatic disease without cervical or thoracic cord compression.  Intramedullary metastasis at T12 with suspected thoracic subarachnoid spread of tumor.  Pathologic compression fracture T10 with minimal retropulsion but no cord compression.  No significant epidural tumor is seen in the cervical or thoracic spinal canal.  Cerebellar metastases with clival, suspected calvarial, and possible dural invasion. MRI brain without and with contrast recommended.  Significant disease in the chest and abdomen is suspected. This will be better assessed on CT imaging.   Electronically Signed   By: Rolla Flatten M.D.   On: 03/13/2015 13:33   Ct Abdomen Pelvis W Contrast  03/13/2015   CLINICAL DATA:  81 year old female with a history of COPD, iron deficiency anemia, and left-sided breast cancer presents with one-week history of worsening back pain and lower extremity weakness. The patient states that she has had back pain "for years". She has had some injections in her back in January and February 2016 which have given her some temporary relief. However in the past week her pain has progressed to the point where she is having bilateral leg weakness and having much difficulty ambulating secondary to the pain. She denies any bowel or bladder incontinence. Because of her pain, she came to the emergency department. MRI of the lumbar spine revealed diffuse metastatic disease from T9 vertebra to L5. There is also a T10 pathologic fracture without neural impingement. In addition, there was extensive tumor  in the spinal canal causing moderate to severe degree of thecal sac compression at L3. There was also liver and adrenal metastasis.  EXAM: CT CHEST, ABDOMEN, AND PELVIS WITH CONTRAST  TECHNIQUE: Multidetector CT imaging of the chest, abdomen and pelvis was performed following the standard protocol during bolus administration of intravenous contrast.  CONTRAST:  16mL OMNIPAQUE IOHEXOL 300 MG/ML SOLN, 119mL OMNIPAQUE IOHEXOL 300 MG/ML SOLN  COMPARISON:  MRI cervical spine 03/13/2015, MRI lumbar spine 03/12/2015  FINDINGS: CT CHEST FINDINGS  Mediastinum/Nodes: There is a left suprahilar mass surrounding and distorting the left main pulmonary artery. This mass measures approximately 5.2 x 5.8 x 5.2 cm. The mass extends into the AP window and central mediastinum to the level of the mid trachea. The mass surrounds and constricts the left upper lobe bronchus. There is collapse of the left upper lobe.  There is no additional discrete mediastinal adenopathy. Prevascular node measures 9 mm is likely metastatic  The left lobe of thyroid gland is lobular extends into the mediastinum.  Lungs/Pleura:  Left upper lobe collapse related to the left supra hilar mass.  CT ABDOMEN AND PELVIS FINDINGS  Hepatobiliary: Widespread hepatic metastasis with round low-density enhancing lesions. There approximately 30 lesions within the liver. Example lesion in the left hepatic lobe measures 2.5 cm. Example lesion in the right hepatic lobe measures 3.0 cm (image 57, series 2)  There is bulky periportal metastatic necrotic lymphadenopathy.  Pancreas: There is alesion in the pancreatic head measuring 19 mm (image 66, series 2). No pancreatic duct dilatation dilatation.  Spleen: Normal spleen  Adrenals/Urinary Tract: Right adrenal gland metastasis measures 2.2 x 3.1 cm. Low-density lesions of the left renal cortex. No renal obstruction. Bladder is difficult to evaluate due to bilateral hip prosthetics.  Stomach/Bowel: The stomach, small bowel,  appendix, and colon are normal.  Vascular/Lymphatic: The aorta normal caliber. There is no retroperitoneal adenopathy. Extensive periportal lymphadenopathy.  Reproductive: Uterus is small. Difficult to evaluate the pelvis due to streak artifact from the hip prosthetics. No clear pelvic adenopathy.  Other: No peritoneal carcinomatosis.  Musculoskeletal: Widespread skeletal metastasis with multiple lytic and sclerotic lesions within the iliac bones, sacrum, and throughout the spine. Epidural tumor at L 3 is much better depicted on MRI.  IMPRESSION: Chest Impression:  1. Large left suprahilar mass constricting the left main pulmonary artery and left upper lobe bronchus and extending into the mediastinum is most consistent with primary bronchogenic carcinoma. 2. Postobstructive collapse of the left upper lobe.  Abdomen / Pelvis Impression:  1. Widespread hepatic metastasis. 2. Multiple enlarged necrotic periportal metastatic lymph nodes. 3. Right adrenal gland metastasis. 4. Lesion within the head of the pancreas. Favor pancreatic metastasis. 5. Widespread skeletal metastasis throughout the spine and pelvis. 6. 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   Electronically Signed   By: Suzy Bouchard M.D.   On: 03/13/2015 18:26    Scheduled Meds: . amLODipine  10 mg Oral Daily  . anastrozole  1 mg Oral Daily  . dexamethasone  4 mg Intravenous Q12H  . pantoprazole  40 mg Oral Daily   Continuous Infusions:   Active Problems:   Anemia, iron deficiency   Leg weakness   Metastatic cancer to spine   COPD with emphysema   Hypokalemia   Metastasis from malignant tumor of breast   Metastatic lung cancer (metastasis from lung to other site)   Palliative care encounter   Cancer related pain   Pruritus    Time spent: 35 minutes   Kelvin Cellar  [facility] Pager [phone]. If 7PM-7AM, please contact night-coverage at [email or web address], password  [facility] 03/15/2015, 6:54 AM  LOS: 3 days

## 2015-03-15 NOTE — Progress Notes (Signed)
Patient JK:KXFGH Alexandra Snyder      DOB: 05-Jun-1934      WEX:[id number]   Palliative Medicine Team at [facility] Progress Note    Subjective: Pain well controlled. Appetite good. No n/v.  Tolerated radiation well.     Filed Vitals:   03/15/15 1407  BP: 178/93  Pulse: 74  Temp: 98 F (36.7 C)  Resp: 16   Physical exam: GEN: alert, NAD HEENT: [place], sclera anicteric CV: RRR ABD; soft, NT EXT: warm  CBC    Component Value Date/Time   WBC 13.7* 03/14/2015 0430   WBC 7.6 10/28/2013 0946   RBC 4.36 03/14/2015 0430   RBC 2.82* 11/21/2013 1113   RBC 4.11 10/28/2013 0946   HGB 12.4 03/14/2015 0430   HGB 11.9 10/28/2013 0946   HCT 38.0 03/14/2015 0430   HCT 36.4 10/28/2013 0946   PLT 229 03/14/2015 0430   PLT 223 10/28/2013 0946   MCV 87.2 03/14/2015 0430   MCV 88.6 10/28/2013 0946   MCH 28.4 03/14/2015 0430   MCH 28.8 10/28/2013 0946   MCHC 32.6 03/14/2015 0430   MCHC 32.5 10/28/2013 0946   RDW 16.3* 03/14/2015 0430   RDW 14.9* 10/28/2013 0946   LYMPHSABS 1.8 03/12/2015 1426   LYMPHSABS 1.6 10/28/2013 0946   MONOABS 1.4* 03/12/2015 1426   MONOABS 1.0* 10/28/2013 0946   EOSABS 0.0 03/12/2015 1426   EOSABS 0.1 10/28/2013 0946   BASOSABS 0.0 03/12/2015 1426   BASOSABS 0.0 10/28/2013 0946    CMP     Component Value Date/Time   NA 142 03/14/2015 0430   NA 142 10/28/2013 0946   K 3.8 03/14/2015 0430   K 4.2 10/28/2013 0946   CL 101 03/14/2015 0430   CL 103 05/06/2013 0923   CO2 29 03/14/2015 0430   CO2 24 10/28/2013 0946   GLUCOSE 156* 03/14/2015 0430   GLUCOSE 90 10/28/2013 0946   GLUCOSE 92 05/06/2013 0923   BUN 23 03/14/2015 0430   BUN 19.3 10/28/2013 0946   CREATININE 0.75 03/14/2015 0430   CREATININE 0.8 10/28/2013 0946   CALCIUM 9.4 03/14/2015 0430   CALCIUM 10.4 10/28/2013 0946   PROT 6.8 03/13/2015 0440   PROT 8.0 10/28/2013 0946   ALBUMIN 3.4* 03/13/2015 0440   ALBUMIN 3.9 10/28/2013 0946   AST 47* 03/13/2015 0440   AST 16 10/28/2013 0946   ALT  65* 03/13/2015 0440   ALT 11 10/28/2013 0946   ALKPHOS 167* 03/13/2015 0440   ALKPHOS 71 10/28/2013 0946   BILITOT 0.8 03/13/2015 0440   BILITOT 0.36 10/28/2013 0946   GFRNONAA 78* 03/14/2015 0430   GFRAA >90 03/14/2015 0430       Assessment and plan: 81 yo female with PMHx of Breast CA admitted with diffuse bony mets liver/pancreatic/adrenal mets with likely lung primary  1. Code Status: DNR  2.GOC: See initial consult. Working on d./c to SNF with palliative and hopefully engaging hospice after completion of radiation.   3. Symptom Management:  1. Cancer Related Pain/Spine Mets: controlled with tramadol. Can convert steroids to oral at discharge.  2. Pruritis: d/c hydrocodone. PRN benadryl 3. GI PPx: will add protonix with steroids that she will be on for some time.   4. Psychosocial/Spiritual: Talked to her pastor last night. Has a step son in [place] Alexandra Snyder) and another in [place] Alexandra Snyder). 2 brothers her in [place] and [place]). Lives in [place] alone and planning on long-term care. Formerly worked for [employer]. Widowed, her husband passed away 6 years  ago.   Doran Clay D.O. Palliative Medicine Team at [facility]  Pager: [phone] Team Phone: [phone]

## 2015-03-15 NOTE — Telephone Encounter (Signed)
Called and spoke with RN Deanna, asked if patient could come down at 9am for CT simulation lasting about 1 hour, asked if patient alert,oriented and any Oxygen or IvF;s ongoing, per RN, patient has no IVF's or oxygen can come down via w/c, asked if patient needed pain med  For 1 hour on lying  hard table, per RN patient declined any pain meds,thanked Deanna,RN and gave status to [place], RT therapit in [facility] 8:32 AM

## 2015-03-15 NOTE — Evaluation (Addendum)
Physical Therapy Evaluation Patient Details Name: Alexandra Snyder MRN: [id number] DOB: 16-Sep-1934 Today's Date: 03/15/2015   History of Present Illness  81 y.o. female with h/o COPD, L THA anterior approach 2014, R femur IM nail 2014, admitted with BLE weakness and back pain. MRI of the lumbar spine revealed diffuse metastatic disease from T9 vertebra to L5. There is also a T10 pathologic fracture without neural impingement. In addition, there was extensive tumor in the spinal canal causing moderate to severe degree of thecal sac compression at L3. There was also liver and adrenal metastasis.   Clinical Impression  Pt admitted with above diagnosis. Pt currently with functional limitations due to the deficits listed below (see PT Problem List). Pt ambulated 57' with RW and min/guard assist, HR 129 with walking, SaO2 94% on RA walking.  Instructed pt in BLE strengthening exercises. She fatigues quickly and would benefit from ST-SNF.  Pt will benefit from skilled PT to increase their independence and safety with mobility to allow discharge to the venue listed below.   *    Follow Up Recommendations SNF    Equipment Recommendations  None recommended by PT    Recommendations for Other Services OT consult     Precautions / Restrictions Precautions Precautions: Fall Restrictions Weight Bearing Restrictions: No      Mobility  Bed Mobility Overal bed mobility: Needs Assistance Bed Mobility: Supine to Sit;Rolling Rolling: Min assist   Supine to sit: Min assist     General bed mobility comments: instructed pt in log rolling, min A to raise trunk  Transfers Overall transfer level: Needs assistance Equipment used: Rolling walker (2 wheeled) Transfers: Sit to/from Stand Sit to Stand: Min assist         General transfer comment: min A to rise  Ambulation/Gait Ambulation/Gait assistance: Min guard Ambulation Distance (Feet): 90 Feet Assistive device: Rolling walker (2 wheeled) Gait  Pattern/deviations: Step-through pattern;Decreased stride length;Trunk flexed   Gait velocity interpretation: Below normal speed for age/gender General Gait Details: pt steady with RW, at baseline she uses SPC however reported she feels more steady with RW; HR 129 with walking, SaO2 94% on RA, 2/4 dyspnea   Stairs            Wheelchair Mobility    Modified Rankin (Stroke Patients Only)       Balance Overall balance assessment: Modified Independent (most recent fall was 2 years ago per pt)                                           Pertinent Vitals/Pain Pain Assessment: 0-10 Pain Score: 6  Pain Location: L upper buttock with walking Pain Descriptors / Indicators: Sore Pain Intervention(s): Premedicated before session;Monitored during session;Limited activity within patient's tolerance    Home Living Family/patient expects to be discharged to:: Skilled nursing facility Living Arrangements: Alone     Home Access: Ramped entrance     Home Layout: One level Home Equipment: Walker - 2 wheels;Shower seat;Bedside commode;Cane - single point      Prior Function Level of Independence: Independent with assistive device(s)         Comments: walked with SPC, was independent with ADLs     Hand Dominance   Dominant Hand: Right    Extremity/Trunk Assessment   Upper Extremity Assessment: LUE deficits/detail       LUE Deficits / Details: pt reports L  rotator cuff problems, she is unable to elevate L shoulder actively but has normal strength in L elbow and hand, PROM L shoulder WFL   Lower Extremity Assessment: Overall WFL for tasks assessed (4/5 BLEs)      Cervical / Trunk Assessment: Kyphotic  Communication   Communication: No difficulties  Cognition Arousal/Alertness: Awake/alert Behavior During Therapy: WFL for tasks assessed/performed Overall Cognitive Status: Within Functional Limits for tasks assessed                       General Comments      Exercises General Exercises - Lower Extremity Ankle Circles/Pumps: AROM;Both;10 reps;Supine Quad Sets: AROM;Both;10 reps;Supine Gluteal Sets: AROM;Both;5 reps;Supine      Assessment/Plan    PT Assessment Patient needs continued PT services  PT Diagnosis Generalized weakness;Difficulty walking   PT Problem List Decreased activity tolerance;Decreased mobility;Pain  PT Treatment Interventions Gait training;Functional mobility training;Therapeutic activities;Patient/family education;Therapeutic exercise   PT Goals (Current goals can be found in the Care Plan section) Acute Rehab PT Goals Patient Stated Goal: to get the strength back in her legs PT Goal Formulation: With patient Time For Goal Achievement: 03/29/15 Potential to Achieve Goals: Fair    Frequency Min 3X/week   Barriers to discharge Decreased caregiver support pt lives alone, wants to DC to SNF    Co-evaluation               End of Session Equipment Utilized During Treatment: Gait belt Activity Tolerance: Patient limited by fatigue Patient left: in bed;with call bell/phone within reach;with bed alarm set Nurse Communication: Mobility status         Time: 2224-1146 PT Time Calculation (min) (ACUTE ONLY): 22 min   Charges:   PT Evaluation $Initial PT Evaluation Tier I: 1 Procedure     PT G Codes:        Philomena Doheny 03/15/2015, 11:54 AM [phone]

## 2015-03-15 NOTE — Progress Notes (Addendum)
Clinical Social Work Department CLINICAL SOCIAL WORK PLACEMENT NOTE 03/15/2015  Patient:  Alexandra Snyder, Alexandra Snyder  Account Number:  [id number] Admit date:  03/12/2015  Clinical Social Worker:  Maryln Manuel  Date/time:  03/15/2015 12:00 N  Clinical Social Work is seeking post-discharge placement for this patient at the following level of care:   SKILLED NURSING   (*CSW will update this form in Epic as items are completed)   03/15/2015  Patient/family provided with [facility] Department of Clinical Social Work's list of facilities offering this level of care within the geographic area requested by the patient (or if unable, by the patient's family).  03/15/2015  Patient/family informed of their freedom to choose among providers that offer the needed level of care, that participate in Medicare, Medicaid or managed care program needed by the patient, have an available bed and are willing to accept the patient.  03/15/2015  Patient/family informed of [facility]' ownership interest in [facility], as well as of the fact that they are under no obligation to receive care at this facility.  PASARR submitted to EDS on 03/15/2015 PASARR number received on 03/15/2015  FL2 transmitted to all facilities in geographic area requested by pt/family on  03/15/2015 FL2 transmitted to all facilities within larger geographic area on   Patient informed that his/her managed care company has contracts with or will negotiate with  certain facilities, including the following:     Patient/family informed of bed offers received:  03/15/2015 Patient chooses bed at [facility], [place] Physician recommends and patient chooses bed at    Patient to be transferred to  on  [place], [place] Patient to be transferred to facility by ambulance Corey Harold) Patient and family notified of transfer on 03/16/2015 Name of family member notified:  Pt notified at bedside.  The  following physician request were entered in Epic:   Additional Comments:   Alison Murray, MSW, [place] Work [phone]

## 2015-03-16 ENCOUNTER — Encounter: Payer: Self-pay | Admitting: *Deleted

## 2015-03-16 ENCOUNTER — Ambulatory Visit
Admit: 2015-03-16 | Discharge: 2015-03-16 | Disposition: A | Discharge status: Home / Self Care | Payer: Medicare Other | Attending: Radiation Oncology | Admitting: Radiation Oncology

## 2015-03-16 ENCOUNTER — Telehealth: Payer: Self-pay | Admitting: *Deleted

## 2015-03-16 DIAGNOSIS — C7951 Secondary malignant neoplasm of bone: Secondary | ICD-10-CM

## 2015-03-16 MED ORDER — DEXAMETHASONE 4 MG PO TABS: 0.7500 mg | ORAL_TABLET | Freq: Two times a day (BID) | ORAL | Status: AC

## 2015-03-16 MED ORDER — PANTOPRAZOLE SODIUM 40 MG PO TBEC: 40.0000 mg | DELAYED_RELEASE_TABLET | Freq: Every day | ORAL | Status: AC

## 2015-03-16 MED ORDER — TRAMADOL HCL 50 MG PO TABS: 50.0000 mg | ORAL_TABLET | Freq: Four times a day (QID) | ORAL | Status: AC | PRN

## 2015-03-16 MED ORDER — AMLODIPINE BESYLATE 10 MG PO TABS: 10.0000 mg | ORAL_TABLET | Freq: Every day | ORAL | Status: DC

## 2015-03-16 NOTE — Telephone Encounter (Signed)
[place], spoke with RN Jasmine December, verified with her  that patient has transportation to [facility] for her radiation treatments starting tomorrow will have her here for her CT simulation at 1030am, thanked RN 4:17 PM

## 2015-03-16 NOTE — Progress Notes (Signed)
Pt for discharge to [facility].   CSW facilitated pt discharge needs including contacting facility, faxing pt discharge information via [facility], discussing with pt at bedside, providing RN phone number to call report, and arranging ambulance transportation for pt to [facility].   Pt coping appropriately with pt transition to [facility] for continued care. Pt pleased that she will be at [facility] as it is close to her sister-in-law who is a support to her.   No further social work needs identified at this time.  CSW signing off.   Alison Murray, MSW, [place] Work [phone]

## 2015-03-16 NOTE — Progress Notes (Signed)
   Department of Radiation Oncology  Phone:  [phone] Fax:        [phone]  Weekly Treatment Note    Name: Alexandra Snyder Date: 03/16/2015 MRN: [id number] DOB: [birth year]   Current dose: 6 Gy  Current fraction: 2   MEDICATIONS: No current facility-administered medications for this encounter.   No current outpatient prescriptions on file.   Facility-Administered Medications Ordered in Other Encounters  Medication Dose Route Frequency Provider Last Rate Last Dose  . acetaminophen (TYLENOL) tablet 650 mg  650 mg Oral Q6H PRN Orson Eva, MD   650 mg at 03/14/15 0488   Or  . acetaminophen (TYLENOL) suppository 650 mg  650 mg Rectal Q6H PRN Orson Eva, MD      . amLODipine (NORVASC) tablet 10 mg  10 mg Oral Daily Kelvin Cellar, MD   10 mg at 03/16/15 8916  . dexamethasone (DECADRON) injection 4 mg  4 mg Intravenous Q12H Orson Eva, MD   4 mg at 03/16/15 9450  . diphenhydrAMINE (BENADRYL) capsule 25 mg  25 mg Oral Q6H PRN Orson Eva, MD   25 mg at 03/15/15 0716  . methocarbamol (ROBAXIN) tablet 500 mg  500 mg Oral Q6H PRN Orson Eva, MD   500 mg at 03/14/15 0015  . morphine 2 MG/ML injection 2 mg  2 mg Intravenous Q4H PRN Orson Eva, MD      . ondansetron [facility]) tablet 4 mg  4 mg Oral Q6H PRN Orson Eva, MD       Or  . ondansetron (ZOFRAN) injection 4 mg  4 mg Intravenous Q6H PRN Orson Eva, MD      . pantoprazole (PROTONIX) EC tablet 40 mg  40 mg Oral Daily Darrel Reach [place], DO   40 mg at 03/16/15 3888  . sodium chloride (OCEAN) 0.65 % nasal spray 1 spray  1 spray Each Nare PRN Kelvin Cellar, MD   1 spray at 03/15/15 1730  . traMADol (ULTRAM) tablet 50 mg  50 mg Oral Q6H PRN Kelvin Cellar, MD   50 mg at 03/15/15 2146     ALLERGIES: Celebrex and Hydrocodone   LABORATORY DATA:  Lab Results  Component Value Date   WBC 13.7* 03/14/2015   HGB 12.4 03/14/2015   HCT 38.0 03/14/2015   MCV 87.2 03/14/2015   PLT 229 03/14/2015   Lab Results  Component Value Date   NA 142  03/14/2015   K 3.8 03/14/2015   CL 101 03/14/2015   CO2 29 03/14/2015   Lab Results  Component Value Date   ALT 65* 03/13/2015   AST 47* 03/13/2015   ALKPHOS 167* 03/13/2015   BILITOT 0.8 03/13/2015     NARRATIVE: Alexandra Snyder was seen today for weekly treatment management. The chart was checked and the patient's films were reviewed.  The patient states she has done fine with her treatment so far. No difficulties.  PHYSICAL EXAMINATION: Alert, no acute distress        ASSESSMENT: The patient is doing satisfactorily with treatment.  PLAN: We will continue with the patient's radiation treatment as planned.

## 2015-03-16 NOTE — Discharge Summary (Signed)
Physician Discharge Summary  SAVANHA ISLAND OXB:[id number] DOB: [birth year] DOA: 03/12/2015  PCP: No primary care provider on file.  Admit date: 03/12/2015 Discharge date: 03/16/2015  Time spent: 45 minutes  Recommendations for Outpatient Follow-up:  1. Adjust Decadron and Tramadol as needed for pain management 2. Palliative care consult at SNF  Discharge Condition: stable Diet recommendation: regular diet  Discharge Diagnoses:  Principal Problem:   Metastasis from malignant tumor of breast Active Problems:   Anemia, iron deficiency   Leg weakness   Metastatic cancer to spine   COPD with emphysema   Hypokalemia   Palliative care encounter   Cancer related pain   History of present illness:  Patient is a pleasant 81 year old female with a past medical history of placental breast cancer, history of COPD, who was admitted to the medicine service on 03/12/2015 presenting with complaints of bilateral lower extremity weakness and back pain. She had an MRI of lumbar spine which revealed diffuse metastatic disease from T9 to L5, with a T10 pathologic fracture without neural impingement. She was seen and evaluated by Dr. Lisbeth Renshaw of radiation oncology and Dr.Gudena of medical oncology. She was started on 4 mg IV every 12 hours. She was further worked up with a CT scan of chest abdomen and pelvis which unfortunately revealed large left suprahilar mass measuring 5.2 x 5.8 x 5.2 cm, per radiology. 3 most consistent with primary bronchogenic carcinoma. She was also noted have widespread metastatic disease involving liver, pancreas, adrenal gland, widespread skeletal metastasis. MRI of cervical and thoracic spine revealed widespread spinal metastatic disease along with cerebellar metastasis. Given advanced age, patient expressed desire to not undergo chemotherapy. Dr. Deitra Mayo of palliative care was consulted. Plan for palliative radiation therapy.   Hospital Course:   1.  Metastatic cancer. -Patient with  history of breast cancer T1b N0 M0 stage IA status post lumpectomy and radiation therapy who had been on anastrozole therapy. -She presents with complaints of back pain associate with bilateral weakness, as MRI of the lumbar spine showing diffuse metastatic disease involving T9-L5. Radiology also reported a T10 pathologic fracture.  -Dr. Lisbeth Renshaw of radiation oncology and Dr. Lindi Adie of medical oncology consulted -CT scan of chest, abdomen and pelvis revealed large left suprahilar mass measuring 5.2 x 5.8 x 5.2 cm, per radiology appearing to be most consistent with primary bronchogenic carcinoma, with widespread metastatic disease involving liver, pancreas and adrenal gland, widespread skeletal metastasis. Patient expressing wishes to focus her care on comfort rather than undergoing potentially burdensome/invasive procedures if there would be no change in end point.  -Palliative care consulted. Plan is for palliative radiation therapy which was started today  2. Chronic obstructive point disease -Stable from a respiratory standpoint  3. Pain management. -Patient reporting improvement to pain with the administration of IV steroids and tramadol.  -Palliative care consulted for assist with pain management- recommending Tramadol and oral steroids on discharge with PPI for GI protection  4. Goals of Care -She expressed wishes to not undergo further chemotherapy, rather focus her care on comfort and quality of life. Palliative Care consulted. She is a DO NOT RESUSCITATE. She would like to transition to skilled nursing facility.    Procedures: Radiation: Thoracolumbar spine (T10-L4) 3/22- Dr Lisbeth Renshaw  Consultants:  Radiation oncology  Medical oncology  Discharge Exam: [facility] Weights   03/12/15 2015  Weight: 55.4 kg (122 lb 2.2 oz)   Filed Vitals:   03/16/15 0500  BP: 135/67  Pulse: 67  Temp: 98.3 F (36.8 C)  Resp: 16    General: AAO x 3, no distress Cardiovascular: RRR, no murmurs   Respiratory: clear to auscultation bilaterally GI: soft, non-tender, non-distended, bowel sound positive  Discharge Instructions You were cared for by a hospitalist during your hospital stay. If you have any questions about your discharge medications or the care you received while you were in the hospital after you are discharged, you can call the unit and asked to speak with the hospitalist on call if the hospitalist that took care of you is not available. Once you are discharged, your primary care physician will handle any further medical issues. Please note that NO REFILLS for any discharge medications will be authorized once you are discharged, as it is imperative that you return to your primary care physician (or establish a relationship with a primary care physician if you do not have one) for your aftercare needs so that they can reassess your need for medications and monitor your lab values.      Discharge Instructions    Diet - low sodium heart healthy    Complete by:  As directed      Increase activity slowly    Complete by:  As directed             Medication List    STOP taking these medications        anastrozole 1 MG tablet  Commonly known as:  ARIMIDEX     cholecalciferol 1000 UNITS tablet  Commonly known as:  VITAMIN D     diphenhydrAMINE 12.5 MG/5ML elixir  Commonly known as:  BENADRYL     HYDROcodone-acetaminophen 7.5-325 MG per tablet  Commonly known as:  NORCO     methocarbamol 500 MG tablet  Commonly known as:  ROBAXIN      TAKE these medications        acetaminophen 500 MG tablet  Commonly known as:  TYLENOL  Take 500 mg by mouth every 6 (six) hours as needed for mild pain.     albuterol 108 (90 BASE) MCG/ACT inhaler  Commonly known as:  PROVENTIL HFA;VENTOLIN HFA  Inhale 2 puffs into the lungs every 4 (four) hours as needed for wheezing.     alum & mag hydroxide-simeth 200-200-20 MG/5ML suspension  Commonly known as:  MAALOX/MYLANTA  Take 30  mLs by mouth every 4 (four) hours as needed for indigestion.     dexamethasone 4 MG tablet  Commonly known as:  DECADRON  Take 0.5 tablets (2 mg total) by mouth 2 (two) times daily.     diphenhydrAMINE 25 MG tablet  Commonly known as:  SOMINEX  Take 25 mg by mouth at bedtime as needed for allergies or sleep.     DSS 100 MG Caps  Take 100 mg by mouth 2 (two) times daily.     ferrous sulfate 325 (65 FE) MG tablet  Take 1 tablet (325 mg total) by mouth 3 (three) times daily after meals.     guaiFENesin 600 MG 12 hr tablet  Commonly known as:  MUCINEX  Take 1 tablet (600 mg total) by mouth 2 (two) times daily as needed for to loosen phlegm.     ibuprofen 200 MG tablet  Commonly known as:  ADVIL,MOTRIN  Take 200 mg by mouth every 6 (six) hours as needed for mild pain or moderate pain.     menthol-cetylpyridinium 3 MG lozenge  Commonly known as:  CEPACOL  Take 1 lozenge (3 mg total) by mouth as needed for  sore throat (sore throat).     pantoprazole 40 MG tablet  Commonly known as:  PROTONIX  Take 1 tablet (40 mg total) by mouth daily.     polyethylene glycol packet  Commonly known as:  MIRALAX / GLYCOLAX  Take 17 g by mouth daily as needed for mild constipation.     senna 8.6 MG Tabs tablet  Commonly known as:  SENOKOT  Take 1 tablet (8.6 mg total) by mouth 2 (two) times daily.     traMADol 50 MG tablet  Commonly known as:  ULTRAM  Take 1 tablet (50 mg total) by mouth every 6 (six) hours as needed for severe pain.       Allergies  Allergen Reactions  . Celebrex [Celecoxib] Itching    Hands itch  . Hydrocodone Itching    Pt. states that she tolerates 1/2 to 1 tablet, but may need to take Benadryl if she takes 2 tablets.      The results of significant diagnostics from this hospitalization (including imaging, microbiology, ancillary and laboratory) are listed below for reference.    Significant Diagnostic Studies: Ct Chest W Contrast  03/13/2015   CLINICAL DATA:   81 year old female with a history of COPD, iron deficiency anemia, and left-sided breast cancer presents with one-week history of worsening back pain and lower extremity weakness. The patient states that she has had back pain "for years". She has had some injections in her back in January and February 2016 which have given her some temporary relief. However in the past week her pain has progressed to the point where she is having bilateral leg weakness and having much difficulty ambulating secondary to the pain. She denies any bowel or bladder incontinence. Because of her pain, she came to the emergency department. MRI of the lumbar spine revealed diffuse metastatic disease from T9 vertebra to L5. There is also a T10 pathologic fracture without neural impingement. In addition, there was extensive tumor in the spinal canal causing moderate to severe degree of thecal sac compression at L3. There was also liver and adrenal metastasis.  EXAM: CT CHEST, ABDOMEN, AND PELVIS WITH CONTRAST  TECHNIQUE: Multidetector CT imaging of the chest, abdomen and pelvis was performed following the standard protocol during bolus administration of intravenous contrast.  CONTRAST:  52mL OMNIPAQUE IOHEXOL 300 MG/ML SOLN, 164mL OMNIPAQUE IOHEXOL 300 MG/ML SOLN  COMPARISON:  MRI cervical spine 03/13/2015, MRI lumbar spine 03/12/2015  FINDINGS: CT CHEST FINDINGS  Mediastinum/Nodes: There is a left suprahilar mass surrounding and distorting the left main pulmonary artery. This mass measures approximately 5.2 x 5.8 x 5.2 cm. The mass extends into the AP window and central mediastinum to the level of the mid trachea. The mass surrounds and constricts the left upper lobe bronchus. There is collapse of the left upper lobe.  There is no additional discrete mediastinal adenopathy. Prevascular node measures 9 mm is likely metastatic  The left lobe of thyroid gland is lobular extends into the mediastinum.  Lungs/Pleura: Left upper lobe collapse  related to the left supra hilar mass.  CT ABDOMEN AND PELVIS FINDINGS  Hepatobiliary: Widespread hepatic metastasis with round low-density enhancing lesions. There approximately 30 lesions within the liver. Example lesion in the left hepatic lobe measures 2.5 cm. Example lesion in the right hepatic lobe measures 3.0 cm (image 57, series 2)  There is bulky periportal metastatic necrotic lymphadenopathy.  Pancreas: There is alesion in the pancreatic head measuring 19 mm (image 66, series 2). No pancreatic duct dilatation  dilatation.  Spleen: Normal spleen  Adrenals/Urinary Tract: Right adrenal gland metastasis measures 2.2 x 3.1 cm. Low-density lesions of the left renal cortex. No renal obstruction. Bladder is difficult to evaluate due to bilateral hip prosthetics.  Stomach/Bowel: The stomach, small bowel, appendix, and colon are normal.  Vascular/Lymphatic: The aorta normal caliber. There is no retroperitoneal adenopathy. Extensive periportal lymphadenopathy.  Reproductive: Uterus is small. Difficult to evaluate the pelvis due to streak artifact from the hip prosthetics. No clear pelvic adenopathy.  Other: No peritoneal carcinomatosis.  Musculoskeletal: Widespread skeletal metastasis with multiple lytic and sclerotic lesions within the iliac bones, sacrum, and throughout the spine. Epidural tumor at L 3 is much better depicted on MRI.  IMPRESSION: Chest Impression:  1. Large left suprahilar mass constricting the left main pulmonary artery and left upper lobe bronchus and extending into the mediastinum is most consistent with primary bronchogenic carcinoma. 2. Postobstructive collapse of the left upper lobe.  Abdomen / Pelvis Impression:  1. Widespread hepatic metastasis. 2. Multiple enlarged necrotic periportal metastatic lymph nodes. 3. Right adrenal gland metastasis. 4. Lesion within the head of the pancreas. Favor pancreatic metastasis. 5. Widespread skeletal metastasis throughout the spine and pelvis. 6. Epidural  tumor involving the neural arch of L3 is better depicted on comparison MRI 03/12/2015.   Electronically Signed   By: Suzy Bouchard M.D.   On: 03/13/2015 18:26   Mr Cervical Spine W Wo Contrast  03/13/2015   CLINICAL DATA:  Breast cancer with new lower extremity weakness. Initial encounter.  EXAM: MRI CERVICAL AND THORACIC SPINE WITHOUT AND WITH CONTRAST  TECHNIQUE: Multiplanar and multiecho pulse sequences of the cervical spine, to include the craniocervical junction and cervicothoracic junction, and lumbar spine, were obtained without and with intravenous contrast.  CONTRAST:  85mL MULTIHANCE GADOBENATE DIMEGLUMINE 529 MG/ML IV SOLN  COMPARISON:  MRI LUMBAR SPINE REPORTED SEPARATELY 03/12/2015.  FINDINGS: MRI CERVICAL SPINE FINDINGS  The patient was unable to remain motionless for the exam. Small or subtle lesions could be overlooked.  Metastatic lesions are seen throughout all cervical vertebrae. There is complete replacement at the C6 and C7 vertebral bodies, at risk for pathologic fracture. No epidural tumor. Minor cervical disc disease. Craniocervical junction unremarkable.  Limited visualization of the intracranial compartment shows multiple cerebellar metastases which appear intra-axial. There may be subarachnoid spread of tumor along the dorsal aspect of the cerebellum. There appears to be pachymeningeal thickening of the dura ventral to the pons. The clivus is abnormal with tumor replacement. Suspected calvarial metastases. MRI brain imaging is recommended for further evaluation.  MRI THORACIC SPINE FINDINGS  Numbering of the thoracic spine was confirmed by counting down from the odontoid.  Widespread metastatic disease is seen throughout all thoracic vertebrae. There is early pathologic compression of the T10 vertebral body. There is minimal posterior protrusion of tumor into the ventral epidural space but no significant cord compression. No epidural tumor at any location in the thoracic spine. No  thoracic disc protrusion.  Redemonstrated is an intramedullary metastasis opposite the T12 vertebral body. This was better visualized on prior MR from yesterday. Subarachnoid spread of tumor over the dorsal conus was better visualized also on yesterday's exam. There are subtle areas on postcontrast imaging which suggests subarachnoid spread of tumor may be present over the mid to upper thoracic region, but unfortunately cannot be confirmed on postcontrast axial images. Areas of concern for subarachnoid coating of tumor along the cord are best seen on image 8 series 16 postcontrast sagittal.  No  paravertebral masses. LEFT upper lobe atelectatic change is observed,, with LEFT hilar mass, incompletely evaluated. Widespread liver metastases. Suspected BILATERAL adrenal metastases. CT chest abdomen pelvis will be reported separately.  IMPRESSION: Widespread spinal metastatic disease without cervical or thoracic cord compression.  Intramedullary metastasis at T12 with suspected thoracic subarachnoid spread of tumor.  Pathologic compression fracture T10 with minimal retropulsion but no cord compression.  No significant epidural tumor is seen in the cervical or thoracic spinal canal.  Cerebellar metastases with clival, suspected calvarial, and possible dural invasion. MRI brain without and with contrast recommended.  Significant disease in the chest and abdomen is suspected. This will be better assessed on CT imaging.   Electronically Signed   By: Rolla Flatten M.D.   On: 03/13/2015 13:33   Mr Thoracic Spine W Wo Contrast  03/13/2015   CLINICAL DATA:  Breast cancer with new lower extremity weakness. Initial encounter.  EXAM: MRI CERVICAL AND THORACIC SPINE WITHOUT AND WITH CONTRAST  TECHNIQUE: Multiplanar and multiecho pulse sequences of the cervical spine, to include the craniocervical junction and cervicothoracic junction, and lumbar spine, were obtained without and with intravenous contrast.  CONTRAST:  75mL MULTIHANCE  GADOBENATE DIMEGLUMINE 529 MG/ML IV SOLN  COMPARISON:  MRI LUMBAR SPINE REPORTED SEPARATELY 03/12/2015.  FINDINGS: MRI CERVICAL SPINE FINDINGS  The patient was unable to remain motionless for the exam. Small or subtle lesions could be overlooked.  Metastatic lesions are seen throughout all cervical vertebrae. There is complete replacement at the C6 and C7 vertebral bodies, at risk for pathologic fracture. No epidural tumor. Minor cervical disc disease. Craniocervical junction unremarkable.  Limited visualization of the intracranial compartment shows multiple cerebellar metastases which appear intra-axial. There may be subarachnoid spread of tumor along the dorsal aspect of the cerebellum. There appears to be pachymeningeal thickening of the dura ventral to the pons. The clivus is abnormal with tumor replacement. Suspected calvarial metastases. MRI brain imaging is recommended for further evaluation.  MRI THORACIC SPINE FINDINGS  Numbering of the thoracic spine was confirmed by counting down from the odontoid.  Widespread metastatic disease is seen throughout all thoracic vertebrae. There is early pathologic compression of the T10 vertebral body. There is minimal posterior protrusion of tumor into the ventral epidural space but no significant cord compression. No epidural tumor at any location in the thoracic spine. No thoracic disc protrusion.  Redemonstrated is an intramedullary metastasis opposite the T12 vertebral body. This was better visualized on prior MR from yesterday. Subarachnoid spread of tumor over the dorsal conus was better visualized also on yesterday's exam. There are subtle areas on postcontrast imaging which suggests subarachnoid spread of tumor may be present over the mid to upper thoracic region, but unfortunately cannot be confirmed on postcontrast axial images. Areas of concern for subarachnoid coating of tumor along the cord are best seen on image 8 series 16 postcontrast sagittal.  No  paravertebral masses. LEFT upper lobe atelectatic change is observed,, with LEFT hilar mass, incompletely evaluated. Widespread liver metastases. Suspected BILATERAL adrenal metastases. CT chest abdomen pelvis will be reported separately.  IMPRESSION: Widespread spinal metastatic disease without cervical or thoracic cord compression.  Intramedullary metastasis at T12 with suspected thoracic subarachnoid spread of tumor.  Pathologic compression fracture T10 with minimal retropulsion but no cord compression.  No significant epidural tumor is seen in the cervical or thoracic spinal canal.  Cerebellar metastases with clival, suspected calvarial, and possible dural invasion. MRI brain without and with contrast recommended.  Significant disease in the chest  and abdomen is suspected. This will be better assessed on CT imaging.   Electronically Signed   By: Rolla Flatten M.D.   On: 03/13/2015 13:33   Mr Lumbar Spine W Wo Contrast  03/12/2015   CLINICAL DATA:  Low back pain with lower extremity weakness. Sciatica.  EXAM: MRI LUMBAR SPINE WITHOUT AND WITH CONTRAST  TECHNIQUE: Multiplanar and multiecho pulse sequences of the lumbar spine were obtained without and with intravenous contrast.  CONTRAST:  47mL MULTIHANCE GADOBENATE DIMEGLUMINE 529 MG/ML IV SOLN  COMPARISON:  MRI dated 07/30/2009  FINDINGS: The conus tip at L1-2. There are numerous metastatic lesions in the liver. Both adrenal glands are enlarged, right more than left consistent with metastatic disease.  All of the visualized vertebral from T9 through L5 as well as the entire sacrum and over the visualized pelvic bones are riddled with metastatic lesions. There is a pathologic compression fracture of T10. There is no neural impingement at T10.  There is an enhancing 5 mm intradural intramedullary mass in the conus at L1. There are 2 small intradural extramedullary nodules posterior to the conus at L1.  Tumor essentially replaces the L4 vertebral body. The  posterior elements of L3 are completely replaced by tumor and tumor does extend into the posterior aspect of the spinal canal and compress the thecal sac at that the L3-4 level.  There is diffuse degenerative disc disease in the lumbar spine without disc protrusions. Slight retrolisthesis of L1 on L2, L2 on L3, and L3 on L4.  Moderate right foraminal stenosis at L4-5 and severe bilateral foraminal stenosis at L5-S1, right greater than left.  IMPRESSION: 1. Diffuse metastatic tumor throughout the entire visualized portion of the spine as well as the sacrum and visualized pelvic bones. 2. Pathologic fracture of T10 without neural impingement. 3. Extension of tumor into the spinal canal from the posterior elements of L3 with compression of the thecal sac to a moderately severe degree at L3-4. 4. Intradural intramedullary and extramedullary metastases in the thecal sac at L1. 5. Extensive metastatic disease in the liver. Probable metastatic disease in the adrenal glands.   Electronically Signed   By: Lorriane Shire M.D.   On: 03/12/2015 14:28   Ct Abdomen Pelvis W Contrast  03/13/2015   CLINICAL DATA:  81 year old female with a history of COPD, iron deficiency anemia, and left-sided breast cancer presents with one-week history of worsening back pain and lower extremity weakness. The patient states that she has had back pain "for years". She has had some injections in her back in January and February 2016 which have given her some temporary relief. However in the past week her pain has progressed to the point where she is having bilateral leg weakness and having much difficulty ambulating secondary to the pain. She denies any bowel or bladder incontinence. Because of her pain, she came to the emergency department. MRI of the lumbar spine revealed diffuse metastatic disease from T9 vertebra to L5. There is also a T10 pathologic fracture without neural impingement. In addition, there was extensive tumor in the spinal  canal causing moderate to severe degree of thecal sac compression at L3. There was also liver and adrenal metastasis.  EXAM: CT CHEST, ABDOMEN, AND PELVIS WITH CONTRAST  TECHNIQUE: Multidetector CT imaging of the chest, abdomen and pelvis was performed following the standard protocol during bolus administration of intravenous contrast.  CONTRAST:  32mL OMNIPAQUE IOHEXOL 300 MG/ML SOLN, 188mL OMNIPAQUE IOHEXOL 300 MG/ML SOLN  COMPARISON:  MRI cervical  spine 03/13/2015, MRI lumbar spine 03/12/2015  FINDINGS: CT CHEST FINDINGS  Mediastinum/Nodes: There is a left suprahilar mass surrounding and distorting the left main pulmonary artery. This mass measures approximately 5.2 x 5.8 x 5.2 cm. The mass extends into the AP window and central mediastinum to the level of the mid trachea. The mass surrounds and constricts the left upper lobe bronchus. There is collapse of the left upper lobe.  There is no additional discrete mediastinal adenopathy. Prevascular node measures 9 mm is likely metastatic  The left lobe of thyroid gland is lobular extends into the mediastinum.  Lungs/Pleura: Left upper lobe collapse related to the left supra hilar mass.  CT ABDOMEN AND PELVIS FINDINGS  Hepatobiliary: Widespread hepatic metastasis with round low-density enhancing lesions. There approximately 30 lesions within the liver. Example lesion in the left hepatic lobe measures 2.5 cm. Example lesion in the right hepatic lobe measures 3.0 cm (image 57, series 2)  There is bulky periportal metastatic necrotic lymphadenopathy.  Pancreas: There is alesion in the pancreatic head measuring 19 mm (image 66, series 2). No pancreatic duct dilatation dilatation.  Spleen: Normal spleen  Adrenals/Urinary Tract: Right adrenal gland metastasis measures 2.2 x 3.1 cm. Low-density lesions of the left renal cortex. No renal obstruction. Bladder is difficult to evaluate due to bilateral hip prosthetics.  Stomach/Bowel: The stomach, small bowel, appendix, and  colon are normal.  Vascular/Lymphatic: The aorta normal caliber. There is no retroperitoneal adenopathy. Extensive periportal lymphadenopathy.  Reproductive: Uterus is small. Difficult to evaluate the pelvis due to streak artifact from the hip prosthetics. No clear pelvic adenopathy.  Other: No peritoneal carcinomatosis.  Musculoskeletal: Widespread skeletal metastasis with multiple lytic and sclerotic lesions within the iliac bones, sacrum, and throughout the spine. Epidural tumor at L 3 is much better depicted on MRI.  IMPRESSION: Chest Impression:  1. Large left suprahilar mass constricting the left main pulmonary artery and left upper lobe bronchus and extending into the mediastinum is most consistent with primary bronchogenic carcinoma. 2. Postobstructive collapse of the left upper lobe.  Abdomen / Pelvis Impression:  1. Widespread hepatic metastasis. 2. Multiple enlarged necrotic periportal metastatic lymph nodes. 3. Right adrenal gland metastasis. 4. Lesion within the head of the pancreas. Favor pancreatic metastasis. 5. Widespread skeletal metastasis throughout the spine and pelvis. 6. 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   Electronically Signed   By: Suzy Bouchard M.D.   On: 03/13/2015 18:26   Dg Chest Port 1 View  03/12/2015   CLINICAL DATA:  Patient states she came into the hospital with back pain. Patient denies any SOB, cough or chest pain at this time. Patient has a hx of COPD, asthma and breast cancer. Patient is a previous smoker and has been quit for 6 years and smoked a pack a day. Patient states she has had no surgeries to the heart or lungs.  Order comments: patient with metastatic cancer to spine with long hx of tobacco.  EXAM: PORTABLE CHEST - 1 VIEW  COMPARISON:  04/18/2013  FINDINGS: There is masslike fullness of the left hilum, new from the prior exam. Although this could potentially be vascular accentuated by technique, a mass  or adenopathy is of concern. Followup chest CT is indicated.  Cardiac silhouette is normal in size. No mediastinal or right hilar mass or evidence of adenopathy.  Lungs are clear. No pleural effusion or pneumothorax. Surgical vascular clips project over the left breast shadow.  Bony  thorax is demineralized.  IMPRESSION: 1. Left hilar fullness which may reflect a mass or adenopathy. Followup chest CT with contrast is recommended. 2. No acute cardiopulmonary disease.   Electronically Signed   By: Lajean Manes M.D.   On: 03/12/2015 18:53    Microbiology: Recent Results (from the past 240 hour(s))  MRSA PCR Screening     Status: None   Collection Time: 03/13/15  5:08 AM  Result Value Ref Range Status   MRSA by PCR NEGATIVE NEGATIVE Final    Comment:        The GeneXpert MRSA Assay (FDA approved for NASAL specimens only), is one component of a comprehensive MRSA colonization surveillance program. It is not intended to diagnose MRSA infection nor to guide or monitor treatment for MRSA infections.      Labs: Basic Metabolic Panel:  Recent Labs Lab 03/12/15 1343 03/12/15 1435 03/13/15 0440 03/14/15 0430  NA 141  --  143 142  K 2.9*  --  3.8 3.8  CL 103  --  105 101  CO2  --   --  27 29  GLUCOSE 98  --  122* 156*  BUN 21  --  22 23  CREATININE 0.80 0.80 0.76 0.75  CALCIUM  --   --  9.2 9.4  MG  --   --  1.8  --    Liver Function Tests:  Recent Labs Lab 03/13/15 0440  AST 47*  ALT 65*  ALKPHOS 167*  BILITOT 0.8  PROT 6.8  ALBUMIN 3.4*   No results for input(s): LIPASE, AMYLASE in the last 168 hours. No results for input(s): AMMONIA in the last 168 hours. CBC:  Recent Labs Lab 03/12/15 1343 03/12/15 1426 03/13/15 0440 03/14/15 0430  WBC  --  11.1* 9.6 13.7*  NEUTROABS  --  7.9*  --   --   HGB 12.9 11.3* 11.4* 12.4  HCT 38.0 34.4* 34.8* 38.0  MCV  --  86.4 86.6 87.2  PLT  --  171 197 229   Cardiac Enzymes: No results for input(s): CKTOTAL, CKMB, CKMBINDEX,  TROPONINI in the last 168 hours. BNP: BNP (last 3 results) No results for input(s): BNP in the last 8760 hours.  ProBNP (last 3 results) No results for input(s): PROBNP in the last 8760 hours.  CBG: No results for input(s): GLUCAP in the last 168 hours.     SignedDebbe Odea, MD [facility] 03/16/2015, 12:28 PM

## 2015-03-16 NOTE — Progress Notes (Signed)
  Radiation Oncology         [phone] ________________________________  Name: Alexandra Snyder MRN: [id number]  Date: 03/15/2015  DOB: 1934-09-04  SIMULATION AND TREATMENT PLANNING NOTE  DIAGNOSIS:  Metastatic breast cancer  Site:  Thoracolumbar spine (T10-L4)  NARRATIVE:  The patient was brought to the [facility].  Identity was confirmed.  All relevant records and images related to the planned course of therapy were reviewed.   Written consent to proceed with treatment was confirmed which was freely given after reviewing the details related to the planned course of therapy had been reviewed with the patient.  Then, the patient was set-up in a stable reproducible  supine position for radiation therapy.  CT images were obtained.  Surface markings were placed.    Medically necessary complex treatment device(s) for immobilization:  Customized vac lock bag.   The CT images were loaded into the planning software.  Then the target and avoidance structures were contoured.  Treatment planning then occurred.  The radiation prescription was entered and confirmed.  A total of 2 complex treatment devices were fabricated which relate to the designed radiation treatment fields. Each of these customized fields/ complex treatment devices will be used on a daily basis during the radiation course. I have requested : 3-D conformal treatment plan. Dose volume histograms of the target region in addition to the spinal cord, left kidney, right kidney will be reviewed.   PLAN:  The patient will receive 30 Gy in 10 fractions.  ________________________________   Jodelle Gross, MD, PhD

## 2015-03-16 NOTE — Care Management Note (Signed)
CARE MANAGEMENT NOTE 03/16/2015  Patient:  Alexandra Snyder, Alexandra Snyder   Account Number:  [id number]  Date Initiated:  03/16/2015  Documentation initiated by:  Marney Doctor  Subjective/Objective Assessment:   81 yo admitted with back pain and leg weakness     Action/Plan:   From home alone   Anticipated DC Date:  03/16/2015   Anticipated DC Plan:  SKILLED NURSING FACILITY  In-house referral  Clinical Social Worker      DC Planning Services  CM consult      Choice offered to / List presented to:             Status of service:  In process, will continue to follow Medicare Important Message given?   (If response is "NO", the following Medicare IM given date fields will be blank) Date Medicare IM given:   Medicare IM given by:   Date Additional Medicare IM given:   Additional Medicare IM given by:    Discharge Disposition:    Per UR Regulation:  Reviewed for med. necessity/level of care/duration of stay  If discussed at [place] of Stay Meetings, dates discussed:    Comments:  03/16/15 Marney Doctor RN,BSN,NCM [phone] Pt to DC to [place]. No CM needs noted.

## 2015-03-16 NOTE — Progress Notes (Signed)
Patient was stable at time of discharge. Gave report to EMTs. Patient left with her belongings.

## 2015-03-16 NOTE — Progress Notes (Signed)
Reported off to [facility] manager at [facility], [place].

## 2015-03-16 NOTE — [facility] Oncology Navigator Note (Signed)
I visited patient as she was preparing for transfer to [facility].  She reports that she is at peace with her decisions about not having chemo.  She is worried about having pain.  I reassured her that her physicians and palliative care will do whatever they can to manage her pain.  At this time her pain is being managed well.  She walked with assistance to the bathroom and tolerated it well.  She asked me to cancel her appointment with [facility].  I gave her my contact information and encouraged her to call me for any needs.

## 2015-03-16 NOTE — Progress Notes (Signed)
I spoke to the patient about her overall situation. She does wish to proceed with palliative radiation treatment. The patient's MRI scan of the cervical spine did have some significant findings. In particular, multiple metastases were seen within the cerebellum. A brain MRI scan was recommended given these findings. I spoke to the patient about how aggressive she would like to be at this point. We discussed the possibility of whole brain radiation treatment versus a more conservative approach. She stated that she would like to proceed with further workup in terms of the brain and also would like to proceed with palliative brain radiation treatment.  -Recommend brain MRI scan -The patient will undergo simulation tomorrow for planning for an anticipated course of whole brain radiation treatment. I will plan to treat her with a two-week course of treatment. This will begin as soon as possible, overlapping with her current palliative radiation treatment course. No plans to treat the patient aggressively further after these treatments designed to improve her quality of life.  ------------------------------------------------  Jodelle Gross, MD, PhD

## 2015-03-17 ENCOUNTER — Ambulatory Visit
Admit: 2015-03-17 | Discharge: 2015-03-17 | Disposition: A | Discharge status: Home / Self Care | Payer: Medicare Other | Attending: Radiation Oncology | Admitting: Radiation Oncology

## 2015-03-17 ENCOUNTER — Ambulatory Visit
Admission: RE | Admit: 2015-03-17 | Discharge: 2015-03-17 | Disposition: A | Discharge status: Home / Self Care | Payer: Medicare Other | Source: Ambulatory Visit | Attending: Radiation Oncology | Admitting: Radiation Oncology

## 2015-03-17 ENCOUNTER — Encounter: Payer: Self-pay | Admitting: *Deleted

## 2015-03-17 DIAGNOSIS — C7951 Secondary malignant neoplasm of bone: Secondary | ICD-10-CM | POA: Diagnosis not present

## 2015-03-17 DIAGNOSIS — C50412 Malignant neoplasm of upper-outer quadrant of left female breast: Secondary | ICD-10-CM | POA: Diagnosis not present

## 2015-03-17 NOTE — [facility] Oncology Navigator Note (Signed)
Patient at [facility] for radiation therapy treatment.  She reports that she is doing OK and that her first night at the nursing home also went well.  She reports that she can tell that the strength in her legs is improving since she is now able to stand at the sink to wash her hands.  She denies any questions or concerns at this time.  I encouraged her to call for any assistance she needs.

## 2015-03-18 ENCOUNTER — Ambulatory Visit: Payer: Medicare Other | Admitting: Hematology

## 2015-03-18 ENCOUNTER — Ambulatory Visit
Admission: RE | Admit: 2015-03-18 | Discharge: 2015-03-18 | Disposition: A | Discharge status: Home / Self Care | Payer: Medicare Other | Source: Ambulatory Visit | Attending: Radiation Oncology | Admitting: Radiation Oncology

## 2015-03-18 ENCOUNTER — Other Ambulatory Visit: Payer: Medicare Other

## 2015-03-18 DIAGNOSIS — C50412 Malignant neoplasm of upper-outer quadrant of left female breast: Secondary | ICD-10-CM | POA: Diagnosis not present

## 2015-03-19 ENCOUNTER — Ambulatory Visit: Admit: 2015-03-19 | Payer: Medicare Other | Admitting: Radiation Oncology

## 2015-03-19 ENCOUNTER — Ambulatory Visit
Admission: RE | Admit: 2015-03-19 | Discharge: 2015-03-19 | Disposition: A | Discharge status: Home / Self Care | Payer: Medicare Other | Source: Ambulatory Visit | Attending: Radiation Oncology | Admitting: Radiation Oncology

## 2015-03-19 ENCOUNTER — Telehealth: Payer: Self-pay | Admitting: *Deleted

## 2015-03-19 ENCOUNTER — Ambulatory Visit: Payer: Medicare Other

## 2015-03-19 NOTE — Telephone Encounter (Signed)
Kelly,RN from [facility] called left voice message patient cancelling today, returned call  And spoke with [facility], pt c/o indigestion  After leaving treatment yesterday and became nausea as well, patient does have protonix, went over side effects with Alexandra Snyder, Patient was given order for xanax bid and prn and MMW for 1 week by their MD at [place], asked Alexandra Snyder if she would see if patient willing to come today if given her xanax right befopre leaving for her 1 pm appt, Alexandra Snyder spoke with the pateint and patient again refused, too anxious and will try to rest over the weekend and will give patient her Xanax right before leaving [facility] MOnday for treatment , thanked Alexandra Snyder and will infomr Dr. Lisbeth Renshaw and [place]#2 9:07 AM

## 2015-03-22 ENCOUNTER — Ambulatory Visit
Admit: 2015-03-22 | Discharge: 2015-03-22 | Disposition: A | Discharge status: Home / Self Care | Payer: Medicare Other | Attending: Radiation Oncology | Admitting: Radiation Oncology

## 2015-03-22 ENCOUNTER — Ambulatory Visit
Admission: RE | Admit: 2015-03-22 | Discharge: 2015-03-22 | Disposition: A | Discharge status: Home / Self Care | Payer: Medicare Other | Source: Ambulatory Visit | Attending: Radiation Oncology | Admitting: Radiation Oncology

## 2015-03-22 DIAGNOSIS — Z923 Personal history of irradiation: Secondary | ICD-10-CM | POA: Insufficient documentation

## 2015-03-22 DIAGNOSIS — C7951 Secondary malignant neoplasm of bone: Secondary | ICD-10-CM | POA: Diagnosis present

## 2015-03-22 DIAGNOSIS — Z853 Personal history of malignant neoplasm of breast: Secondary | ICD-10-CM | POA: Insufficient documentation

## 2015-03-22 DIAGNOSIS — C801 Malignant (primary) neoplasm, unspecified: Secondary | ICD-10-CM | POA: Diagnosis not present

## 2015-03-22 DIAGNOSIS — C50412 Malignant neoplasm of upper-outer quadrant of left female breast: Secondary | ICD-10-CM | POA: Diagnosis not present

## 2015-03-22 MED ORDER — BIAFINE EX EMUL
Freq: Every day | CUTANEOUS | Status: DC
  Administered 2015-03-22: 15:00:00 via TOPICAL

## 2015-03-22 NOTE — Progress Notes (Signed)
Pt education, radiation therapy and you book, biafine cream, my business card given, increase protein in diet, ways to manage side effects discussed, loss hair on treated areas, skin irritation fatigue, nausea,vomiting, throat changes, may need To eat 5-6 smaller meals with snacks, instead of 3 large meals, soft diet, pain control, fatigue, stay hydrated, need for nausea meds prn, use biafine cream to affected rad treatments daily after rad ttxs, and prn, all questions answered, teach back,patient resideing at [facility] facility at present,

## 2015-03-23 ENCOUNTER — Ambulatory Visit
Admit: 2015-03-23 | Discharge: 2015-03-23 | Disposition: A | Discharge status: Home / Self Care | Payer: Medicare Other | Attending: Radiation Oncology | Admitting: Radiation Oncology

## 2015-03-23 ENCOUNTER — Ambulatory Visit
Admission: RE | Admit: 2015-03-23 | Discharge: 2015-03-23 | Disposition: A | Discharge status: Home / Self Care | Payer: Medicare Other | Source: Ambulatory Visit | Attending: Family | Admitting: Family

## 2015-03-23 DIAGNOSIS — R946 Abnormal results of thyroid function studies: Secondary | ICD-10-CM

## 2015-03-23 DIAGNOSIS — C50412 Malignant neoplasm of upper-outer quadrant of left female breast: Secondary | ICD-10-CM | POA: Diagnosis not present

## 2015-03-24 ENCOUNTER — Encounter: Payer: Self-pay | Admitting: Radiation Oncology

## 2015-03-24 ENCOUNTER — Ambulatory Visit
Admission: RE | Admit: 2015-03-24 | Discharge: 2015-03-24 | Disposition: A | Discharge status: Home / Self Care | Payer: Medicare Other | Source: Ambulatory Visit | Attending: Radiation Oncology | Admitting: Radiation Oncology

## 2015-03-24 ENCOUNTER — Ambulatory Visit
Admit: 2015-03-24 | Discharge: 2015-03-24 | Disposition: A | Discharge status: Home / Self Care | Payer: Medicare Other | Attending: Radiation Oncology | Admitting: Radiation Oncology

## 2015-03-24 VITALS — BP 130/78 | HR 110 | Temp 97.3°F | Resp 20

## 2015-03-24 DIAGNOSIS — C7951 Secondary malignant neoplasm of bone: Secondary | ICD-10-CM

## 2015-03-24 DIAGNOSIS — C50412 Malignant neoplasm of upper-outer quadrant of left female breast: Secondary | ICD-10-CM | POA: Diagnosis not present

## 2015-03-24 MED ORDER — LIDOCAINE VISCOUS 2 % MT SOLN: OROMUCOSAL | Status: AC

## 2015-03-24 MED ORDER — SUCRALFATE 1 G PO TABS: ORAL_TABLET | ORAL | Status: AC

## 2015-03-24 NOTE — Progress Notes (Signed)
weekly rad txs, ,C5-T1,T10-L4 put, 7/13 compleetd, in w/c, pain in mid and lower back 3/4 on 10 scale did take her pain med this am,  Residing at [facility], , no nausea, appetite good, no difficulty swallowing food/fluids, not  Using radiaplex as yet, no skin changes, no pain, fatigued 11:21 AM

## 2015-03-24 NOTE — Progress Notes (Signed)
Department of Radiation Oncology  Phone:  [phone] Fax:        [phone]  Weekly Treatment Note    Name: Alexandra Snyder Date: 03/24/2015 MRN: [id number] DOB: 06-Jan-1934   Current dose: 21 Gy /7 fractions to T spine, 12 Gy/4 fractions  to C spine, 12 Gy/4 fractions  to WB     ICD-9-CM ICD-10-CM   1. Metastatic cancer to spine 198.5 C79.51 sucralfate (CARAFATE) 1 G tablet     lidocaine (XYLOCAINE) 2 % solution  2. Cancer of upper-outer quadrant of female breast, left 174.4 C50.412      MEDICATIONS: Current Outpatient Prescriptions  Medication Sig Dispense Refill  . acetaminophen (TYLENOL) 500 MG tablet Take 500 mg by mouth every 6 (six) hours as needed for mild pain.    Marland Kitchen albuterol (PROVENTIL HFA;VENTOLIN HFA) 108 (90 BASE) MCG/ACT inhaler Inhale 2 puffs into the lungs every 4 (four) hours as needed for wheezing.     Marland Kitchen alum & mag hydroxide-simeth (MAALOX/MYLANTA) 200-200-20 MG/5ML suspension Take 30 mLs by mouth every 4 (four) hours as needed for indigestion. 355 mL 0  . amLODipine (NORVASC) 10 MG tablet Take 10 mg by mouth daily.    Marland Kitchen dexamethasone (DECADRON) 4 MG tablet Take 0.5 tablets (2 mg total) by mouth 2 (two) times daily.    . diphenhydrAMINE (SOMINEX) 25 MG tablet Take 25 mg by mouth at bedtime as needed for allergies or sleep.    Marland Kitchen docusate sodium 100 MG CAPS Take 100 mg by mouth 2 (two) times daily. 10 capsule 0  . emollient (BIAFINE) cream Apply 1 application topically as needed. Apply to treated areas after radiation daily and prn, just not 4 hours prior to coming for rad txs    . ferrous sulfate 325 (65 FE) MG tablet Take 1 tablet (325 mg total) by mouth 3 (three) times daily after meals. 45 tablet 0  . guaiFENesin (MUCINEX) 600 MG 12 hr tablet Take 1 tablet (600 mg total) by mouth 2 (two) times daily as needed for to loosen phlegm. 30 tablet 0  . ibuprofen (ADVIL,MOTRIN) 200 MG tablet Take 200 mg by mouth every 6 (six) hours as needed for mild pain or moderate  pain.    Marland Kitchen menthol-cetylpyridinium (CEPACOL) 3 MG lozenge Take 1 lozenge (3 mg total) by mouth as needed for sore throat (sore throat). 100 tablet 12  . pantoprazole (PROTONIX) 40 MG tablet Take 1 tablet (40 mg total) by mouth daily.    . polyethylene glycol (MIRALAX / GLYCOLAX) packet Take 17 g by mouth daily as needed for mild constipation. 14 each 0  . senna (SENOKOT) 8.6 MG TABS tablet Take 1 tablet (8.6 mg total) by mouth 2 (two) times daily. 120 each 0  . traMADol (ULTRAM) 50 MG tablet Take 1 tablet (50 mg total) by mouth every 6 (six) hours as needed for severe pain. 30 tablet 0  . anastrozole (ARIMIDEX) 1 MG tablet Take 1 mg by mouth daily.    Marland Kitchen lidocaine (XYLOCAINE) 2 % solution Patient: Mix 1part 2% viscous lidocaine, 1part H20. Swish and/or swallow 75mL of this mixture, up to QID for sore throat. May use 70min before meals and at bedtime, or in between meals at any time. 100 mL 1  . sucralfate (CARAFATE) 1 G tablet Dissolve 1 tablet in 10 mL H20 and swallow 30 min prior to meals and bedtime PRN soreness in throat. 60 tablet 1   No current facility-administered medications for this encounter.  ALLERGIES: Celebrex and Hydrocodone   LABORATORY DATA:  Lab Results  Component Value Date   WBC 13.7* 03/14/2015   HGB 12.4 03/14/2015   HCT 38.0 03/14/2015   MCV 87.2 03/14/2015   PLT 229 03/14/2015   Lab Results  Component Value Date   NA 142 03/14/2015   K 3.8 03/14/2015   CL 101 03/14/2015   CO2 29 03/14/2015   Lab Results  Component Value Date   ALT 65* 03/13/2015   AST 47* 03/13/2015   ALKPHOS 167* 03/13/2015   BILITOT 0.8 03/13/2015     NARRATIVE: Alexandra Snyder was seen today for weekly treatment management. The chart was checked and the patient's films were reviewed.  The patient states she has done fine with her treatment so far. She was Walking with her walker this AM at nursing home. Early soreness/signs of esophagitis with swallowing.  PHYSICAL EXAMINATION:  Alert, no acute distress  No skin irritation over chest      ASSESSMENT: The patient is doing satisfactorily with treatment.  PLAN: We will continue with the patient's radiation treatment as planned.   Sucralfate and lidocaine Rx for esophagitis. Note written to nusing home to not fill lidocaine if it is already in her magic mouthwash.   -----------------------------------  Eppie Gibson, MD

## 2015-03-25 ENCOUNTER — Encounter: Payer: Self-pay | Admitting: *Deleted

## 2015-03-25 ENCOUNTER — Ambulatory Visit
Admit: 2015-03-25 | Discharge: 2015-03-25 | Disposition: A | Discharge status: Home / Self Care | Payer: Medicare Other | Attending: Radiation Oncology | Admitting: Radiation Oncology

## 2015-03-25 DIAGNOSIS — C50412 Malignant neoplasm of upper-outer quadrant of left female breast: Secondary | ICD-10-CM | POA: Diagnosis not present

## 2015-03-25 NOTE — [facility] Oncology Navigator Note (Signed)
Patient at [facility] for radiation treatment.  I checked in with patient who reports that she is doing OK.  She is adjusting to the nursing home and is pleased with her care.  She is receiving physical therapy to help with her upper and lower extremity strength.  She denied any questions or concerns at this time.  I encouraged her to call me for any needs.

## 2015-03-26 ENCOUNTER — Ambulatory Visit
Admission: RE | Admit: 2015-03-26 | Discharge: 2015-03-26 | Disposition: A | Discharge status: Home / Self Care | Payer: Medicare Other | Source: Ambulatory Visit | Attending: Radiation Oncology | Admitting: Radiation Oncology

## 2015-03-26 DIAGNOSIS — C50412 Malignant neoplasm of upper-outer quadrant of left female breast: Secondary | ICD-10-CM | POA: Diagnosis not present

## 2015-03-29 ENCOUNTER — Encounter: Payer: Self-pay | Admitting: *Deleted

## 2015-03-29 ENCOUNTER — Ambulatory Visit
Admission: RE | Admit: 2015-03-29 | Discharge: 2015-03-29 | Disposition: A | Discharge status: Home / Self Care | Payer: Medicare Other | Source: Ambulatory Visit | Attending: Radiation Oncology | Admitting: Radiation Oncology

## 2015-03-29 DIAGNOSIS — C50412 Malignant neoplasm of upper-outer quadrant of left female breast: Secondary | ICD-10-CM | POA: Diagnosis not present

## 2015-03-29 NOTE — [facility] Oncology Navigator Note (Signed)
Patient at [facility] for radiation treatment.  Patient reports that she is doing OK.  She just feels very tired.  Her nephew came to see her this weekend and she was able to discuss her legal matters and other personal concerns.  She continues to be adjusting to living at the nursing home.  She denies any questions or concerns at this time.  I encouraged her to contact me for any needs.

## 2015-03-30 ENCOUNTER — Ambulatory Visit
Admission: RE | Admit: 2015-03-30 | Discharge: 2015-03-30 | Disposition: A | Discharge status: Home / Self Care | Payer: Medicare Other | Source: Ambulatory Visit | Attending: Radiation Oncology | Admitting: Radiation Oncology

## 2015-03-30 DIAGNOSIS — C50412 Malignant neoplasm of upper-outer quadrant of left female breast: Secondary | ICD-10-CM | POA: Diagnosis not present

## 2015-03-31 ENCOUNTER — Ambulatory Visit
Admission: RE | Admit: 2015-03-31 | Discharge: 2015-03-31 | Disposition: A | Discharge status: Home / Self Care | Payer: Medicare Other | Source: Ambulatory Visit | Attending: Radiation Oncology | Admitting: Radiation Oncology

## 2015-03-31 ENCOUNTER — Encounter: Payer: Self-pay | Admitting: Radiation Oncology

## 2015-03-31 VITALS — BP 159/99 | HR 122 | Temp 97.8°F | Resp 22 | Wt 122.1 lb

## 2015-03-31 DIAGNOSIS — C50412 Malignant neoplasm of upper-outer quadrant of left female breast: Secondary | ICD-10-CM | POA: Diagnosis not present

## 2015-03-31 DIAGNOSIS — C7951 Secondary malignant neoplasm of bone: Secondary | ICD-10-CM

## 2015-03-31 NOTE — Progress Notes (Signed)
Weekly rad tx c-spine-T-spine 9/10 completed, has audible wheezing, sob,  Congestive cough, took her inhaler right before coming, offered room temp cola,  No pain or nausea, , sometimes when swallowing foods, feels like air pocket on right side, hurst when swallowing and sh has to cough that's when it hurts , instructed patient to make sure they are giving her her carafate before meals and bedtime, on printed list none checked off, patient resides at [facility] , in w/c, weakness unsteadiness 2:55 PM

## 2015-03-31 NOTE — Progress Notes (Signed)
Called [facility], [phone] EXT #[phone], patient can be picked up in lobby 3:28 PM

## 2015-03-31 NOTE — Progress Notes (Signed)
Department of Radiation Oncology  Phone:  [phone] Fax:        [phone]  Weekly Treatment Note    Name: Alexandra Snyder Date: 03/31/2015 MRN: [id number] DOB: December 24, 1934   Current dose: 27 Gy (c-spine/brain)  Current fraction:12   MEDICATIONS: Current Outpatient Prescriptions  Medication Sig Dispense Refill  . acetaminophen (TYLENOL) 500 MG tablet Take 500 mg by mouth every 6 (six) hours as needed for mild pain.    Marland Kitchen albuterol (PROVENTIL HFA;VENTOLIN HFA) 108 (90 BASE) MCG/ACT inhaler Inhale 2 puffs into the lungs every 4 (four) hours as needed for wheezing.     Marland Kitchen alum & mag hydroxide-simeth (MAALOX/MYLANTA) 200-200-20 MG/5ML suspension Take 30 mLs by mouth every 4 (four) hours as needed for indigestion. 355 mL 0  . amLODipine (NORVASC) 10 MG tablet Take 10 mg by mouth daily.    Marland Kitchen dexamethasone (DECADRON) 4 MG tablet Take 0.5 tablets (2 mg total) by mouth 2 (two) times daily.    . diphenhydrAMINE (SOMINEX) 25 MG tablet Take 25 mg by mouth at bedtime as needed for allergies or sleep.    Marland Kitchen docusate sodium 100 MG CAPS Take 100 mg by mouth 2 (two) times daily. 10 capsule 0  . emollient (BIAFINE) cream Apply 1 application topically as needed. Apply to treated areas after radiation daily and prn, just not 4 hours prior to coming for rad txs    . ferrous sulfate 325 (65 FE) MG tablet Take 1 tablet (325 mg total) by mouth 3 (three) times daily after meals. 45 tablet 0  . guaiFENesin (MUCINEX) 600 MG 12 hr tablet Take 1 tablet (600 mg total) by mouth 2 (two) times daily as needed for to loosen phlegm. 30 tablet 0  . ibuprofen (ADVIL,MOTRIN) 200 MG tablet Take 200 mg by mouth every 6 (six) hours as needed for mild pain or moderate pain.    Marland Kitchen lidocaine (XYLOCAINE) 2 % solution Patient: Mix 1part 2% viscous lidocaine, 1part H20. Swish and/or swallow 79mL of this mixture, up to QID for sore throat. May use 42min before meals and at bedtime, or in between meals at any time. 100 mL 1  .  menthol-cetylpyridinium (CEPACOL) 3 MG lozenge Take 1 lozenge (3 mg total) by mouth as needed for sore throat (sore throat). 100 tablet 12  . pantoprazole (PROTONIX) 40 MG tablet Take 1 tablet (40 mg total) by mouth daily.    . polyethylene glycol (MIRALAX / GLYCOLAX) packet Take 17 g by mouth daily as needed for mild constipation. 14 each 0  . senna (SENOKOT) 8.6 MG TABS tablet Take 1 tablet (8.6 mg total) by mouth 2 (two) times daily. 120 each 0  . sucralfate (CARAFATE) 1 G tablet Dissolve 1 tablet in 10 mL H20 and swallow 30 min prior to meals and bedtime PRN soreness in throat. 60 tablet 1  . traMADol (ULTRAM) 50 MG tablet Take 1 tablet (50 mg total) by mouth every 6 (six) hours as needed for severe pain. 30 tablet 0  . anastrozole (ARIMIDEX) 1 MG tablet Take 1 mg by mouth daily.     No current facility-administered medications for this encounter.     ALLERGIES: Celebrex and Hydrocodone   LABORATORY DATA:  Lab Results  Component Value Date   WBC 13.7* 03/14/2015   HGB 12.4 03/14/2015   HCT 38.0 03/14/2015   MCV 87.2 03/14/2015   PLT 229 03/14/2015   Lab Results  Component Value Date   NA 142 03/14/2015   K  3.8 03/14/2015   CL 101 03/14/2015   CO2 29 03/14/2015   Lab Results  Component Value Date   ALT 65* 03/13/2015   AST 47* 03/13/2015   ALKPHOS 167* 03/13/2015   BILITOT 0.8 03/13/2015     NARRATIVE: Alexandra Snyder was seen today for weekly treatment management. The chart was checked and the patient's films were reviewed.  Weekly rad tx c-spine-T-spine 9/10 completed, has audible wheezing, sob,  Congestive cough, took her inhaler right before coming, offered room temp cola,  No pain or nausea, , sometimes when swallowing foods, feels like air pocket on right side, hurst when swallowing and sh has to cough that's when it hurts , instructed patient to make sure they are giving her her carafate before meals and bedtime, on printed list none checked off, patient resides at  [facility] , in w/c, weakness unsteadiness   PHYSICAL EXAMINATION: weight is 122 lb 1.6 oz (55.384 kg). Her oral temperature is 97.8 F (36.6 C). Her blood pressure is 159/99 and her pulse is 122. Her respiration is 22 and oxygen saturation is 100%.        ASSESSMENT: The patient is doing satisfactorily with treatment.  PLAN: We will continue with the patient's radiation treatment as planned. The patient currently is on a fairly low dose of Decadron at 2 mg twice a day. I believe that it would be reasonable to continue this dose for the time being. We can attempt to further taper her dose subsequently.

## 2015-04-01 ENCOUNTER — Encounter: Payer: Self-pay | Admitting: Radiation Oncology

## 2015-04-01 ENCOUNTER — Ambulatory Visit
Admission: RE | Admit: 2015-04-01 | Discharge: 2015-04-01 | Disposition: A | Discharge status: Home / Self Care | Payer: Medicare Other | Source: Ambulatory Visit | Attending: Radiation Oncology | Admitting: Radiation Oncology

## 2015-04-01 ENCOUNTER — Other Ambulatory Visit: Payer: Self-pay | Admitting: Oncology

## 2015-04-01 ENCOUNTER — Encounter: Payer: Self-pay | Admitting: *Deleted

## 2015-04-01 DIAGNOSIS — C50412 Malignant neoplasm of upper-outer quadrant of left female breast: Secondary | ICD-10-CM | POA: Diagnosis not present

## 2015-04-01 NOTE — [facility] Oncology Navigator Note (Signed)
Patient at [facility] for her final radiation therapy treatment.  Patient reports that she is doing well.  She appears to feel better than her last visit.  She continues to have some fatigue.  She reports that she is continuing to work with the Physical Therapist at the nursing home and is increasing her ability to walk.  We reviewed her next appointment for follow up.  She denied any questions or concerns at this time.  I encouraged her to call me for any needs.

## 2015-04-07 ENCOUNTER — Telehealth: Payer: Self-pay | Admitting: Hematology

## 2015-04-07 ENCOUNTER — Telehealth: Payer: Self-pay | Admitting: *Deleted

## 2015-04-07 NOTE — Telephone Encounter (Signed)
Received refill request from [facility] for anastrozole tablets. According to EMR, refill was sent 04/01/15. Patient missed scheduled appt with Dr. Burr Medico on 03/18/15 due to hospital admission. Appointment rescheduled for patient. Left VM requesting return phone call from patient to discuss new appts at [facility].

## 2015-04-07 NOTE — Telephone Encounter (Signed)
Follow up appointment made per kristin f and she will notify the patient and

## 2015-04-09 ENCOUNTER — Telehealth: Payer: Self-pay | Admitting: *Deleted

## 2015-04-09 NOTE — Telephone Encounter (Signed)
Follow up call made to pt regarding appts for 04/13/15. No answer. LV to call back to this office.

## 2015-04-13 ENCOUNTER — Telehealth: Payer: Self-pay | Admitting: Hematology

## 2015-04-13 ENCOUNTER — Telehealth: Payer: Self-pay | Admitting: *Deleted

## 2015-04-13 ENCOUNTER — Ambulatory Visit: Payer: Medicare Other | Admitting: Hematology

## 2015-04-13 ENCOUNTER — Other Ambulatory Visit: Payer: Medicare Other

## 2015-04-13 NOTE — Telephone Encounter (Signed)
Patient scheduled to see Dr. Burr Medico today.  I called patient to make sure she had received the message with her appointment and patient did not answer the call.  Patient is a resident at [facility] so I called and spoke to her nurse who said she would not be able to come to today's appointment and that she has become very weak and is declining rapidly.  I notified Dr. Ernestina Penna nurse to make her aware.

## 2015-04-13 NOTE — Telephone Encounter (Signed)
Left message to confirm appointment for 04/26. Mailed calendar.

## 2015-04-16 ENCOUNTER — Encounter: Payer: Self-pay | Admitting: Radiation Oncology

## 2015-04-16 NOTE — Progress Notes (Addendum)
  Radiation Oncology         (336) [phone] ________________________________  Name: Alexandra Snyder MRN: [id number]  Date: 04/01/2015  DOB: 03-Feb-1934  End of Treatment Note  Diagnosis:   Metastatic breast cancer     Indication for treatment:  palliative       Radiation treatment dates:   03/15/2015 through 04/01/2015  Site/dose:    1.  T1--L4 spine: 30 gray in 10 fractions using a 2 field technique 2.  Whole brain radiation treatment:  30 gray in 10 fractions using whole brain radiation treatment 3.  C5-T1 spine: 30 gray in 10 fractions using a 2 field technique    Narrative: The patient was able to complete her prescribed course of treatment to each of the 3 target areas without any substantial interruptions.  Plan: The patient has completed radiation treatment. The patient will return to radiation oncology clinic for routine followup in one month. I advised the patient to call or return sooner if they have any questions or concerns related to their recovery or treatment. ________________________________  Jodelle Gross, M.D., Ph.D.

## 2015-04-16 NOTE — Progress Notes (Signed)
  Radiation Oncology         (336) [phone] ________________________________  Name: Alexandra Snyder MRN: [id number]  Date: 03/17/2015  DOB: Apr 27, 1934  SIMULATION AND TREATMENT PLANNING NOTE  DIAGNOSIS:  Metastatic breast cancer  Site:   1.  Whole brain radiation treatment 2.  C5-T1 spine  NARRATIVE:  The patient was brought to the [facility].  Identity was confirmed.  All relevant records and images related to the planned course of therapy were reviewed.   Written consent to proceed with treatment was confirmed which was freely given after reviewing the details related to the planned course of therapy had been reviewed with the patient.  Then, the patient was set-up in a stable reproducible  supine position for radiation therapy.  CT images were obtained.  Surface markings were placed.    Medically necessary complex treatment device(s) for immobilization:  Customized thermoplastic head chest.   The CT images were loaded into the planning software.  Then the target and avoidance structures were contoured.  Treatment planning then occurred.  The radiation prescription was entered and confirmed.  A total of 6 complex treatment devices were fabricated which relate to the designed radiation treatment fields: 4 customized fields for the course of whole brain radiation treatment including a reduced field technique, and 2 fields to treat the spine . Each of these customized fields/ complex treatment devices will be used on a daily basis during the radiation course. I have requested : Isodose Plan.   PLAN:  The patient will receive 30  Gy in 10  Fractions to each of the target areas above.  ________________________________   Jodelle Gross, MD, PhD

## 2015-04-16 NOTE — Addendum Note (Signed)
Encounter addended by: Kyung Rudd, MD on: 04/16/2015 11:45 PM<BR>     Documentation filed: Notes Section, Visit Diagnoses

## 2015-04-20 ENCOUNTER — Other Ambulatory Visit: Payer: Self-pay | Admitting: *Deleted

## 2015-04-20 ENCOUNTER — Other Ambulatory Visit: Payer: Medicare Other

## 2015-04-20 ENCOUNTER — Ambulatory Visit: Payer: Medicare Other | Admitting: Hematology

## 2015-04-20 ENCOUNTER — Telehealth: Payer: Self-pay | Admitting: Hematology

## 2015-04-20 NOTE — Telephone Encounter (Signed)
lvm for pt regarding to r/s appt...advised to call us back to sched since she is at [place]

## 2015-04-25 DEATH — deceased

## 2015-04-27 ENCOUNTER — Telehealth: Payer: Self-pay | Admitting: *Deleted

## 2015-04-27 NOTE — Telephone Encounter (Signed)
Patient's [place], [place] [phone] called to say his mother died last week at [facility].  Notified Dr. Lisbeth Renshaw & nursing

## 2015-05-05 ENCOUNTER — Ambulatory Visit: Payer: Medicare Other | Admitting: Radiation Oncology

## 2015-05-25 ENCOUNTER — Telehealth: Payer: Self-pay | Admitting: *Deleted

## 2015-05-25 NOTE — Telephone Encounter (Signed)
Medical records ([facility]) notified.

## 2015-05-25 NOTE — Telephone Encounter (Signed)
VM message from [facility] stating that pt died on 05-14-2015. He just wanted to make sure Dr. Burr Medico knew this.

## 2015-05-26 DEATH — deceased
